# Patient Record
Sex: Male | Born: 1950 | Race: Black or African American | Hispanic: No | Marital: Married | Smoking: Never smoker
Health system: Southern US, Community
[De-identification: ages and names within clinical notes are randomized; demographics above are authoritative.]

## PROBLEM LIST (undated history)

## (undated) DIAGNOSIS — Z950 Presence of cardiac pacemaker: Secondary | ICD-10-CM

## (undated) DIAGNOSIS — Z95810 Presence of automatic (implantable) cardiac defibrillator: Secondary | ICD-10-CM

## (undated) DIAGNOSIS — I509 Heart failure, unspecified: Secondary | ICD-10-CM

## (undated) DIAGNOSIS — I428 Other cardiomyopathies: Secondary | ICD-10-CM

## (undated) DIAGNOSIS — U071 COVID-19: Secondary | ICD-10-CM

## (undated) DIAGNOSIS — I480 Paroxysmal atrial fibrillation: Secondary | ICD-10-CM

## (undated) DIAGNOSIS — J9611 Chronic respiratory failure with hypoxia: Secondary | ICD-10-CM

## (undated) DIAGNOSIS — N183 Chronic kidney disease, stage 3 unspecified: Secondary | ICD-10-CM

## (undated) DIAGNOSIS — I10 Essential (primary) hypertension: Secondary | ICD-10-CM

## (undated) DIAGNOSIS — G4733 Obstructive sleep apnea (adult) (pediatric): Secondary | ICD-10-CM

## (undated) HISTORY — PX: PACEMAKER IMPLANT: EP1218

---

## 2007-02-15 ENCOUNTER — Emergency Department (HOSPITAL_COMMUNITY): Admission: EM | Admit: 2007-02-15 | Discharge: 2007-02-15 | Payer: Self-pay | Admitting: Emergency Medicine

## 2011-03-13 LAB — CBC
Hemoglobin: 14.7
RBC: 5.61
RDW: 16 — ABNORMAL HIGH

## 2011-03-13 LAB — DIFFERENTIAL
Basophils Absolute: 0.1
Lymphocytes Relative: 26
Monocytes Absolute: 1.2 — ABNORMAL HIGH
Neutro Abs: 5.7

## 2011-03-13 LAB — I-STAT 8, (EC8 V) (CONVERTED LAB)
BUN: 14
Bicarbonate: 30.5 — ABNORMAL HIGH
Glucose, Bld: 83
HCT: 50
Operator id: 146091
pCO2, Ven: 59.8 — ABNORMAL HIGH
pH, Ven: 7.316 — ABNORMAL HIGH

## 2011-03-13 LAB — POCT CARDIAC MARKERS
CKMB, poc: 2
Myoglobin, poc: 98.4
Troponin i, poc: <0.05

## 2019-04-19 ENCOUNTER — Emergency Department (HOSPITAL_COMMUNITY): Payer: No Typology Code available for payment source

## 2019-04-19 ENCOUNTER — Other Ambulatory Visit: Payer: Self-pay

## 2019-04-19 ENCOUNTER — Inpatient Hospital Stay (HOSPITAL_COMMUNITY)
Admission: EM | Admit: 2019-04-19 | Discharge: 2019-04-25 | DRG: 177 | Disposition: A | Discharge status: Home Health | Payer: No Typology Code available for payment source | Attending: Internal Medicine | Admitting: Internal Medicine

## 2019-04-19 ENCOUNTER — Encounter (HOSPITAL_COMMUNITY): Payer: Self-pay | Admitting: Emergency Medicine

## 2019-04-19 DIAGNOSIS — I110 Hypertensive heart disease with heart failure: Secondary | ICD-10-CM | POA: Diagnosis present

## 2019-04-19 DIAGNOSIS — U071 COVID-19: Secondary | ICD-10-CM | POA: Diagnosis not present

## 2019-04-19 DIAGNOSIS — R55 Syncope and collapse: Secondary | ICD-10-CM

## 2019-04-19 DIAGNOSIS — R0602 Shortness of breath: Secondary | ICD-10-CM

## 2019-04-19 DIAGNOSIS — J9601 Acute respiratory failure with hypoxia: Secondary | ICD-10-CM | POA: Diagnosis present

## 2019-04-19 DIAGNOSIS — R7401 Elevation of levels of liver transaminase levels: Secondary | ICD-10-CM

## 2019-04-19 DIAGNOSIS — J1289 Other viral pneumonia: Secondary | ICD-10-CM | POA: Diagnosis present

## 2019-04-19 DIAGNOSIS — I5022 Chronic systolic (congestive) heart failure: Secondary | ICD-10-CM | POA: Diagnosis present

## 2019-04-19 DIAGNOSIS — E861 Hypovolemia: Secondary | ICD-10-CM | POA: Diagnosis present

## 2019-04-19 DIAGNOSIS — N289 Disorder of kidney and ureter, unspecified: Secondary | ICD-10-CM

## 2019-04-19 DIAGNOSIS — Z6838 Body mass index (BMI) 38.0-38.9, adult: Secondary | ICD-10-CM

## 2019-04-19 DIAGNOSIS — Z8249 Family history of ischemic heart disease and other diseases of the circulatory system: Secondary | ICD-10-CM

## 2019-04-19 DIAGNOSIS — I951 Orthostatic hypotension: Secondary | ICD-10-CM | POA: Diagnosis present

## 2019-04-19 DIAGNOSIS — R17 Unspecified jaundice: Secondary | ICD-10-CM

## 2019-04-19 DIAGNOSIS — D696 Thrombocytopenia, unspecified: Secondary | ICD-10-CM | POA: Diagnosis present

## 2019-04-19 DIAGNOSIS — E669 Obesity, unspecified: Secondary | ICD-10-CM | POA: Diagnosis present

## 2019-04-19 DIAGNOSIS — Z79899 Other long term (current) drug therapy: Secondary | ICD-10-CM

## 2019-04-19 DIAGNOSIS — R197 Diarrhea, unspecified: Secondary | ICD-10-CM | POA: Diagnosis present

## 2019-04-19 DIAGNOSIS — Z95810 Presence of automatic (implantable) cardiac defibrillator: Secondary | ICD-10-CM

## 2019-04-19 DIAGNOSIS — N179 Acute kidney failure, unspecified: Secondary | ICD-10-CM

## 2019-04-19 HISTORY — DX: Presence of automatic (implantable) cardiac defibrillator: Z95.810

## 2019-04-19 HISTORY — DX: Essential (primary) hypertension: I10

## 2019-04-19 HISTORY — DX: Presence of cardiac pacemaker: Z95.0

## 2019-04-19 LAB — COMPREHENSIVE METABOLIC PANEL
ALT: 35 U/L (ref 0–44)
AST: 48 U/L — ABNORMAL HIGH (ref 15–41)
Albumin: 3.5 g/dL (ref 3.5–5.0)
Alkaline Phosphatase: 62 U/L (ref 38–126)
Anion gap: 14 (ref 5–15)
BUN: 22 mg/dL (ref 8–23)
CO2: 25 mmol/L (ref 22–32)
Calcium: 8.7 mg/dL — ABNORMAL LOW (ref 8.9–10.3)
Chloride: 100 mmol/L (ref 98–111)
Creatinine, Ser: 2.69 mg/dL — ABNORMAL HIGH (ref 0.61–1.24)
GFR calc Af Amer: 27 mL/min — ABNORMAL LOW (ref >60)
GFR calc non Af Amer: 23 mL/min — ABNORMAL LOW (ref >60)
Glucose, Bld: 143 mg/dL — ABNORMAL HIGH (ref 70–99)
Potassium: 4.4 mmol/L (ref 3.5–5.1)
Sodium: 139 mmol/L (ref 135–145)
Total Bilirubin: 1.3 mg/dL — ABNORMAL HIGH (ref 0.3–1.2)
Total Protein: 7.1 g/dL (ref 6.5–8.1)

## 2019-04-19 LAB — CBC WITH DIFFERENTIAL/PLATELET
Abs Immature Granulocytes: 0.02 10*3/uL (ref 0.00–0.07)
Basophils Absolute: 0 10*3/uL (ref 0.0–0.1)
Basophils Relative: 0 %
Eosinophils Absolute: 0 10*3/uL (ref 0.0–0.5)
Eosinophils Relative: 0 %
HCT: 48.6 % (ref 39.0–52.0)
Hemoglobin: 15.3 g/dL (ref 13.0–17.0)
Immature Granulocytes: 0 %
Lymphocytes Relative: 31 %
Lymphs Abs: 2 10*3/uL (ref 0.7–4.0)
MCH: 26.9 pg (ref 26.0–34.0)
MCHC: 31.5 g/dL (ref 30.0–36.0)
MCV: 85.6 fL (ref 80.0–100.0)
Monocytes Absolute: 0.7 10*3/uL (ref 0.1–1.0)
Monocytes Relative: 10 %
Neutro Abs: 3.8 10*3/uL (ref 1.7–7.7)
Neutrophils Relative %: 59 %
Platelets: 107 10*3/uL — ABNORMAL LOW (ref 150–400)
RBC: 5.68 MIL/uL (ref 4.22–5.81)
RDW: 15 % (ref 11.5–15.5)
WBC: 6.5 10*3/uL (ref 4.0–10.5)
nRBC: 0 % (ref 0.0–0.2)

## 2019-04-19 LAB — CBC
HCT: 44.7 % (ref 39.0–52.0)
Hemoglobin: 13.8 g/dL (ref 13.0–17.0)
MCH: 26.7 pg (ref 26.0–34.0)
MCHC: 30.9 g/dL (ref 30.0–36.0)
MCV: 86.6 fL (ref 80.0–100.0)
Platelets: 92 10*3/uL — ABNORMAL LOW (ref 150–400)
RBC: 5.16 MIL/uL (ref 4.22–5.81)
RDW: 15.1 % (ref 11.5–15.5)
WBC: 4.8 10*3/uL (ref 4.0–10.5)
nRBC: 0 % (ref 0.0–0.2)

## 2019-04-19 LAB — BASIC METABOLIC PANEL WITH GFR
Anion gap: 10 (ref 5–15)
BUN: 23 mg/dL (ref 8–23)
CO2: 24 mmol/L (ref 22–32)
Calcium: 8.1 mg/dL — ABNORMAL LOW (ref 8.9–10.3)
Chloride: 104 mmol/L (ref 98–111)
Creatinine, Ser: 2.05 mg/dL — ABNORMAL HIGH (ref 0.61–1.24)
GFR calc Af Amer: 37 mL/min — ABNORMAL LOW
GFR calc non Af Amer: 32 mL/min — ABNORMAL LOW
Glucose, Bld: 101 mg/dL — ABNORMAL HIGH (ref 70–99)
Potassium: 3.9 mmol/L (ref 3.5–5.1)
Sodium: 138 mmol/L (ref 135–145)

## 2019-04-19 LAB — URINALYSIS, ROUTINE W REFLEX MICROSCOPIC
Bilirubin Urine: NEGATIVE
Glucose, UA: NEGATIVE mg/dL
Hgb urine dipstick: NEGATIVE
Ketones, ur: NEGATIVE mg/dL
Nitrite: NEGATIVE
Protein, ur: 30 mg/dL — AB
Specific Gravity, Urine: 1.016 (ref 1.005–1.030)
pH: 5 (ref 5.0–8.0)

## 2019-04-19 LAB — PROTIME-INR
INR: 1.1 (ref 0.8–1.2)
Prothrombin Time: 13.8 seconds (ref 11.4–15.2)

## 2019-04-19 LAB — TROPONIN I (HIGH SENSITIVITY)
Troponin I (High Sensitivity): 51 ng/L — ABNORMAL HIGH (ref <18)
Troponin I (High Sensitivity): 44 ng/L — ABNORMAL HIGH (ref <18)

## 2019-04-19 LAB — SARS CORONAVIRUS 2 (TAT 6-24 HRS): SARS Coronavirus 2: POSITIVE — AB

## 2019-04-19 LAB — LACTIC ACID, PLASMA
Lactic Acid, Venous: 1.5 mmol/L (ref 0.5–1.9)
Lactic Acid, Venous: 1 mmol/L (ref 0.5–1.9)

## 2019-04-19 LAB — HIV ANTIBODY (ROUTINE TESTING W REFLEX): HIV Screen 4th Generation wRfx: NONREACTIVE — AB

## 2019-04-19 MED ORDER — HEPARIN SODIUM (PORCINE) 5000 UNIT/ML IJ SOLN
5000.0000 [IU] | Freq: Three times a day (TID) | INTRAMUSCULAR | Status: DC
  Administered 2019-04-19 – 2019-04-25 (×18): 5000 [IU] via SUBCUTANEOUS
  Filled 2019-04-19 (×17): qty 1

## 2019-04-19 MED ORDER — SODIUM CHLORIDE 0.9 % IV BOLUS
1000.0000 mL | Freq: Once | INTRAVENOUS | Status: AC
  Administered 2019-04-19: 1000 mL via INTRAVENOUS

## 2019-04-19 MED ORDER — ONDANSETRON HCL 4 MG/2ML IJ SOLN: 4.0000 mg | Freq: Four times a day (QID) | INTRAMUSCULAR | Status: DC | PRN

## 2019-04-19 MED ORDER — ACETAMINOPHEN 650 MG RE SUPP: 650.0000 mg | Freq: Four times a day (QID) | RECTAL | Status: DC | PRN

## 2019-04-19 MED ORDER — ACETAMINOPHEN 325 MG PO TABS
650.0000 mg | ORAL_TABLET | Freq: Four times a day (QID) | ORAL | Status: DC | PRN
  Administered 2019-04-21 – 2019-04-23 (×5): 650 mg via ORAL
  Filled 2019-04-19 (×5): qty 2

## 2019-04-19 MED ORDER — ONDANSETRON HCL 4 MG PO TABS: 4.0000 mg | ORAL_TABLET | Freq: Four times a day (QID) | ORAL | Status: DC | PRN

## 2019-04-19 NOTE — ED Notes (Signed)
Pt placement called by this RN to ensure pt was in isolation room d/t pt not having isolation orders. Pt placement taking bed on 3E away.

## 2019-04-19 NOTE — ED Notes (Signed)
Patient called out stating he thought he had diarrhea and had soiled himself, pt assisted to the restroom and given wash cloths to clean up with.

## 2019-04-19 NOTE — ED Triage Notes (Addendum)
Pt arrives via [facility] from home for c/o "cold symptoms" that began Thursday and resolved Saturday. Pt endorses decreased appetite and lethargy since Monday. Ems reports they were called out for syncope and pt was lethargic upon arrival and very hypotensive with pressure in the 50s, increased to the 90s with fluids. Hx of the same. Pt has permanent pacemaker/defib in place. Received 700cc NS pta. cbg 166. A/ox4, resp e/u, nad. Denies any recent defibrillator shocks.

## 2019-04-19 NOTE — ED Notes (Signed)
Diet tray ordered 

## 2019-04-19 NOTE — ED Notes (Signed)
ED Provider at bedside. 

## 2019-04-19 NOTE — ED Provider Notes (Signed)
[facility] EMERGENCY DEPARTMENT Provider Note   CSN: [id number] Arrival date & time: 04/19/19  0449    History   Chief Complaint Chief Complaint  Patient presents with  . Loss of Consciousness    HPI Evan Serrano is a 68 y.o. male.   The history is provided by the patient.  Loss of Consciousness He has an implanted cardiac defibrillator and comes in following a syncopal episode at home.  He states that he has been getting dizzy for the last 4 days.  Dizziness is worse when he stands up.  He got up to take his wife to work and passed out.  He denies any chest pain, heaviness, tightness, pressure.  He denies any palpitations.  He did not feel his defibrillator go off.  Also, for the last 4 days, he has had anorexia and has not eaten anything solid although he has been drinking fluids.  He states he just has not been hungry.  He denies fever, chills, sweats.  There has been some nausea but no vomiting.  He denies diaphoresis.  Past Medical History:  Diagnosis Date  . Cardiac pacemaker   . Presence of cardiac defibrillator     There are no active problems to display for this patient.   History reviewed. No pertinent surgical history.      Home Medications    Prior to Admission medications   Not on File    Family History No family history on file.  Social History Social History   Tobacco Use  . Smoking status: Not on file  Substance Use Topics  . Alcohol use: Not on file  . Drug use: Not on file     Allergies   Patient has no allergy information on record.   Review of Systems Review of Systems  Cardiovascular: Positive for syncope.  All other systems reviewed and are negative.    Physical Exam Updated Vital Signs BP 90/65   Pulse 68   Temp 98.1 F (36.7 C) (Oral)   Resp 15   SpO2 98%   Physical Exam Vitals signs and nursing note reviewed.    68 year old male, resting comfortably and in no acute distress. Vital signs  are significant for low blood pressure. Oxygen saturation is 98%, which is normal. Head is normocephalic and atraumatic. PERRLA, EOMI. Oropharynx is clear.  Conjunctivae are pink. Neck is nontender and supple without adenopathy or JVD. Back is nontender and there is no CVA tenderness. Lungs are clear without rales, wheezes, or rhonchi. Chest is nontender. Heart has regular rate and rhythm without murmur. Abdomen is soft, flat, nontender without masses or hepatosplenomegaly and peristalsis is normoactive. Extremities have no cyanosis or edema, full range of motion is present. Skin is warm and dry without rash. Neurologic: Mental status is normal, cranial nerves are intact, there are no motor or sensory deficits.  ED Treatments / Results  Labs (all labs ordered are listed, but only abnormal results are displayed) Labs Reviewed - No data to display  EKG EKG Interpretation  Date/Time:  Tuesday April 19 2019 04:54:01 EST Ventricular Rate:  68 PR Interval:    QRS Duration: 114 QT Interval:  399 QTC Calculation: 425 R Axis:   -41 Text Interpretation: Sinus rhythm Incomplete left bundle branch block Abnormal inferior Q waves Consider anterior infarct When compared with ECG of 02/15/2007, No significant change was found Confirmed by Dione Booze ([id number]) on 04/19/2019 5:00:09 AM   Radiology No results found.  Procedures Procedures  Medications Ordered in ED Medications - No data to display   Initial Impression / Assessment and Plan / ED Course  I have reviewed the triage vital signs and the nursing notes.  Pertinent labs & imaging results that were available during my care of the patient were reviewed by me and considered in my medical decision making (see chart for details).  Syncope.  Anorexia.  Cause is not clear.  Will check screening labs.  ECG shows incomplete left bundle branch block.  He has no prior records in the [facility] health system or in [place] in [place] ([facility]  [facility] [place]  Orthostatic vital signs show fairly dramatic drop in blood pressure, and syncope may have been due to orthostasis.  Lactic acid level is normal.  Labs are significant for moderately elevated creatinine.  Unfortunately, no prior values are available so it is not clear if this represents an acute kidney injury.  BUN is normal which would argue against dehydration as cause for renal failure, but will give IV fluids.  Chest x-ray showed no acute changes.  Case is discussed with Dr. Koleen Distance of internal medicine teaching service who agrees to admit the patient.  Final Clinical Impressions(s) / ED Diagnoses   Final diagnoses:  Syncope, unspecified syncope type  Orthostatic hypotension  Renal insufficiency  Elevated AST (SGOT)  Serum total bilirubin elevated    ED Discharge Orders    None       Delora Fuel, MD 37/85/88 [phone]

## 2019-04-19 NOTE — ED Notes (Signed)
ED TO INPATIENT HANDOFF REPORT  ED Nurse Name and Phone #: [phone] Wendie Simmer., RN  S Name/Age/Gender Evan Serrano 68 y.o. male Room/Bed: 013C/013C  Code Status   Code Status: Full Code  Home/SNF/Other Home Patient oriented to: self, place, time and situation Is this baseline? Yes   Triage Complete: Triage complete  Chief Complaint syncope  Triage Note Pt arrives via [facility] from home for c/o "cold symptoms" that began Thursday and resolved Saturday. Pt endorses decreased appetite and lethargy since Monday. Ems reports they were called out for syncope and pt was lethargic upon arrival and very hypotensive with pressure in the 50s, increased to the 90s with fluids. Hx of the same. Pt has permanent pacemaker/defib in place. Received 700cc NS pta. cbg 166. A/ox4, resp e/u, nad. Denies any recent defibrillator shocks.   Allergies No Known Allergies  Level of Care/Admitting Diagnosis ED Disposition    ED Disposition Condition Comment   Admit  Hospital Area: [facility] [[id number]]  Level of Care: Telemetry Medical [104]  Covid Evaluation: Asymptomatic Screening Protocol (No Symptoms)  Diagnosis: Orthostatic hypotension [458.0.ICD-9-CM]  Admitting Physician: Reymundo Poll [[id number]]  Attending Physician: Reymundo Poll [[id number]]  PT Class (Do Not Modify): Observation [104]  PT Acc Code (Do Not Modify): Observation [10022]       B Medical/Surgery History Past Medical History:  Diagnosis Date  . Cardiac pacemaker   . Presence of cardiac defibrillator    History reviewed. No pertinent surgical history.   A IV Location/Drains/Wounds Patient Lines/Drains/Airways Status   Active Line/Drains/Airways    Name:   Placement date:   Placement time:   Site:   Days:   Peripheral IV 04/19/19 Left Antecubital   04/19/19    0500    Antecubital   less than 1          Intake/Output Last 24 hours  Intake/Output Summary (Last 24 hours) at 04/19/2019  1917 Last data filed at 04/19/2019 1215 Gross per 24 hour  Intake 1000 ml  Output 200 ml  Net 800 ml    Labs/Imaging Results for orders placed or performed during the hospital encounter of 04/19/19 (from the past 48 hour(s))  Comprehensive metabolic panel     Status: Abnormal   Collection Time: 04/19/19  4:59 AM  Result Value Ref Range   Sodium 139 135 - 145 mmol/L   Potassium 4.4 3.5 - 5.1 mmol/L   Chloride 100 98 - 111 mmol/L   CO2 25 22 - 32 mmol/L   Glucose, Bld 143 (H) 70 - 99 mg/dL   BUN 22 8 - 23 mg/dL   Creatinine, Ser 2.12 (H) 0.61 - 1.24 mg/dL   Calcium 8.7 (L) 8.9 - 10.3 mg/dL   Total Protein 7.1 6.5 - 8.1 g/dL   Albumin 3.5 3.5 - 5.0 g/dL   AST 48 (H) 15 - 41 U/L   ALT 35 0 - 44 U/L   Alkaline Phosphatase 62 38 - 126 U/L   Total Bilirubin 1.3 (H) 0.3 - 1.2 mg/dL   GFR calc non Af Amer 23 (L) >60 mL/min   GFR calc Af Amer 27 (L) >60 mL/min   Anion gap 14 5 - 15    Comment: Performed at [facility], [address], [place], [place] [address]  CBC with Differential     Status: Abnormal   Collection Time: 04/19/19  4:59 AM  Result Value Ref Range   WBC 6.5 4.0 - 10.5 K/uL   RBC 5.68  4.22 - 5.81 MIL/uL   Hemoglobin 15.3 13.0 - 17.0 g/dL   HCT 40.948.6 81.139.0 - 91.452.0 %   MCV 85.6 80.0 - 100.0 fL   MCH 26.9 26.0 - 34.0 pg   MCHC 31.5 30.0 - 36.0 g/dL   RDW 78.215.0 95.611.5 - 21.315.5 %   Platelets 107 (L) 150 - 400 K/uL    Comment: REPEATED TO VERIFY PLATELET COUNT CONFIRMED BY SMEAR SPECIMEN CHECKED FOR CLOTS    nRBC 0.0 0.0 - 0.2 %   Neutrophils Relative % 59 %   Neutro Abs 3.8 1.7 - 7.7 K/uL   Lymphocytes Relative 31 %   Lymphs Abs 2.0 0.7 - 4.0 K/uL   Monocytes Relative 10 %   Monocytes Absolute 0.7 0.1 - 1.0 K/uL   Eosinophils Relative 0 %   Eosinophils Absolute 0.0 0.0 - 0.5 K/uL   Basophils Relative 0 %   Basophils Absolute 0.0 0.0 - 0.1 K/uL   Immature Granulocytes 0 %   Abs Immature Granulocytes 0.02 0.00 - 0.07 K/uL    Comment: Performed at [facility], [address] [place], [place] [address]  Troponin I (High Sensitivity)     Status: Abnormal   Collection Time: 04/19/19  4:59 AM  Result Value Ref Range   Troponin I (High Sensitivity) 51 (H) <18 ng/L    Comment: (NOTE) Elevated high sensitivity troponin I (hsTnI) values and significant  changes across serial measurements may suggest ACS but many other  chronic and acute conditions are known to elevate hsTnI results.  Refer to the "Links" section for chest pain algorithms and additional  guidance. Performed at [facility], [address] [place], [place] [address]   Lactic acid, plasma     Status: None   Collection Time: 04/19/19  5:05 AM  Result Value Ref Range   Lactic Acid, Venous 1.5 0.5 - 1.9 mmol/L    Comment: Performed at [facility], [address] [place], [place] [address]  Lactic acid, plasma     Status: None   Collection Time: 04/19/19  7:58 AM  Result Value Ref Range   Lactic Acid, Venous 1.0 0.5 - 1.9 mmol/L    Comment: Performed at [facility], [address] [place], [place] [address]  Troponin I (High Sensitivity)     Status: Abnormal   Collection Time: 04/19/19  7:58 AM  Result Value Ref Range   Troponin I (High Sensitivity) 44 (H) <18 ng/L    Comment: (NOTE) Elevated high sensitivity troponin I (hsTnI) values and significant  changes across serial measurements may suggest ACS but many other  chronic and acute conditions are known to elevate hsTnI results.  Refer to the "Links" section for chest pain algorithms and additional  guidance. Performed at [facility], [address] [place], [place] [address]   HIV Antibody (routine testing w rflx)     Status: Abnormal   Collection Time: 04/19/19  8:28 AM  Result Value Ref Range   HIV Screen 4th Generation wRfx Non Reactive (A) Non Reactive    Comment: (NOTE) Performed At: [facility] [address] [place], [place] [id number] Jolene SchimkeNagendra Sanjai MD  HK:[id number]:[phone]   SARS CORONAVIRUS 2 (TAT 6-24 HRS) Nasopharyngeal Nasopharyngeal Swab     Status: Abnormal   Collection Time: 04/19/19  8:29 AM   Specimen: Nasopharyngeal Swab  Result Value Ref Range   SARS Coronavirus 2 POSITIVE (A) NEGATIVE    Comment: RESULT CALLED  TO, READ BACK BY AND VERIFIED WITH: Adora Fridge 1729 04/19/2019 D BRADLEY (NOTE) SARS-CoV-2 target nucleic acids are DETECTED. The SARS-CoV-2 RNA is generally detectable in upper and lower respiratory specimens during the acute phase of infection. Positive results are indicative of active infection with SARS-CoV-2. Clinical  correlation with patient history and other diagnostic information is necessary to determine patient infection status. Positive results do  not rule out bacterial infection or co-infection with other viruses. The expected result is Negative. Fact Sheet for Patients: SugarRoll.be Fact Sheet for Healthcare Providers: https://www.woods-mathews.com/ This test is not yet approved or cleared by the [place] FDA and  has been authorized for detection and/or diagnosis of SARS-CoV-2 by FDA under an Emergency Use Authorization (EUA). This EUA will remain  in effect (meaning this test can be used) for  the duration of the COVID-19 declaration under Section 564(b)(1) of the Act, 21 U.S.C. section 360bbb-3(b)(1), unless the authorization is terminated or revoked sooner. Performed at [facility], [place] [address], [place], [place] [address]   Urinalysis, Routine w reflex microscopic     Status: Abnormal   Collection Time: 04/19/19 11:41 AM  Result Value Ref Range   Color, Urine YELLOW YELLOW   APPearance HAZY (A) CLEAR   Specific Gravity, Urine 1.016 1.005 - 1.030   pH 5.0 5.0 - 8.0   Glucose, UA NEGATIVE NEGATIVE mg/dL   Hgb urine dipstick NEGATIVE NEGATIVE   Bilirubin Urine NEGATIVE NEGATIVE   Ketones, ur NEGATIVE NEGATIVE mg/dL   Protein, ur 30 (A)  NEGATIVE mg/dL   Nitrite NEGATIVE NEGATIVE   Leukocytes,Ua SMALL (A) NEGATIVE   RBC / HPF 0-5 0 - 5 RBC/hpf   WBC, UA 21-50 0 - 5 WBC/hpf   Bacteria, UA RARE (A) NONE SEEN   Squamous Epithelial / LPF 0-5 0 - 5   Mucus PRESENT    Hyaline Casts, UA PRESENT     Comment: Performed at [facility], [place] [address], [place], [place] [address]  Basic metabolic panel     Status: Abnormal   Collection Time: 04/19/19 11:41 AM  Result Value Ref Range   Sodium 138 135 - 145 mmol/L   Potassium 3.9 3.5 - 5.1 mmol/L   Chloride 104 98 - 111 mmol/L   CO2 24 22 - 32 mmol/L   Glucose, Bld 101 (H) 70 - 99 mg/dL   BUN 23 8 - 23 mg/dL   Creatinine, Ser 2.05 (H) 0.61 - 1.24 mg/dL   Calcium 8.1 (L) 8.9 - 10.3 mg/dL   GFR calc non Af Amer 32 (L) >60 mL/min   GFR calc Af Amer 37 (L) >60 mL/min   Anion gap 10 5 - 15    Comment: Performed at [facility] [address] [place], [place] [address]  CBC     Status: Abnormal   Collection Time: 04/19/19 11:41 AM  Result Value Ref Range   WBC 4.8 4.0 - 10.5 K/uL   RBC 5.16 4.22 - 5.81 MIL/uL   Hemoglobin 13.8 13.0 - 17.0 g/dL   HCT 44.7 39.0 - 52.0 %   MCV 86.6 80.0 - 100.0 fL   MCH 26.7 26.0 - 34.0 pg   MCHC 30.9 30.0 - 36.0 g/dL   RDW 15.1 11.5 - 15.5 %   Platelets 92 (L) 150 - 400 K/uL    Comment: Immature Platelet Fraction may be clinically indicated, consider ordering this additional test [id number]    nRBC 0.0 0.0 - 0.2 %  Comment: Performed at [facility], [address] [place], [place] [address]  Protime-INR     Status: None   Collection Time: 04/19/19  2:30 PM  Result Value Ref Range   Prothrombin Time 13.8 11.4 - 15.2 seconds   INR 1.1 0.8 - 1.2    Comment: (NOTE) INR goal varies based on device and disease states. Performed at [facility], [address], [place], [place] [address]    Dg Chest Port 1 View  Result Date: 04/19/2019 CLINICAL DATA:  Syncope. EXAM: PORTABLE CHEST 1 VIEW COMPARISON:  None.  FINDINGS: Left-sided pacemaker in place. Normal heart size for portable AP technique. The cardiomediastinal contours are normal. The lungs are clear. Pulmonary vasculature is normal. No consolidation, pleural effusion, or pneumothorax. No acute osseous abnormalities are seen. IMPRESSION: Left-sided pacemaker in place. No acute abnormality. Electronically Signed   By: Narda Rutherford M.D.   On: 04/19/2019 05:23    Pending Labs Unresulted Labs (From admission, onward)    Start     Ordered   04/20/19 0500  Basic metabolic panel  Tomorrow morning,   R     04/19/19 0808   04/20/19 0500  CBC  Tomorrow morning,   R     04/19/19 0808   04/19/19 1330  Pathologist smear review  Once,   STAT     04/19/19 1329          Vitals/Pain Today's Vitals   04/19/19 1600 04/19/19 1630 04/19/19 1800 04/19/19 1830  BP: 123/87 117/82 (!) 99/56 (!) 93/59  Pulse: 65 70 64 69  Resp: (!) 24 19 19  (!) 24  Temp:      TempSrc:      SpO2: 93% 94% 95% 92%  PainSc:        Isolation Precautions No active isolations  Medications Medications  heparin injection 5,000 Units (5,000 Units Subcutaneous Given 04/19/19 1353)  acetaminophen (TYLENOL) tablet 650 mg (has no administration in time range)    Or  acetaminophen (TYLENOL) suppository 650 mg (has no administration in time range)  ondansetron (ZOFRAN) tablet 4 mg (has no administration in time range)    Or  ondansetron (ZOFRAN) injection 4 mg (has no administration in time range)  sodium chloride 0.9 % bolus 1,000 mL (0 mLs Intravenous Stopped 04/19/19 0829)    Mobility walks Moderate fall risk   Focused Assessments Cardiac Assessment Handoff:  Cardiac Rhythm: Normal sinus rhythm No results found for: CKTOTAL, CKMB, CKMBINDEX, TROPONINI No results found for: DDIMER Does the Patient currently have chest pain? No      R Recommendations: See Admitting Provider Note  Report given to:   Additional Notes:

## 2019-04-19 NOTE — H&P (Addendum)
Date: 04/19/2019               Patient Name:  Evan Serrano MRN: [id number]  DOB: 10-31-1950 Age / Sex: 68 y.o., male   [facility]         Medical Service: Internal Medicine Teaching Service         Attending Physician: Dr. Reymundo Poll, MD    First Contact: Dr. Yetta Barre Pager: [phone]  Second Contact: Dr. Dortha Schwalbe Pager: [phone]       After Hours (After 5p/  First Contact Pager: [phone]  weekends / holidays): Second Contact Pager: [phone]   Chief Complaint: Dizziness  History of Present Illness: Mckenzie Toruno is a 68 y.o male with HFrEF s/p ICD placement who presented to the ED after a syncopal episode on 11/16. History was obtained via the patient and through chart review.   Patient was in his normal state of health until Friday evening when he developed cough and nasal congestion. He states that this progressed to generalized myalgias and arthralgias. He then developed decreased PO intake that persisted on Saturday, Sunday, and Monday. He states that he has had no appetite to consume solid foods and has been trying to stay hydrated by drinking juices and tea. On Sunday he noticed dizziness with position change and on Monday when he got up at 3 PM to take his wife to work he subsequently syncopized when attempting to stand up. Prior to the loss of consciousness he denies palpitations, visual changes, chest pain, shortness of breath. He does have an ICD in place and states that he did not get any form of shock. He does not know the brand of ICD. His wife subsequently called EMS and EMS brought him to the emergency department. In addition to the above symptoms review of systems is positive for diarrhea. He has been taking all his outpatient medications as prescribed which includes carvedilol BID, spironolactone 25 mg daily, torsemide 20 mg daily, and lisinopril 10 mg daily. He has been taking all these medications as prescribed.  On review of records EMS was  called to the patient's house and when they arrived they found the patient lethargic and hypertensive with pressures in the 50s. This subsequently improved to the 90s with fluids. He received 700 mL of normal saline in the field. CBG was 166.  Meds:  Carvedilol unknown dose BID, spironolactone 25 mg daily, torsemide 20 mg daily, and lisinopril 10 mg daily.  Allergies: Allergies as of 04/19/2019  . (No Known Allergies)   Past Medical History:  Diagnosis Date  . Cardiac pacemaker   . 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 Prior to this he was in the [employer]. After retiring in [place] he moved to [place] worked at [employer] and subsequently moved to [place] in 2017. Since that time he has been working at [employer] with his wife. He and his wife have two children with several grandchildren. He is a never smoker/never drinker. He denies the use of illicit substances.  Review of Systems: A complete ROS was negative except as per HPI.   Physical Exam: Blood pressure 95/62, pulse (!) 47, temperature 98.1 F (36.7 C), temperature source Oral, resp. rate 20, SpO2 96 %.   Orthostatic vitals Lying: BP 99/76 HR 66  Sitting: BP 80/54 HR 70  Standing: BP 63/45 HR 73  General: Obese male in no acute distress HENT: Normocephalic, atraumatic, dry mucus membranes Pulm: Good air movement with no wheezing or crackles  CV: RRR, no murmurs, no rubs  Abdomen: Active bowel sounds, soft, distended, no tenderness to palpation  Extremities: Pulses palpable in all extremities, no LE edema  Skin: Warm and dry, increased turgor   Neuro: Alert and oriented x 3  POCUS: Parasternal long view without obvious pericardial effusion. Unrestricted aortic and mitral valve motion without regurgitation. EPSS > 1cm with reduced systolic  function. Unable to visualize IVC or subxiphoid view due to protuberant abdomen.   EKG: personally reviewed my interpretation is sinus rhythm with PR prolongation and borderline QRS prolongation. No ST elevation or T-wave compressions. No prior EKG to compare.  CXR: personally reviewed my interpretation is good rotation and inspiration. Slightly underpenetrated. Left-sided pacemaker/ICD in place. No mediastinal lining. No apparent pleural effusions. No infiltrates or opacities. Slight prominence of the pulmonary vasculature.  Assessment & Plan by Problem: Principal Problem:   Syncope due to orthostatic hypotension Active Problems:   AKI (acute kidney injury) ([place])   Chronic HFrEF (heart failure with reduced ejection fraction) ([facility])  Aerik Polan is a 68 y.o male with HFrEF s/p ICD placement who presented to the ED after a syncopal episode on 11/16. He was found to be significantly hypotensive and orthostatic positive. He was subsequently admitted for further evaluation and management.   Syncope 2/2 orthostatic hypotension  - Orthostatic hypotension likely secondary to continued diuretic and antihypertensive use in the setting of poor PO intake. - Bedside cardiac POCUS performed without obvious causes for cardiogenic or obstructive shock. - No leukocytosis or other infectious symptoms to suggest distributive shock  - [facility]  - Will hold Carvedilol unknown dose BID, spironolactone 25 mg daily, torsemide 20 mg daily, and lisinopril 10 mg daily - Has received 2L NS thus far. Hold further fluids given HFrEF. - Repeat orthostatics in the AM  Possible AKI - Unknown baseline creatinine, [facility].  - Likely hemodynamically mediated given HPI  - Recheck BMP at noon, if creatinine has not improved with get urine studies and renal ultrasound  - Obtain UA   HFrEF s/p ICD placement  - Appears hypovolemic on PE  -  Hold guideline direct medical therapies; Carvedilol unknown dose BID, spironolactone 25 mg daily, and lisinopril 10 mg daily  - Monitor volume status   Thrombocytopenia  - Unclear if this is chronic  - Will repeat this afternoon   VTE ppx: SubQ Heparin  Diet: Heart healthy  CODE STATUS: Full code  Dispo: Admit patient to Observation with expected length of stay less than 2 midnights.  Signed: Ina Homes, MD 04/19/2019, 8:28 AM  Pager: My Pager: [phone]

## 2019-04-19 NOTE — ED Notes (Signed)
Patient placed on 3L O2 via [place] for O2 sat of 89-90% while asleep. O2 sat improved to 100%

## 2019-04-19 NOTE — ED Notes (Signed)
Admitting at bedside 

## 2019-04-20 DIAGNOSIS — D696 Thrombocytopenia, unspecified: Secondary | ICD-10-CM

## 2019-04-20 DIAGNOSIS — J9601 Acute respiratory failure with hypoxia: Secondary | ICD-10-CM | POA: Diagnosis present

## 2019-04-20 DIAGNOSIS — U071 COVID-19: Secondary | ICD-10-CM | POA: Diagnosis present

## 2019-04-20 DIAGNOSIS — Z6838 Body mass index (BMI) 38.0-38.9, adult: Secondary | ICD-10-CM | POA: Diagnosis not present

## 2019-04-20 DIAGNOSIS — I951 Orthostatic hypotension: Secondary | ICD-10-CM | POA: Diagnosis present

## 2019-04-20 DIAGNOSIS — Z95810 Presence of automatic (implantable) cardiac defibrillator: Secondary | ICD-10-CM | POA: Diagnosis not present

## 2019-04-20 DIAGNOSIS — Z79899 Other long term (current) drug therapy: Secondary | ICD-10-CM | POA: Diagnosis not present

## 2019-04-20 DIAGNOSIS — E669 Obesity, unspecified: Secondary | ICD-10-CM | POA: Diagnosis present

## 2019-04-20 DIAGNOSIS — I5022 Chronic systolic (congestive) heart failure: Secondary | ICD-10-CM | POA: Diagnosis present

## 2019-04-20 DIAGNOSIS — I110 Hypertensive heart disease with heart failure: Secondary | ICD-10-CM

## 2019-04-20 DIAGNOSIS — Z8249 Family history of ischemic heart disease and other diseases of the circulatory system: Secondary | ICD-10-CM | POA: Diagnosis not present

## 2019-04-20 DIAGNOSIS — E861 Hypovolemia: Secondary | ICD-10-CM | POA: Diagnosis present

## 2019-04-20 DIAGNOSIS — R55 Syncope and collapse: Secondary | ICD-10-CM | POA: Diagnosis present

## 2019-04-20 DIAGNOSIS — N179 Acute kidney failure, unspecified: Secondary | ICD-10-CM | POA: Diagnosis present

## 2019-04-20 DIAGNOSIS — N289 Disorder of kidney and ureter, unspecified: Secondary | ICD-10-CM | POA: Insufficient documentation

## 2019-04-20 DIAGNOSIS — I5020 Unspecified systolic (congestive) heart failure: Secondary | ICD-10-CM

## 2019-04-20 DIAGNOSIS — J1289 Other viral pneumonia: Secondary | ICD-10-CM | POA: Diagnosis present

## 2019-04-20 DIAGNOSIS — R197 Diarrhea, unspecified: Secondary | ICD-10-CM | POA: Diagnosis present

## 2019-04-20 LAB — BASIC METABOLIC PANEL WITH GFR
Anion gap: 9 (ref 5–15)
BUN: 17 mg/dL (ref 8–23)
CO2: 24 mmol/L (ref 22–32)
Calcium: 8.6 mg/dL — ABNORMAL LOW (ref 8.9–10.3)
Chloride: 105 mmol/L (ref 98–111)
Creatinine, Ser: 1.58 mg/dL — ABNORMAL HIGH (ref 0.61–1.24)
GFR calc Af Amer: 51 mL/min — ABNORMAL LOW (ref >60)
GFR calc non Af Amer: 44 mL/min — ABNORMAL LOW (ref >60)
Glucose, Bld: 101 mg/dL — ABNORMAL HIGH (ref 70–99)
Potassium: 3.7 mmol/L (ref 3.5–5.1)
Sodium: 138 mmol/L (ref 135–145)

## 2019-04-20 LAB — CBC
HCT: 43.6 % (ref 39.0–52.0)
Hemoglobin: 13.9 g/dL (ref 13.0–17.0)
MCH: 26.7 pg (ref 26.0–34.0)
MCHC: 31.9 g/dL (ref 30.0–36.0)
MCV: 83.7 fL (ref 80.0–100.0)
Platelets: 99 10*3/uL — ABNORMAL LOW (ref 150–400)
RBC: 5.21 MIL/uL (ref 4.22–5.81)
RDW: 14.8 % (ref 11.5–15.5)
WBC: 4.9 10*3/uL (ref 4.0–10.5)
nRBC: 0 % (ref 0.0–0.2)

## 2019-04-20 MED ORDER — LACTATED RINGERS IV BOLUS
500.0000 mL | Freq: Once | INTRAVENOUS | Status: AC
  Administered 2019-04-20: 500 mL via INTRAVENOUS

## 2019-04-20 MED ORDER — LACTATED RINGERS IV BOLUS
1000.0000 mL | Freq: Once | INTRAVENOUS | Status: AC
  Administered 2019-04-20: 1000 mL via INTRAVENOUS

## 2019-04-20 MED ORDER — ENSURE ENLIVE PO LIQD
237.0000 mL | Freq: Three times a day (TID) | ORAL | Status: DC
  Administered 2019-04-21 – 2019-04-24 (×9): 237 mL via ORAL
  Filled 2019-04-20: qty 237

## 2019-04-20 NOTE — ED Notes (Signed)
Breakfast Ordered 

## 2019-04-20 NOTE — ED Notes (Signed)
Pt in on carelink list for transport to [place], there are several people ahead of them

## 2019-04-20 NOTE — ED Notes (Signed)
Pt wife would like a call when pt gets a room [phone]

## 2019-04-20 NOTE — ED Notes (Signed)
Diet tray ordered 

## 2019-04-20 NOTE — Progress Notes (Signed)
  Date: 04/20/2019  Patient name: Evan Serrano  Medical record number: [id number]  Date of birth: 1950-12-31        I have seen and evaluated this patient and I have discussed the plan of care with the house staff.  S: No complaints this morning, still weak and dizzy when he sits up.  O: COVID 19 PCR resulted positive. Requiring 2L [place], desaturates to 89-90% when oxygen is weaned to room air.   Today's Vitals   04/20/19 0600 04/20/19 0630 04/20/19 0700 04/20/19 0715  BP: (!) 110/98 (!) 106/93 95/63   Pulse: 70 68 62 68  Resp: (!) 24 (!) 28 (!) 23 14  Temp:      TempSrc:      SpO2: 91% (!) 89%  90%  PainSc:       There is no height or weight on file to calculate BMI.  Physical Exam Constitutional: NAD, appears comfortable Cardiovascular: RRR, no murmurs, rubs, or gallops.  Pulmonary/Chest: Slight bibasilar crackles, otherwise CTAB and normal effort Abdominal: Soft, non tender, non distended. +BS.  Extremities: Warm and well perfused.  No edema.  Neurological: A&Ox3, CN II - XII grossly intact.  Skin: No rashes or erythema  Psychiatric: Normal mood and affect   Assessment: 68 yo M with a pmhx of HFrEF and HTN presenting after a syncopal episode from home in the setting of decreased PO intake and continuing to take his home HF medications. He was profoundly orthostatic on admission. He was received 2L IVF boluses thus far, persistently orthostatic this morning on my exam. 111/78 while laying flat --> 84/65 after sitting up in bed.  COVID-19 test resulted positive.   COVID-19 Infection: Will request transfer to [place]. Continue supplemental oxygen as needed. Mostly asymptomatic but requiring 2L of [place] to maintain saturations > 90%.   Orthostatic Hypotension: Patient reports decreased PO intake since losing his teeth. He does not have dentures and reports "picking" at food throughout the day. Encouraged PO fluid intake, will start ensure supplements. Continue IVFs, repeat orthostatics  this afternoon. Continue holding torsemide, carvedilol, spironolactone, and lisinopril.   Thrombocytopenia: Clinically not consistent with DIC, PT/INR are normal. Possible viral suppression with +COVID-19. Smear is pending, trend CBC.   Renal Dysfunction: Unclear baseline, last BMP 12 years ago. Improved this morning, SCr 2.69 -> 1.58. Monitor UOP, daily labs.    Velna Ochs, MD 04/20/2019, 7:43 AM

## 2019-04-21 ENCOUNTER — Encounter (HOSPITAL_COMMUNITY): Payer: Self-pay

## 2019-04-21 ENCOUNTER — Inpatient Hospital Stay (HOSPITAL_COMMUNITY): Payer: No Typology Code available for payment source

## 2019-04-21 DIAGNOSIS — I951 Orthostatic hypotension: Secondary | ICD-10-CM

## 2019-04-21 DIAGNOSIS — I5022 Chronic systolic (congestive) heart failure: Secondary | ICD-10-CM

## 2019-04-21 DIAGNOSIS — N179 Acute kidney failure, unspecified: Secondary | ICD-10-CM

## 2019-04-21 LAB — COMPREHENSIVE METABOLIC PANEL
ALT: 45 U/L — ABNORMAL HIGH (ref 0–44)
AST: 50 U/L — ABNORMAL HIGH (ref 15–41)
Albumin: 3.6 g/dL (ref 3.5–5.0)
Alkaline Phosphatase: 56 U/L (ref 38–126)
Anion gap: 10 (ref 5–15)
BUN: 11 mg/dL (ref 8–23)
CO2: 27 mmol/L (ref 22–32)
Calcium: 8.6 mg/dL — ABNORMAL LOW (ref 8.9–10.3)
Chloride: 101 mmol/L (ref 98–111)
Creatinine, Ser: 1.33 mg/dL — ABNORMAL HIGH (ref 0.61–1.24)
GFR calc Af Amer: >60 mL/min (ref >60)
GFR calc non Af Amer: 55 mL/min — ABNORMAL LOW (ref >60)
Glucose, Bld: 99 mg/dL (ref 70–99)
Potassium: 4.1 mmol/L (ref 3.5–5.1)
Sodium: 138 mmol/L (ref 135–145)
Total Bilirubin: 1 mg/dL (ref 0.3–1.2)
Total Protein: 7.4 g/dL (ref 6.5–8.1)

## 2019-04-21 LAB — FERRITIN: Ferritin: 416 ng/mL — ABNORMAL HIGH (ref 24–336)

## 2019-04-21 LAB — SAMPLE TO BLOOD BANK

## 2019-04-21 LAB — PATHOLOGIST SMEAR REVIEW

## 2019-04-21 LAB — CBC
HCT: 46 % (ref 39.0–52.0)
Hemoglobin: 14.6 g/dL (ref 13.0–17.0)
MCH: 26.7 pg (ref 26.0–34.0)
MCHC: 31.7 g/dL (ref 30.0–36.0)
MCV: 84.2 fL (ref 80.0–100.0)
Platelets: 99 10*3/uL — ABNORMAL LOW (ref 150–400)
RBC: 5.46 MIL/uL (ref 4.22–5.81)
RDW: 15 % (ref 11.5–15.5)
WBC: 3.5 10*3/uL — ABNORMAL LOW (ref 4.0–10.5)
nRBC: 0 % (ref 0.0–0.2)

## 2019-04-21 LAB — C-REACTIVE PROTEIN: CRP: 2.5 mg/dL — ABNORMAL HIGH (ref <1.0)

## 2019-04-21 LAB — D-DIMER, QUANTITATIVE: D-Dimer, Quant: 0.8 ug/mL-FEU — ABNORMAL HIGH (ref 0.00–0.50)

## 2019-04-21 MED ORDER — LOPERAMIDE HCL 2 MG PO CAPS
2.0000 mg | ORAL_CAPSULE | ORAL | Status: DC | PRN
  Administered 2019-04-21: 2 mg via ORAL
  Filled 2019-04-21: qty 1

## 2019-04-21 NOTE — Plan of Care (Signed)

## 2019-04-21 NOTE — Progress Notes (Signed)
Attempted to call patient wife for update. Reached voicemail, message left for return call and phone number provided.

## 2019-04-21 NOTE — Progress Notes (Signed)
PROGRESS NOTE                                                                                                                                                                                                             Patient Demographics:    Evan Serrano, is a 68 y.o. male, DOB - 05-08-1951, EYC:[id number]  [facility] the [facility]   Admit date - 04/19/2019   LOS - 1  Chief Complaint  Patient presents with  . Loss of Consciousness       Brief Narrative: Patient is a 68 y.o. male with PMHx of chronic systolic heart failure s/p AICD in place[facility]presented to the hospital on 11/7 with several days history of poor oral intake, diarrhea-and a syncopal episode in the setting of hypotension due to volume loss.  Found to have COVID-19.  See below for further details.   Subjective:    Evan Serrano today feels better-has had 2 episodes of loose stools already this morning.   Assessment  & Plan :   Syncope: Secondary to orthostatic hypotension in the setting of volume loss due to diarrhea.  Continue telemetry monitoring.  Recheck orthostatic vital signs.  COVID-19 infection: No pneumonia on repeat chest x-ray today-very minimal oxygen requirements this morning-I actually took him off oxygen while I was in the room-he stayed in the 90s throughout.  Inflammatory markers minimally elevated-suspect we can continue to watch him off steroids or remdesivir for now.    Fever: afebrile  O2 requirements: On RA to 1-2L/m  COVID-19 Labs: Recent Labs    04/21/19 0850  DDIMER 0.80*  FERRITIN 416*  CRP 2.5*    Lab Results  Component Value Date   SARSCOV2NAA POSITIVE (A) 04/19/2019     COVID-19 Medications: Steroids: Not given due to lack of significant hypoxia Remdesivir: Not given due to lack of significant hypoxia and no pneumonia on chest x-ray. Actemra: Not  indicated Convalescent Plasma: Not indicated  Other medications: Diuretics:Euvolemic-no need for lasix Antibiotics:Not needed as no evidence of bacterial infection  Prone/Incentive Spirometry: encouraged incentive spirometry use 3-4/hour.  DVT Prophylaxis  :   Heparin   Diarrhea secondary to COVID-19: Supportive care only.  On as needed Imodium.  AKI: Hemodynamically mediated due to diarrhea and the use of diuretics.  Improving.  Follow.  Deconditioning/debility: Secondary to acute illness-PT/OT ordered.  Recheck orthostatic vital signs today.  Chronic systolic heart failure: Euvolemic-continue to hold diuretics-we will follow BP trend and see if we can restart low-dose beta-blocker.  Thrombocytopenia: Secondary to COVID-19 in stable-follow periodically.  Obesity: Estimated body mass index is 38.38 kg/m as calculated from the following:   Height as of this encounter: 5\' 8"  (1.727 m).   Weight as of this encounter: 114.5 kg.   Consults  :  None  Procedures  :  None  ABG:    Component Value Date/Time   HCO3 30.5 (H) 02/15/2007 1543   TCO2 32 02/15/2007 1543    Vent Settings: N/A  Condition - Stable  Family Communication  :  Spouse updated over the phone  Code Status :  Full Code  Diet :  Diet Order            Diet Heart Room service appropriate? Yes; Fluid consistency: Thin  Diet effective now               Disposition Plan  :  Remain hospitalized  Barriers to discharge: Ongoing diarrhea-soft blood pressure-debility  Antimicorbials  :    Anti-infectives (From admission, onward)   None      Inpatient Medications  Scheduled Meds: . feeding supplement (ENSURE ENLIVE)  237 mL Oral TID BM  . heparin  5,000 Units Subcutaneous Q8H   Continuous Infusions: PRN Meds:.acetaminophen **OR** acetaminophen, loperamide, ondansetron **OR** ondansetron (ZOFRAN) IV   Time Spent in minutes  25  See all Orders from today for further details   Oren Binet  M.D on 04/21/2019 at 11:21 AM  To page go to [email or web address] - use universal password  [facility] -  Office  [phone]    Objective:   Vitals:   04/20/19 2105 04/20/19 2325 04/21/19 0605 04/21/19 0750  BP: 103/77 102/65 (!) 92/53 97/62  Pulse: 77 76 70 (!) 43  Resp: 20  18 15   Temp: 99.8 F (37.7 C) 99.4 F (37.4 C) 99.1 F (37.3 C) 99.2 F (37.3 C)  TempSrc: Oral Oral Oral Oral  SpO2: 97% 96% 92% 97%  Weight: 114.5 kg     Height: 5\' 8"  (1.727 m)       Wt Readings from Last 3 Encounters:  04/20/19 114.5 kg     Intake/Output Summary (Last 24 hours) at 04/21/2019 1121 Last data filed at 04/21/2019 0100 Gross per 24 hour  Intake 1740 ml  Output 300 ml  Net 1440 ml     Physical Exam Gen Exam:Alert awake-not in any distress HEENT:atraumatic, normocephalic Chest: B/L clear to auscultation anteriorly CVS:S1S2 regular Abdomen:soft non tender, non distended Extremities:no edema Neurology: Non focal Skin: no rash   Data Review:    CBC Recent Labs  Lab 04/19/19 0459 04/19/19 1141 04/20/19 0350 04/21/19 0850  WBC 6.5 4.8 4.9 3.5*  HGB 15.3 13.8 13.9 14.6  HCT 48.6 44.7 43.6 46.0  PLT 107* 92* 99* 99*  MCV 85.6 86.6 83.7 84.2  MCH 26.9 26.7 26.7 26.7  MCHC 31.5 30.9 31.9 31.7  RDW 15.0 15.1 14.8 15.0  LYMPHSABS 2.0  --   --   --   MONOABS 0.7  --   --   --   EOSABS 0.0  --   --   --   BASOSABS 0.0  --   --   --     Chemistries  Recent Labs  Lab 04/19/19 0459 04/19/19 1141 04/20/19 0350 04/21/19 0850  NA 139 138 138 138  K 4.4 3.9 3.7 4.1  CL 100 104 105 101  CO2 25 24 24 27   GLUCOSE 143* 101* 101* 99  BUN 22 23 17 11   CREATININE 2.69* 2.05* 1.58* 1.33*  CALCIUM 8.7* 8.1* 8.6* 8.6*  AST 48*  --   --  50*  ALT 35  --   --  45*  ALKPHOS 62  --   --  56  BILITOT 1.3*  --   --  1.0   ------------------------------------------------------------------------------------------------------------------ No results for input(s): CHOL, HDL,  LDLCALC, TRIG, CHOLHDL, LDLDIRECT in the last 72 hours.  No results found for: HGBA1C ------------------------------------------------------------------------------------------------------------------ No results for input(s): TSH, T4TOTAL, T3FREE, THYROIDAB in the last 72 hours.  Invalid input(s): FREET3 ------------------------------------------------------------------------------------------------------------------ Recent Labs    04/21/19 0850  FERRITIN 416*    Coagulation profile Recent Labs  Lab 04/19/19 1430  INR 1.1    Recent Labs    04/21/19 0850  DDIMER 0.80*    Cardiac Enzymes No results for input(s): CKMB, TROPONINI, MYOGLOBIN in the last 168 hours.  Invalid input(s): CK ------------------------------------------------------------------------------------------------------------------ No results found for: BNP  Micro Results Recent Results (from the past 240 hour(s))  SARS CORONAVIRUS 2 (TAT 6-24 HRS) Nasopharyngeal Nasopharyngeal Swab     Status: Abnormal   Collection Time: 04/19/19  8:29 AM   Specimen: Nasopharyngeal Swab  Result Value Ref Range Status   SARS Coronavirus 2 POSITIVE (A) NEGATIVE Final    Comment: RESULT CALLED TO, READ BACK BY AND VERIFIED WITH: Suezanne Jacquet 1729 04/19/2019 D BRADLEY (NOTE) SARS-CoV-2 target nucleic acids are DETECTED. The SARS-CoV-2 RNA is generally detectable in upper and lower respiratory specimens during the acute phase of infection. Positive results are indicative of active infection with SARS-CoV-2. Clinical  correlation with patient history and other diagnostic information is necessary to determine patient infection status. Positive results do  not rule out bacterial infection or co-infection with other viruses. The expected result is Negative. Fact Sheet for Patients: HairSlick.no Fact Sheet for Healthcare Providers: quierodirigir.com This test is not yet  approved or cleared by the [place] FDA and  has been authorized for detection and/or diagnosis of SARS-CoV-2 by FDA under an Emergency Use Authorization (EUA). This EUA will remain  in effect (meaning this test can be used) for  the duration of the COVID-19 declaration under Section 564(b)(1) of the Act, 21 U.S.C. section 360bbb-3(b)(1), unless the authorization is terminated or revoked sooner. Performed at [facility], [address], [place], [place] [address]     Radiology Reports Dg Chest Port 1 View  Result Date: 04/19/2019 CLINICAL DATA:  Syncope. EXAM: PORTABLE CHEST 1 VIEW COMPARISON:  None. FINDINGS: Left-sided pacemaker in place. Normal heart size for portable AP technique. The cardiomediastinal contours are normal. The lungs are clear. Pulmonary vasculature is normal. No consolidation, pleural effusion, or pneumothorax. No acute osseous abnormalities are seen. IMPRESSION: Left-sided pacemaker in place. No acute abnormality. Electronically Signed   By: Narda Rutherford M.D.   On: 04/19/2019 05:23   Dg Chest Port 1v Today  Result Date: 04/21/2019 CLINICAL DATA:  68 year old male with shortness of breath. EXAM: PORTABLE CHEST 1 VIEW COMPARISON:  Chest radiograph dated 04/19/2019. FINDINGS: Shallow inspiration with minimal left lung base atelectasis with no focal consolidation, pleural effusion, or pneumothorax. Mild cardiomegaly. Left pectoral pacemaker device. No acute osseous pathology. IMPRESSION: No acute cardiopulmonary process. Electronically Signed   By: Elgie Collard M.D.   On: 04/21/2019 09:16

## 2019-04-21 NOTE — Progress Notes (Signed)
PHYSICAL THERAPY EVALUATION  Clinical Impression: Pt admitted with below diagnosis. PTA was living home with spouse and was very independent with all functional mobility and ADLs. Pt currently with functional limitations due to the deficits listed below (see PT Problem List). Pt presents with decreased overall strength, decreased activity tolerance, independence and also balance and coordination. He was able to complete bed mob with mod I, taking increased time to complete, transfers with SBA and ambulation in room to 129ft wih SBA, gait better with increased distance, pt states this is because he ahs not ambulated in 2 weeks. Pt was on 1L/min via [place] and 02 sats remained in high 80s and into 90s.  Pt will benefit from skilled PT to increase his overall strength, balance and coordination, activity tolerance,  independence and safety with mobility to allow discharge to home with no additional PT services.   04/21/19 0900  PT Visit Information  Last PT Received On 04/21/19  History of Present Illness 68 y/o male w/ hx of cardiac defribilator, HTN, cardiac pacemaker arrived to ED after loss of consciousness, pt had dizziness with getting up, pt reports loss of appetite since losing his teeth. Was dx w/ COVID 11/18.  Precautions  Precautions Fall;ICD/Pacemaker  Restrictions  Weight Bearing Restrictions No  Home Living  Family/patient expects to be discharged to: Private residence  Living Arrangements Spouse/significant other  Available Help at Discharge Family  Type of [place] to enter  Entrance Stairs-Number of Steps 14  Entrance Stairs-Rails Right  [place] One level  Bathroom Shower/Tub Tub/shower unit  [place] - single point  Prior Function  Level of Independence Independent  Communication  Communication No difficulties  Pain Assessment  Pain Assessment No/denies pain  Cognition   Arousal/Alertness Awake/alert  Behavior During Therapy WFL for tasks assessed/performed  Overall Cognitive Status Within Functional Limits for tasks assessed  Upper Extremity Assessment  Upper Extremity Assessment Overall WFL for tasks assessed  Lower Extremity Assessment  Lower Extremity Assessment Overall WFL for tasks assessed  Bed Mobility  Overal bed mobility Needs Assistance  Bed Mobility Rolling;Supine to Sit;Sit to Supine  Rolling Supervision  Supine to sit Supervision  Sit to supine Supervision  Transfers  Overall transfer level Needs assistance  Equipment used None  Transfers Sit to/from Stand  Sit to Stand Supervision  Ambulation/Gait  Ambulation/Gait assistance Supervision;Min guard  Gait Distance (Feet) 100 Feet  Assistive device None  Gait Pattern/deviations Wide base of support;Shuffle;Staggering right;Staggering left  General Gait Details initially ambulation with staggering pattern but pt does better with increased time states he has not walked in 2 weeks.  Gait velocity slow  Balance  Overall balance assessment Needs assistance  Sitting-balance support Feet unsupported  Sitting balance-Leahy Scale Good  Standing balance support During functional activity  Standing balance-Leahy Scale Fair  Standing balance comment no LOB but some staggering at start of ambulation  General Comments  General comments (skin integrity, edema, etc.) pt denies dizziness throughout session. BPs taken: sup 93/70 sit 90/74 stand 94/54, pt was on 1L/min via [place] and managed to maintai 02 sats in high 80s and 90s.  Exercises  Exercises Other exercises  Other Exercises  Other Exercises incentive spirometer x 10 reps able to pull 1081ml inconsistently  Other Exercises flutter valve x 10 w/ cues for completion  PT - End of Session  Equipment Utilized During Treatment Oxygen  Activity Tolerance Patient limited by fatigue;Patient  limited by lethargy  Patient left in bed;with call  bell/phone within reach  Nurse Communication Mobility status  PT Assessment  PT Recommendation/Assessment Patient needs continued PT services  PT Visit Diagnosis Unsteadiness on feet (R26.81);Muscle weakness (generalized) (M62.81)  PT Problem List Decreased strength;Decreased activity tolerance;Decreased balance;Decreased mobility;Decreased coordination;Decreased safety awareness  PT Plan  PT Frequency (ACUTE ONLY) Min 3X/week  PT Treatment/Interventions (ACUTE ONLY) Gait training;Stair training;Functional mobility training;Therapeutic activities;Therapeutic exercise;Balance training;Neuromuscular re-education;Patient/family education  AM-PAC PT "6 Clicks" Mobility Outcome Measure (Version 2)  Help needed turning from your back to your side while in a flat bed without using bedrails? 4  Help needed moving from lying on your back to sitting on the side of a flat bed without using bedrails? 4  Help needed moving to and from a bed to a chair (including a wheelchair)? 3  Help needed standing up from a chair using your arms (e.g., wheelchair or bedside chair)? 3  Help needed to walk in hospital room? 3  Help needed climbing 3-5 steps with a railing?  2  6 Click Score 19  Consider Recommendation of Discharge To: Home with [facility]  PT Recommendation  Recommendations for Other Services OT consult  Follow Up Recommendations No PT follow up  PT equipment None recommended by PT  Individuals Consulted  Consulted and Agree with Results and Recommendations Patient  Acute Rehab PT Goals  Patient Stated Goal return to PLOF  PT Goal Formulation With patient  Time For Goal Achievement 05/05/19  Potential to Achieve Goals Good  PT Time Calculation  PT Start Time (ACUTE ONLY) 0910  PT Stop Time (ACUTE ONLY) 0941  PT Time Calculation (min) (ACUTE ONLY) 31 min  PT General Charges  $$ ACUTE PT VISIT 1 Visit  PT Evaluation  $PT Eval Moderate Complexity 1 Mod  PT Treatments  $Therapeutic Activity 8-22 mins   Written Expression  Dominant Hand Right    Drema Pry, PT

## 2019-04-22 LAB — COMPREHENSIVE METABOLIC PANEL
ALT: 51 U/L — ABNORMAL HIGH (ref 0–44)
AST: 57 U/L — ABNORMAL HIGH (ref 15–41)
Albumin: 3.1 g/dL — ABNORMAL LOW (ref 3.5–5.0)
Alkaline Phosphatase: 52 U/L (ref 38–126)
Anion gap: 10 (ref 5–15)
BUN: 12 mg/dL (ref 8–23)
CO2: 26 mmol/L (ref 22–32)
Calcium: 8.7 mg/dL — ABNORMAL LOW (ref 8.9–10.3)
Chloride: 102 mmol/L (ref 98–111)
Creatinine, Ser: 1.28 mg/dL — ABNORMAL HIGH (ref 0.61–1.24)
GFR calc Af Amer: >60 mL/min (ref >60)
GFR calc non Af Amer: 57 mL/min — ABNORMAL LOW (ref >60)
Glucose, Bld: 94 mg/dL (ref 70–99)
Potassium: 3.6 mmol/L (ref 3.5–5.1)
Sodium: 138 mmol/L (ref 135–145)
Total Bilirubin: 0.8 mg/dL (ref 0.3–1.2)
Total Protein: 6.6 g/dL (ref 6.5–8.1)

## 2019-04-22 LAB — CBC
HCT: 44.9 % (ref 39.0–52.0)
Hemoglobin: 14.2 g/dL (ref 13.0–17.0)
MCH: 26.6 pg (ref 26.0–34.0)
MCHC: 31.6 g/dL (ref 30.0–36.0)
MCV: 84.1 fL (ref 80.0–100.0)
Platelets: 97 10*3/uL — ABNORMAL LOW (ref 150–400)
RBC: 5.34 MIL/uL (ref 4.22–5.81)
RDW: 15 % (ref 11.5–15.5)
WBC: 3.3 10*3/uL — ABNORMAL LOW (ref 4.0–10.5)
nRBC: 0 % (ref 0.0–0.2)

## 2019-04-22 LAB — D-DIMER, QUANTITATIVE: D-Dimer, Quant: 0.9 ug/mL-FEU — ABNORMAL HIGH (ref 0.00–0.50)

## 2019-04-22 LAB — C-REACTIVE PROTEIN: CRP: 4.1 mg/dL — ABNORMAL HIGH (ref <1.0)

## 2019-04-22 LAB — FERRITIN: Ferritin: 390 ng/mL — ABNORMAL HIGH (ref 24–336)

## 2019-04-22 MED ORDER — TORSEMIDE 10 MG PO TABS
10.0000 mg | ORAL_TABLET | Freq: Every day | ORAL | Status: DC
  Administered 2019-04-22 – 2019-04-24 (×3): 10 mg via ORAL
  Filled 2019-04-22 (×4): qty 1

## 2019-04-22 MED ORDER — SODIUM CHLORIDE 0.9 % IV SOLN
100.0000 mg | INTRAVENOUS | Status: DC
  Administered 2019-04-23 – 2019-04-25 (×3): 100 mg via INTRAVENOUS
  Filled 2019-04-22 (×3): qty 100

## 2019-04-22 MED ORDER — SODIUM CHLORIDE 0.9 % IV SOLN
Freq: Once | INTRAVENOUS | Status: AC
  Administered 2019-04-22: 21:00:00 via INTRAVENOUS

## 2019-04-22 MED ORDER — DEXAMETHASONE SODIUM PHOSPHATE 10 MG/ML IJ SOLN
6.0000 mg | INTRAMUSCULAR | Status: DC
  Administered 2019-04-22 – 2019-04-23 (×2): 6 mg via INTRAVENOUS
  Filled 2019-04-22 (×2): qty 1

## 2019-04-22 MED ORDER — SIMVASTATIN 10 MG PO TABS
10.0000 mg | ORAL_TABLET | Freq: Every day | ORAL | Status: DC
  Administered 2019-04-22 – 2019-04-25 (×4): 10 mg via ORAL
  Filled 2019-04-22 (×4): qty 1

## 2019-04-22 MED ORDER — CARVEDILOL 3.125 MG PO TABS
6.2500 mg | ORAL_TABLET | Freq: Two times a day (BID) | ORAL | Status: DC
  Administered 2019-04-22: 6.25 mg via ORAL
  Filled 2019-04-22 (×2): qty 2

## 2019-04-22 MED ORDER — SODIUM CHLORIDE 0.9 % IV SOLN
200.0000 mg | Freq: Once | INTRAVENOUS | Status: AC
  Administered 2019-04-22: 200 mg via INTRAVENOUS
  Filled 2019-04-22: qty 40

## 2019-04-22 MED ORDER — ORAL CARE MOUTH RINSE
15.0000 mL | Freq: Two times a day (BID) | OROMUCOSAL | Status: DC
  Administered 2019-04-22 – 2019-04-25 (×8): 15 mL via OROMUCOSAL

## 2019-04-22 MED ORDER — LISINOPRIL 10 MG PO TABS
5.0000 mg | ORAL_TABLET | Freq: Every day | ORAL | Status: DC
  Administered 2019-04-22: 5 mg via ORAL
  Filled 2019-04-22: qty 1

## 2019-04-22 NOTE — Progress Notes (Addendum)
Updated patient's wife Vita Barley on condition and plan of care for patient. All questions answered. Patient has also been speaking to his wife on the phone.   16:07 Called patient's wife to update her on plan for remdesivir treatment and decadron.

## 2019-04-22 NOTE — Progress Notes (Signed)
SATURATION QUALIFICATIONS: (This note is used to comply with regulatory documentation for home oxygen)  Patient Saturations on Room Air at Rest = 92%  Patient Saturations on Room Air while Ambulating = 87%  Patient Saturations on 2 Liters of oxygen while Ambulating = 95%  Please briefly explain why patient needs home oxygen: To maintain O2 sats greater than 90%

## 2019-04-22 NOTE — Progress Notes (Signed)
Spoke with Dr. Shanon Brow about patient's BP-72/55. Patient has a hx of HF and renal insufficiency. Patient took Coreg and Lisinopril today. Order to give NS 23mL bolus now IV and hold morning BP meds.

## 2019-04-22 NOTE — Progress Notes (Addendum)
PROGRESS NOTE                                                                                                                                                                                                             Patient Demographics:    Evan Serrano, is a 68 y.o. male, DOB - 03-02-1951, NID:[id number]  Outpatient Primary MD for the patient is Ferriday date - 04/19/2019   LOS - 2  Chief Complaint  Patient presents with  . Loss of Consciousness       Brief Narrative: Patient is a 68 y.o. male with PMHx of chronic systolic heart failure s/p AICD in place-[facility]presented to the hospital on 11/7 with several days history of poor oral intake, diarrhea-and a syncopal episode in the setting of hypotension due to volume loss.  Found to have COVID-19.  See below for further details.   Subjective:    Evan Serrano today feels better-diarrhea has resolved.  He did not have any lightheadedness when he walks-blood pressure is gradually improving.   Assessment  & Plan :   Syncope: Secondary to orthostatic hypotension in the setting of volume loss due to diarrhea.  Continue telemetry monitoring.  Orthostatic vital signs negative on 11/19.  COVID-19 infection: Inflammatory markers are creeping up-although no pneumonia on repeat x-ray done on 11/19-his O2 saturations are at times in the low 90s/high 80s-suspect that he is at risk for developing severe disease-we will ask RN to ambulate and see how he does.  If he is hypoxic-he will likely need to be started on remdesivir.  Note he has no signs of volume overload  Addendum: O2 saturation drops to 85% when ambulating-begin remdesivir/steroids-CRP worsening as well.  Follow.  Fever: afebrile  O2 requirements:  SpO2: 93 % O2 Flow Rate (L/min): 2 L/min  COVID-19 Labs: Recent Labs    04/21/19 0850 04/22/19 0126  DDIMER 0.80* 0.90*   FERRITIN 416* 390*  CRP 2.5* 4.1*    Lab Results  Component Value Date   SARSCOV2NAA POSITIVE (A) 04/19/2019     COVID-19 Medications: Steroids: see above Remdesivir: see above Actemra: Not indicated Convalescent Plasma: Not indicated  Other medications: Diuretics:Euvolemic-but will go ahead and resume Demadex Antibiotics:Not needed as no evidence of bacterial infection  Prone/Incentive Spirometry: encouraged incentive spirometry use 3-4/hour.  DVT Prophylaxis  :   Heparin   Diarrhea secondary to COVID-19: Resolved with supportive care  AKI: Hemodynamically mediated due to diarrhea and the use of diuretics.  Improving with supportive care  Deconditioning/debility: Secondary to acute illness-PT/OT ordered.  Recheck orthostatic vital signs today.  Chronic systolic heart failure: Euvolemic-but will go out and resume Coreg/lisinopril at reduced doses-resume Demadex as well.  Thrombocytopenia: Secondary to COVID-19 in stable-follow periodically.  Obesity: Estimated body mass index is 38.38 kg/m as calculated from the following:   Height as of this encounter: 5\' 8"  (1.727 m).   Weight as of this encounter: 114.5 kg.   Consults  :  None  Procedures  :  None  ABG:    Component Value Date/Time   HCO3 30.5 (H) 02/15/2007 1543   TCO2 32 02/15/2007 1543    Vent Settings: N/A  Condition - Stable  Family Communication  :  Spouse updated over the phone 11/20  Code Status :  Full Code  Diet :  Diet Order            Diet Heart Room service appropriate? Yes; Fluid consistency: Thin  Diet effective now               Disposition Plan  :  Remain hospitalized  Barriers to discharge: Ongoing diarrhea-soft blood pressure-debility   Antimicorbials  :    Anti-infectives (From admission, onward)   None      Inpatient Medications  Scheduled Meds: . carvedilol  6.25 mg Oral BID WC  . feeding supplement (ENSURE ENLIVE)  237 mL Oral TID BM  . heparin  5,000 Units  Subcutaneous Q8H  . lisinopril  5 mg Oral Daily  . mouth rinse  15 mL Mouth Rinse BID  . simvastatin  10 mg Oral Daily  . torsemide  10 mg Oral Daily   Continuous Infusions: PRN Meds:.acetaminophen **OR** acetaminophen, loperamide, ondansetron **OR** ondansetron (ZOFRAN) IV   Time Spent in minutes  25  See all Orders from today for further details   Jeoffrey Massed M.D on 04/22/2019 at 2:35 PM  To page go to [email or web address] - use universal password  [facility] -  Office  [phone]    Objective:   Vitals:   04/22/19 0331 04/22/19 0336 04/22/19 0744 04/22/19 1200  BP:  114/80 112/77   Pulse: 70 68 69   Resp: (!) 26 18 19    Temp:   98.1 F (36.7 C) 98.9 F (37.2 C)  TempSrc:   Oral Oral  SpO2: 94% 94% 93%   Weight:      Height:        Wt Readings from Last 3 Encounters:  04/20/19 114.5 kg     Intake/Output Summary (Last 24 hours) at 04/22/2019 1435 Last data filed at 04/22/2019 1100 Gross per 24 hour  Intake 240 ml  Output 750 ml  Net -510 ml     Physical Exam Gen Exam:Alert awake-not in any distress HEENT:atraumatic, normocephalic Chest: B/L clear to auscultation anteriorly CVS:S1S2 regular Abdomen:soft non tender, non distended Extremities:no edema Neurology: Non focal Skin: no rash   Data Review:    CBC Recent Labs  Lab 04/19/19 0459 04/19/19 1141 04/20/19 0350 04/21/19 0850 04/22/19 0126  WBC 6.5 4.8 4.9 3.5* 3.3*  HGB 15.3 13.8 13.9 14.6 14.2  HCT 48.6 44.7 43.6 46.0 44.9  PLT 107* 92* 99* 99* 97*  MCV 85.6 86.6 83.7 84.2 84.1  MCH 26.9 26.7 26.7 26.7 26.6  MCHC 31.5 30.9 31.9 31.7  31.6  RDW 15.0 15.1 14.8 15.0 15.0  LYMPHSABS 2.0  --   --   --   --   MONOABS 0.7  --   --   --   --   EOSABS 0.0  --   --   --   --   BASOSABS 0.0  --   --   --   --     Chemistries  Recent Labs  Lab 04/19/19 0459 04/19/19 1141 04/20/19 0350 04/21/19 0850 04/22/19 0126  NA 139 138 138 138 138  K 4.4 3.9 3.7 4.1 3.6  CL 100 104 105  101 102  CO2 25 24 24 27 26   GLUCOSE 143* 101* 101* 99 94  BUN 22 23 17 11 12   CREATININE 2.69* 2.05* 1.58* 1.33* 1.28*  CALCIUM 8.7* 8.1* 8.6* 8.6* 8.7*  AST 48*  --   --  50* 57*  ALT 35  --   --  45* 51*  ALKPHOS 62  --   --  56 52  BILITOT 1.3*  --   --  1.0 0.8   ------------------------------------------------------------------------------------------------------------------ No results for input(s): CHOL, HDL, LDLCALC, TRIG, CHOLHDL, LDLDIRECT in the last 72 hours.  No results found for: HGBA1C ------------------------------------------------------------------------------------------------------------------ No results for input(s): TSH, T4TOTAL, T3FREE, THYROIDAB in the last 72 hours.  Invalid input(s): FREET3 ------------------------------------------------------------------------------------------------------------------ Recent Labs    04/21/19 0850 04/22/19 0126  FERRITIN 416* 390*    Coagulation profile Recent Labs  Lab 04/19/19 1430  INR 1.1    Recent Labs    04/21/19 0850 04/22/19 0126  DDIMER 0.80* 0.90*    Cardiac Enzymes No results for input(s): CKMB, TROPONINI, MYOGLOBIN in the last 168 hours.  Invalid input(s): CK ------------------------------------------------------------------------------------------------------------------ No results found for: BNP  Micro Results Recent Results (from the past 240 hour(s))  SARS CORONAVIRUS 2 (TAT 6-24 HRS) Nasopharyngeal Nasopharyngeal Swab     Status: Abnormal   Collection Time: 04/19/19  8:29 AM   Specimen: Nasopharyngeal Swab  Result Value Ref Range Status   SARS Coronavirus 2 POSITIVE (A) NEGATIVE Final    Comment: RESULT CALLED TO, READ BACK BY AND VERIFIED WITH: Suezanne JacquetM BARBER,RN 1729 04/19/2019 D BRADLEY (NOTE) SARS-CoV-2 target nucleic acids are DETECTED. The SARS-CoV-2 RNA is generally detectable in upper and lower respiratory specimens during the acute phase of infection. Positive results are  indicative of active infection with SARS-CoV-2. Clinical  correlation with patient history and other diagnostic information is necessary to determine patient infection status. Positive results do  not rule out bacterial infection or co-infection with other viruses. The expected result is Negative. Fact Sheet for Patients: HairSlick.nohttps://www.fda.gov/media/138098/download Fact Sheet for Healthcare Providers: quierodirigir.comhttps://www.fda.gov/media/138095/download This test is not yet approved or cleared by the [place] FDA and  has been authorized for detection and/or diagnosis of SARS-CoV-2 by FDA under an Emergency Use Authorization (EUA). This EUA will remain  in effect (meaning this test can be used) for  the duration of the COVID-19 declaration under Section 564(b)(1) of the Act, 21 U.S.C. section 360bbb-3(b)(1), unless the authorization is terminated or revoked sooner. Performed at [facility], [address] [place], [place] [address]     Radiology Reports Dg Chest Port 1 View  Result Date: 04/19/2019 CLINICAL DATA:  Syncope. EXAM: PORTABLE CHEST 1 VIEW COMPARISON:  None. FINDINGS: Left-sided pacemaker in place. Normal heart size for portable AP technique. The cardiomediastinal contours are normal. The lungs are clear. Pulmonary vasculature is normal. No consolidation, pleural effusion, or pneumothorax. No acute  osseous abnormalities are seen. IMPRESSION: Left-sided pacemaker in place. No acute abnormality. Electronically Signed   By: Narda Rutherford M.D.   On: 04/19/2019 05:23   Dg Chest Port 1v Today  Result Date: 04/21/2019 CLINICAL DATA:  68 year old male with shortness of breath. EXAM: PORTABLE CHEST 1 VIEW COMPARISON:  Chest radiograph dated 04/19/2019. FINDINGS: Shallow inspiration with minimal left lung base atelectasis with no focal consolidation, pleural effusion, or pneumothorax. Mild cardiomegaly. Left pectoral pacemaker device. No acute osseous pathology. IMPRESSION: No  acute cardiopulmonary process. Electronically Signed   By: Elgie Collard M.D.   On: 04/21/2019 09:16

## 2019-04-22 NOTE — Progress Notes (Signed)
Occupational Therapy Evaluation Patient Details Name: Evan Serrano MRN: [id number] DOB: 08-Oct-1950 Today's Date: 04/22/2019    History of Present Illness 68 y/o male w/ hx of cardiac defribilator, HTN, cardiac pacemaker arrived to ED after loss of consciousness, pt had dizziness with getting up, pt reports loss of appetite since losing his teeth. Was dx w/ COVID 11/18.   Clinical Impression   Pt requires encourage to stay OOB. Pt complains of his legs feeling "so weak" becuae he has not "done anything for 5 days". Pt ambulated @ 60 ft (RW used due to complaints of leg weakness). Pt overall set up with ADL with the exception of Min A for LB due to fatigue. SO2 above 90 on RA. BP stable in sitting and standing. Educated pt on importance of mobility and use of breathing exercises. Will continue to follow acutely. Do not anticipate OT needs after DC.     Follow Up Recommendations  No OT follow up;Supervision - Intermittent    Equipment Recommendations  Tub/shower seat    Recommendations for Other Services       Precautions / Restrictions Precautions Precautions: Fall;ICD/Pacemaker Restrictions Weight Bearing Restrictions: No      Mobility Bed Mobility Overal bed mobility: Modified Independent                Transfers Overall transfer level: Needs assistance   Transfers: Sit to/from Stand Sit to Stand: Supervision         General transfer comment: multiple attempts to stand. pt states he is "just weak and it is taking time to get up"    Balance Overall balance assessment: Needs assistance   Sitting balance-Leahy Scale: Good       Standing balance-Leahy Scale: Fair                             ADL either performed or assessed with clinical judgement   ADL Overall ADL's : Needs assistance/impaired   Eating/Feeding Details (indicate cue type and reason): poor PO intake     Upper Body Bathing: Set up;Sitting   Lower Body Bathing: Minimal  assistance;Sit to/from stand   Upper Body Dressing : Set up;Sitting   Lower Body Dressing: Minimal assistance;Sit to/from stand   Toilet Transfer: Supervision/safety;RW   Toileting- Water quality scientist and Hygiene: Modified independent       Functional mobility during ADLs: Supervision/safety;Rolling walker General ADL Comments: Pt complaining of BLE feeling so weak     Vision         Perception     Praxis      Pertinent Vitals/Pain Pain Assessment: No/denies pain     Hand Dominance Right   Extremity/Trunk Assessment Upper Extremity Assessment Upper Extremity Assessment: Overall WFL for tasks assessed   Lower Extremity Assessment Lower Extremity Assessment: Generalized weakness(:My legs are so weak")   Cervical / Trunk Assessment Cervical / Trunk Assessment: Normal   Communication Communication Communication: No difficulties   Cognition Arousal/Alertness: Awake/alert Behavior During Therapy: WFL for tasks assessed/performed Overall Cognitive Status: Within Functional Limits for tasks assessed                                     General Comments  pt denies dizziness    Exercises Exercises: Other exercises Other Exercises Other Exercises: incentive spirometer x 10 reps able to pull 1073ml inconsistently Other Exercises: flutter valve x 10 w/  cues for completion   Shoulder Instructions      Home Living Family/patient expects to be discharged to:: Private residence Living Arrangements: Spouse/significant other Available Help at Discharge: Family Type of Home: Apartment Home Access: Stairs to enter Secretary/administrator of Steps: 14 Entrance Stairs-Rails: Right Home Layout: One level     Bathroom Shower/Tub: Chief Strategy Officer: Standard Bathroom Accessibility: Yes How Accessible: Accessible via walker Home Equipment: Cane - single point          Prior Functioning/Environment Level of Independence: Independent                  OT Problem List: Decreased strength;Decreased activity tolerance;Impaired balance (sitting and/or standing);Decreased knowledge of use of DME or AE;Decreased safety awareness;Cardiopulmonary status limiting activity;Obesity      OT Treatment/Interventions: Self-care/ADL training;Therapeutic exercise;Energy conservation;DME and/or AE instruction;Therapeutic activities;Patient/family education    OT Goals(Current goals can be found in the care plan section) Acute Rehab OT Goals Patient Stated Goal: to get better OT Goal Formulation: With patient Time For Goal Achievement: 05/06/19 Potential to Achieve Goals: Good  OT Frequency: Min 3X/week   Barriers to D/C:            Co-evaluation              AM-PAC OT "6 Clicks" Daily Activity     Outcome Measure Help from another person eating meals?: None Help from another person taking care of personal grooming?: None Help from another person toileting, which includes using toliet, bedpan, or urinal?: None Help from another person bathing (including washing, rinsing, drying)?: A Little Help from another person to put on and taking off regular upper body clothing?: None Help from another person to put on and taking off regular lower body clothing?: A Little 6 Click Score: 22   End of Session Equipment Utilized During Treatment: Rolling walker Nurse Communication: Mobility status;Other (comment)(encourage ambulation)  Activity Tolerance: Patient tolerated treatment well Patient left: in bed;with call bell/phone within reach  OT Visit Diagnosis: Unsteadiness on feet (R26.81);Muscle weakness (generalized) (M62.81)                Time: 1219-1300 OT Time Calculation (min): 41 min Charges:  OT General Charges $OT Visit: 1 Visit OT Evaluation $OT Eval Moderate Complexity: 1 Mod OT Treatments $Self Care/Home Management : 8-22 mins $Therapeutic Activity: 8-22 mins  Luisa Dago, OT/L   Acute OT Clinical  Specialist Acute Rehabilitation Services Pager [phone] Office [phone]   [facility] 04/22/2019, 2:34 PM

## 2019-04-23 LAB — COMPREHENSIVE METABOLIC PANEL
ALT: 62 U/L — ABNORMAL HIGH (ref 0–44)
AST: 63 U/L — ABNORMAL HIGH (ref 15–41)
Albumin: 3.4 g/dL — ABNORMAL LOW (ref 3.5–5.0)
Alkaline Phosphatase: 51 U/L (ref 38–126)
Anion gap: 13 (ref 5–15)
BUN: 17 mg/dL (ref 8–23)
CO2: 26 mmol/L (ref 22–32)
Calcium: 8.9 mg/dL (ref 8.9–10.3)
Chloride: 99 mmol/L (ref 98–111)
Creatinine, Ser: 1.67 mg/dL — ABNORMAL HIGH (ref 0.61–1.24)
GFR calc Af Amer: 48 mL/min — ABNORMAL LOW (ref >60)
GFR calc non Af Amer: 41 mL/min — ABNORMAL LOW (ref >60)
Glucose, Bld: 117 mg/dL — ABNORMAL HIGH (ref 70–99)
Potassium: 4.5 mmol/L (ref 3.5–5.1)
Sodium: 138 mmol/L (ref 135–145)
Total Bilirubin: 0.9 mg/dL (ref 0.3–1.2)
Total Protein: 7.4 g/dL (ref 6.5–8.1)

## 2019-04-23 LAB — C-REACTIVE PROTEIN: CRP: 7.4 mg/dL — ABNORMAL HIGH (ref <1.0)

## 2019-04-23 LAB — CBC
HCT: 48.7 % (ref 39.0–52.0)
Hemoglobin: 15.2 g/dL (ref 13.0–17.0)
MCH: 26.2 pg (ref 26.0–34.0)
MCHC: 31.2 g/dL (ref 30.0–36.0)
MCV: 84 fL (ref 80.0–100.0)
Platelets: 107 10*3/uL — ABNORMAL LOW (ref 150–400)
RBC: 5.8 MIL/uL (ref 4.22–5.81)
RDW: 14.9 % (ref 11.5–15.5)
WBC: 4 10*3/uL (ref 4.0–10.5)
nRBC: 0 % (ref 0.0–0.2)

## 2019-04-23 LAB — FERRITIN: Ferritin: 622 ng/mL — ABNORMAL HIGH (ref 24–336)

## 2019-04-23 LAB — D-DIMER, QUANTITATIVE: D-Dimer, Quant: 0.82 ug/mL-FEU — ABNORMAL HIGH (ref 0.00–0.50)

## 2019-04-23 NOTE — Progress Notes (Signed)
Patient's wife Vita Barley called and updated on patient's condition and plan of care. All questions answered.

## 2019-04-23 NOTE — Progress Notes (Signed)
PROGRESS NOTE                                                                                                                                                                                                             Patient Demographics:    Evan Serrano, is a 68 y.o. male, DOB - 08-16-1950, KJI:[id number]  Outpatient [facility] the [facility]   Admit date - 04/19/2019   LOS - 3  Chief Complaint  Patient presents with  . Loss of Consciousness       Brief Narrative: Patient is a 68 y.o. male with PMHx of chronic systolic heart failure s/p AICD in place-[facility]presented to the hospital on 11/7 with several days history of poor oral intake, diarrhea-and a syncopal episode in the setting of hypotension due to volume loss.  Found to have COVID-19.  His initial chest x-ray did not show any pneumonia.  He was admitted and provided supportive care-diarrhea completely resolved.  However he is now starting to develop fever with worsening inflammatory markers-and is presumed to be developing a pneumonia.  Steroid/remdesivir was started on 11/20.  See below for further details  Significant events: 11/17>> admit with syncope and diarrhea 11/19>> diarrhea, hypotension resolved, AKI improved 11/20>> developed exertional hypoxia, and fever 11/20>> remdesivir and steroids started   Subjective:    [name] developed fever overnight-he is requiring 2 L of oxygen to maintain O2 saturation above 90.  Diarrhea has completely resolved.  Looks comfortable.   Assessment  & Plan :   Syncope: Secondary to orthostatic hypotension in the setting of volume loss due to diarrhea.  Continue telemetry monitoring.  Orthostatic vital signs negative on 11/19.  Acute hypoxic respiratory failure secondary to presumed COVID-19 pneumonia: Stable on 2 L-febrile last night-continue steroids and remdesivir.   Repeat Chest x-ray on 11/19 did not show pneumonia-suspect he has pneumonia clinically not seen on imaging studies-his mild disease-do not think repeat imaging is going to change management.  Fever: T-max 101.8 F @ 4 PM yesterday  O2 requirements:  SpO2: 93 % O2 Flow Rate (L/min): 2 L/min  COVID-19 Labs: Recent Labs    04/21/19 0850 04/22/19 0126 04/23/19 0130  DDIMER 0.80* 0.90* 0.82*  FERRITIN 416* 390* 622*  CRP 2.5* 4.1* 7.4*    Lab Results  Component Value Date   SARSCOV2NAA POSITIVE (A) 04/19/2019     COVID-19 Medications: Steroids: 11/20>> Remdesivir: 11/20>>  Other medications: Diuretics:Euvolemic-on usual dosing of Demadex Antibiotics:Not needed as no evidence of bacterial infection  Prone/Incentive Spirometry: encouraged incentive spirometry use 3-4/hour.  DVT Prophylaxis  :   Heparin   Diarrhea secondary to COVID-19: Resolved with supportive care  AKI: Hemodynamically mediated due to diarrhea and the use of diuretics.  Improved with supportive care-diuretics/beta-blocker and lisinopril were restarted on 11/21-however redeveloped transient hypotension last night-creatinine has worsened today.  Stop beta-blocker and lisinopril-reassess tomorrow.  Deconditioning/debility: Secondary to acute illness-PT/OT ordered.  PT/OT following.  Chronic systolic heart failure: Euvolemic-due to worsening creatinine and transient hypotension last night-hold Coreg and lisinopril-we will attempt to continue Demadex today to keep in negative balance-depending on BP readings.  Thrombocytopenia: Secondary to COVID-19 in stable-follow periodically.  Obesity: Estimated body mass index is 38.38 kg/m as calculated from the following:   Height as of this encounter: 5\' 8"  (1.727 m).   Weight as of this encounter: 114.5 kg.   Consults  :  None  Procedures  :  None  ABG:    Component Value Date/Time   HCO3 30.5 (H) 02/15/2007 1543   TCO2 32 02/15/2007 1543    Vent Settings:  N/A  Condition - Stable  Family Communication  :  Spouse updated over the phone 11/21  Code Status :  Full Code  Diet :  Diet Order            Diet Heart Room service appropriate? Yes; Fluid consistency: Thin  Diet effective now               Disposition Plan  :  Remain hospitalized  Barriers to discharge: Hypoxia/needs 5 days of remdesivir  Antimicorbials  :    Anti-infectives (From admission, onward)   Start     Dose/Rate Route Frequency Ordered Stop   04/23/19 1000  remdesivir 100 mg in sodium chloride 0.9 % 250 mL IVPB     100 mg 500 mL/hr over 30 Minutes Intravenous Every 24 hours 04/22/19 1513 04/27/19 0959   04/22/19 1600  remdesivir 200 mg in sodium chloride 0.9 % 250 mL IVPB     200 mg 500 mL/hr over 30 Minutes Intravenous Once 04/22/19 1513 04/23/19 0108      Inpatient Medications  Scheduled Meds: . dexamethasone (DECADRON) injection  6 mg Intravenous Q24H  . feeding supplement (ENSURE ENLIVE)  237 mL Oral TID BM  . heparin  5,000 Units Subcutaneous Q8H  . mouth rinse  15 mL Mouth Rinse BID  . simvastatin  10 mg Oral Daily  . torsemide  10 mg Oral Daily   Continuous Infusions: . remdesivir 100 mg in NS 250 mL 100 mg (04/23/19 1058)   PRN Meds:.acetaminophen **OR** acetaminophen, loperamide, ondansetron **OR** ondansetron (ZOFRAN) IV   Time Spent in minutes  25  See all Orders from today for further details   04/25/19 M.D on 04/23/2019 at 12:09 PM  To page go to [email or web address] - use universal password  [facility] -  Office  [phone]    Objective:   Vitals:   04/22/19 1945 04/23/19 0000 04/23/19 0510 04/23/19 0749  BP: (!) 72/55 95/75 92/71  94/73  Pulse: 84 74 68 74  Resp: (!) 22 (!) 22 13 20   Temp: 98.4 F (36.9 C) 98.6 F (37 C) 98.9 F (37.2 C) 98.5 F (36.9 C)  TempSrc: Oral Oral Oral Oral  SpO2: 95% 91%  93% 93%  Weight:      Height:        Wt Readings from Last 3 Encounters:  04/20/19 114.5 kg      Intake/Output Summary (Last 24 hours) at 04/23/2019 1209 Last data filed at 04/23/2019 0600 Gross per 24 hour  Intake 730 ml  Output 800 ml  Net -70 ml     Physical Exam Gen Exam:Alert awake-not in any distress HEENT:atraumatic, normocephalic Chest: B/L clear to auscultation anteriorly CVS:S1S2 regular Abdomen:soft non tender, non distended Extremities:no edema Neurology: Non focal Skin: no rash   Data Review:    CBC Recent Labs  Lab 04/19/19 0459 04/19/19 1141 04/20/19 0350 04/21/19 0850 04/22/19 0126 04/23/19 0130  WBC 6.5 4.8 4.9 3.5* 3.3* 4.0  HGB 15.3 13.8 13.9 14.6 14.2 15.2  HCT 48.6 44.7 43.6 46.0 44.9 48.7  PLT 107* 92* 99* 99* 97* 107*  MCV 85.6 86.6 83.7 84.2 84.1 84.0  MCH 26.9 26.7 26.7 26.7 26.6 26.2  MCHC 31.5 30.9 31.9 31.7 31.6 31.2  RDW 15.0 15.1 14.8 15.0 15.0 14.9  LYMPHSABS 2.0  --   --   --   --   --   MONOABS 0.7  --   --   --   --   --   EOSABS 0.0  --   --   --   --   --   BASOSABS 0.0  --   --   --   --   --     Chemistries  Recent Labs  Lab 04/19/19 0459 04/19/19 1141 04/20/19 0350 04/21/19 0850 04/22/19 0126 04/23/19 0130  NA 139 138 138 138 138 138  K 4.4 3.9 3.7 4.1 3.6 4.5  CL 100 104 105 101 102 99  CO2 25 24 24 27 26 26   GLUCOSE 143* 101* 101* 99 94 117*  BUN 22 23 17 11 12 17   CREATININE 2.69* 2.05* 1.58* 1.33* 1.28* 1.67*  CALCIUM 8.7* 8.1* 8.6* 8.6* 8.7* 8.9  AST 48*  --   --  50* 57* 63*  ALT 35  --   --  45* 51* 62*  ALKPHOS 62  --   --  56 52 51  BILITOT 1.3*  --   --  1.0 0.8 0.9   ------------------------------------------------------------------------------------------------------------------ No results for input(s): CHOL, HDL, LDLCALC, TRIG, CHOLHDL, LDLDIRECT in the last 72 hours.  No results found for: HGBA1C ------------------------------------------------------------------------------------------------------------------ No results for input(s): TSH, T4TOTAL, T3FREE, THYROIDAB in the last 72 hours.   Invalid input(s): FREET3 ------------------------------------------------------------------------------------------------------------------ Recent Labs    04/22/19 0126 04/23/19 0130  FERRITIN 390* 622*    Coagulation profile Recent Labs  Lab 04/19/19 1430  INR 1.1    Recent Labs    04/22/19 0126 04/23/19 0130  DDIMER 0.90* 0.82*    Cardiac Enzymes No results for input(s): CKMB, TROPONINI, MYOGLOBIN in the last 168 hours.  Invalid input(s): CK ------------------------------------------------------------------------------------------------------------------ No results found for: BNP  Micro Results Recent Results (from the past 240 hour(s))  SARS CORONAVIRUS 2 (TAT 6-24 HRS) Nasopharyngeal Nasopharyngeal Swab     Status: Abnormal   Collection Time: 04/19/19  8:29 AM   Specimen: Nasopharyngeal Swab  Result Value Ref Range Status   SARS Coronavirus 2 POSITIVE (A) NEGATIVE Final    Comment: RESULT CALLED TO, READ BACK BY AND VERIFIED WITH: Adora Fridge 1729 04/19/2019 D BRADLEY (NOTE) SARS-CoV-2 target nucleic acids are DETECTED. The SARS-CoV-2 RNA is generally detectable in upper and lower respiratory specimens during the acute phase  of infection. Positive results are indicative of active infection with SARS-CoV-2. Clinical  correlation with patient history and other diagnostic information is necessary to determine patient infection status. Positive results do  not rule out bacterial infection or co-infection with other viruses. The expected result is Negative. Fact Sheet for Patients: HairSlick.nohttps://www.fda.gov/media/138098/download Fact Sheet for Healthcare Providers: quierodirigir.comhttps://www.fda.gov/media/138095/download This test is not yet approved or cleared by the [place] FDA and  has been authorized for detection and/or diagnosis of SARS-CoV-2 by FDA under an Emergency Use Authorization (EUA). This EUA will remain  in effect (meaning this test can be used) for  the  duration of the COVID-19 declaration under Section 564(b)(1) of the Act, 21 U.S.C. section 360bbb-3(b)(1), unless the authorization is terminated or revoked sooner. Performed at [facility], [address] [place], [place] [address]     Radiology Reports Dg Chest Port 1 View  Result Date: 04/19/2019 CLINICAL DATA:  Syncope. EXAM: PORTABLE CHEST 1 VIEW COMPARISON:  None. FINDINGS: Left-sided pacemaker in place. Normal heart size for portable AP technique. The cardiomediastinal contours are normal. The lungs are clear. Pulmonary vasculature is normal. No consolidation, pleural effusion, or pneumothorax. No acute osseous abnormalities are seen. IMPRESSION: Left-sided pacemaker in place. No acute abnormality. Electronically Signed   By: Narda RutherfordMelanie  Sanford M.D.   On: 04/19/2019 05:23   Dg Chest Port 1v Today  Result Date: 04/21/2019 CLINICAL DATA:  68 year old male with shortness of breath. EXAM: PORTABLE CHEST 1 VIEW COMPARISON:  Chest radiograph dated 04/19/2019. FINDINGS: Shallow inspiration with minimal left lung base atelectasis with no focal consolidation, pleural effusion, or pneumothorax. Mild cardiomegaly. Left pectoral pacemaker device. No acute osseous pathology. IMPRESSION: No acute cardiopulmonary process. Electronically Signed   By: Elgie CollardArash  Radparvar M.D.   On: 04/21/2019 09:16

## 2019-04-23 NOTE — Progress Notes (Signed)
Left AC PIV leaking. Dressing removed. Redness and small amount of purulent drainage noted at insertion site. IV removed. New IV placed in opposite arm. MD paged for notification. Patient has no complains of pain in left arm.

## 2019-04-24 LAB — COMPREHENSIVE METABOLIC PANEL
ALT: 65 U/L — ABNORMAL HIGH (ref 0–44)
AST: 67 U/L — ABNORMAL HIGH (ref 15–41)
Albumin: 3.3 g/dL — ABNORMAL LOW (ref 3.5–5.0)
Alkaline Phosphatase: 47 U/L (ref 38–126)
Anion gap: 10 (ref 5–15)
BUN: 26 mg/dL — ABNORMAL HIGH (ref 8–23)
CO2: 24 mmol/L (ref 22–32)
Calcium: 8.7 mg/dL — ABNORMAL LOW (ref 8.9–10.3)
Chloride: 100 mmol/L (ref 98–111)
Creatinine, Ser: 1.45 mg/dL — ABNORMAL HIGH (ref 0.61–1.24)
GFR calc Af Amer: 57 mL/min — ABNORMAL LOW (ref >60)
GFR calc non Af Amer: 49 mL/min — ABNORMAL LOW (ref >60)
Glucose, Bld: 132 mg/dL — ABNORMAL HIGH (ref 70–99)
Potassium: 4 mmol/L (ref 3.5–5.1)
Sodium: 134 mmol/L — ABNORMAL LOW (ref 135–145)
Total Bilirubin: 0.8 mg/dL (ref 0.3–1.2)
Total Protein: 7 g/dL (ref 6.5–8.1)

## 2019-04-24 LAB — CBC
HCT: 48 % (ref 39.0–52.0)
Hemoglobin: 15.7 g/dL (ref 13.0–17.0)
MCH: 27.1 pg (ref 26.0–34.0)
MCHC: 32.7 g/dL (ref 30.0–36.0)
MCV: 82.8 fL (ref 80.0–100.0)
Platelets: 126 10*3/uL — ABNORMAL LOW (ref 150–400)
RBC: 5.8 MIL/uL (ref 4.22–5.81)
RDW: 15 % (ref 11.5–15.5)
WBC: 5.6 10*3/uL (ref 4.0–10.5)
nRBC: 0 % (ref 0.0–0.2)

## 2019-04-24 LAB — D-DIMER, QUANTITATIVE: D-Dimer, Quant: 0.56 ug/mL-FEU — ABNORMAL HIGH (ref 0.00–0.50)

## 2019-04-24 LAB — FERRITIN: Ferritin: 988 ng/mL — ABNORMAL HIGH (ref 24–336)

## 2019-04-24 LAB — C-REACTIVE PROTEIN: CRP: 4.3 mg/dL — ABNORMAL HIGH (ref <1.0)

## 2019-04-24 MED ORDER — DEXAMETHASONE SODIUM PHOSPHATE 4 MG/ML IJ SOLN
4.0000 mg | INTRAMUSCULAR | Status: DC
  Administered 2019-04-24: 16:00:00 4 mg via INTRAVENOUS
  Filled 2019-04-24: qty 1

## 2019-04-24 MED ORDER — LACTATED RINGERS IV SOLN
INTRAVENOUS | Status: AC
  Administered 2019-04-24: 11:00:00 via INTRAVENOUS

## 2019-04-24 NOTE — Progress Notes (Signed)
PROGRESS NOTE                                                                                                                                                                                                             Patient Demographics:    Evan Serrano, is a 68 y.o. male, DOB - [birth year], EOF:[id number]  [facility]   Admit date - 04/19/2019   LOS - 4  Chief Complaint  Patient presents with  . Loss of Consciousness       Brief Narrative: Patient is a 68 y.o. male with PMHx of chronic systolic heart failure s/p AICD in place-[facility]presented to the hospital on 11/7 with several days history of poor oral intake, diarrhea-and a syncopal episode in the setting of hypotension due to volume loss.  Found to have COVID-19.  His initial chest x-ray did not show any pneumonia.  He was admitted and provided supportive care-diarrhea completely resolved.  However he is now starting to develop fever with worsening inflammatory markers-and is presumed to be developing a pneumonia.  Steroid/remdesivir was started on 11/20.  See below for further details  Significant events: 11/17>> admit with syncope and diarrhea 11/19>> diarrhea, hypotension resolved, AKI improved 11/20>> developed exertional hypoxia, and fever 11/20>> remdesivir and steroids started   Subjective:   Patient in bed, appears comfortable, denies any headache, no fever, no chest pain or pressure, no shortness of breath , no abdominal pain. No focal weakness.   Assessment  & Plan :   Syncope: Secondary to orthostatic hypotension in the setting of volume loss due to diarrhea.  Continue telemetry monitoring.  Orthostatic vital signs negative on 11/19. Taper steroids.  Acute hypoxic respiratory failure secondary to presumed COVID-19 pneumonia: Moderated disease treated with Steroids and Remdesivir, on  1-2 lits [place] 02, symptom free, advance activity and taper off o2.   O2 requirements:  SpO2: 90 % O2 Flow Rate (L/min): 2 L/min  COVID-19 Labs: Recent Labs    04/22/19 0126 04/23/19 0130 04/24/19 0300  DDIMER 0.90* 0.82* 0.56*  FERRITIN 390* 622* 988*  CRP 4.1* 7.4* 4.3*    Lab Results  Component Value Date   SARSCOV2NAA POSITIVE (A) 04/19/2019     COVID-19 Medications: Steroids: 11/20>> Remdesivir: 11/20>>   Diarrhea secondary to COVID-19:  Resolved with supportive care  AKI: Hemodynamically mediated due to diarrhea and the use of diuretics.  Improved with supportive care-diuretics/beta-blocker and lisinopril held  .  Deconditioning/debility: Secondary to acute illness-PT/OT ordered.  PT/OT following.  Chronic systolic heart failure: Euvolemic-due to worsening creatinine and transient hypotension last night-hold Coreg and lisinopril-continue Demadex, compensated. NO Echo in chart.  Thrombocytopenia: Secondary to COVID-19 in stable-follow periodically.  Obesity: BMI 38, follow with PCP.    Condition - Stable  Family Communication  :  Spouse updated over the phone 11/21  Code Status :  Full Code  Diet :  Diet Order            Diet Heart Room service appropriate? Yes; Fluid consistency: Thin  Diet effective now               Disposition Plan  :  Remain hospitalized  Barriers to discharge: Hypoxia/needs 5 days of remdesivir  Antimicorbials  :    Anti-infectives (From admission, onward)   Start     Dose/Rate Route Frequency Ordered Stop   04/23/19 1000  remdesivir 100 mg in sodium chloride 0.9 % 250 mL IVPB     100 mg 500 mL/hr over 30 Minutes Intravenous Every 24 hours 04/22/19 1513 04/27/19 0959   04/22/19 1600  remdesivir 200 mg in sodium chloride 0.9 % 250 mL IVPB     200 mg 500 mL/hr over 30 Minutes Intravenous Once 04/22/19 1513 04/23/19 0108      DVT Prophylaxis  :   Heparin   Inpatient Medications  Scheduled Meds: . dexamethasone  (DECADRON) injection  6 mg Intravenous Q24H  . feeding supplement (ENSURE ENLIVE)  237 mL Oral TID BM  . heparin  5,000 Units Subcutaneous Q8H  . mouth rinse  15 mL Mouth Rinse BID  . simvastatin  10 mg Oral Daily  . torsemide  10 mg Oral Daily   Continuous Infusions: . remdesivir 100 mg in NS 250 mL Stopped (04/23/19 1145)   PRN Meds:.acetaminophen **OR** acetaminophen, loperamide, ondansetron **OR** ondansetron (ZOFRAN) IV   Time Spent in minutes  25  See all Orders from today for further details   Susa RaringPrashant Singh M.D on 04/24/2019 at 9:14 AM  To page go to [email or web address] - use universal password  [facility] -  Office  [phone]    Objective:   Vitals:   04/23/19 1650 04/23/19 2035 04/24/19 0545 04/24/19 0735  BP: (!) 147/90 92/67 102/83 114/77  Pulse: 68 75 71 72  Resp: 20 20 20  (!) 23  Temp: 99 F (37.2 C) 98.6 F (37 C) 98.1 F (36.7 C) 98.4 F (36.9 C)  TempSrc: Oral Oral Oral Oral  SpO2: 92% 92% 94% 90%  Weight:      Height:        Wt Readings from Last 3 Encounters:  04/20/19 114.5 kg     Intake/Output Summary (Last 24 hours) at 04/24/2019 0914 Last data filed at 04/24/2019 0300 Gross per 24 hour  Intake 610 ml  Output 1000 ml  Net -390 ml     Physical Exam  Awake Alert,  No new F.N deficits, Normal affect [place].AT,PERRAL Supple Neck,No JVD, No cervical lymphadenopathy appriciated.  Symmetrical Chest wall movement, Good air movement bilaterally, CTAB RRR,No Gallops, Rubs or new Murmurs, No Parasternal Heave +ve B.Sounds, Abd Soft, No tenderness, No organomegaly appriciated, No rebound - guarding or rigidity. No Cyanosis, Clubbing or edema, No new Rash or bruise    Data Review:    CBC  Recent Labs  Lab 04/19/19 0459  04/20/19 0350 04/21/19 0850 04/22/19 0126 04/23/19 0130 04/24/19 0300  WBC 6.5   < > 4.9 3.5* 3.3* 4.0 5.6  HGB 15.3   < > 13.9 14.6 14.2 15.2 15.7  HCT 48.6   < > 43.6 46.0 44.9 48.7 48.0  PLT 107*   < > 99*  99* 97* 107* 126*  MCV 85.6   < > 83.7 84.2 84.1 84.0 82.8  MCH 26.9   < > 26.7 26.7 26.6 26.2 27.1  MCHC 31.5   < > 31.9 31.7 31.6 31.2 32.7  RDW 15.0   < > 14.8 15.0 15.0 14.9 15.0  LYMPHSABS 2.0  --   --   --   --   --   --   MONOABS 0.7  --   --   --   --   --   --   EOSABS 0.0  --   --   --   --   --   --   BASOSABS 0.0  --   --   --   --   --   --    < > = values in this interval not displayed.    Chemistries  Recent Labs  Lab 04/19/19 0459  04/20/19 0350 04/21/19 0850 04/22/19 0126 04/23/19 0130 04/24/19 0300  NA 139   < > 138 138 138 138 134*  K 4.4   < > 3.7 4.1 3.6 4.5 4.0  CL 100   < > 105 101 102 99 100  CO2 25   < > 24 27 26 26 24   GLUCOSE 143*   < > 101* 99 94 117* 132*  BUN 22   < > 17 11 12 17  26*  CREATININE 2.69*   < > 1.58* 1.33* 1.28* 1.67* 1.45*  CALCIUM 8.7*   < > 8.6* 8.6* 8.7* 8.9 8.7*  AST 48*  --   --  50* 57* 63* 67*  ALT 35  --   --  45* 51* 62* 65*  ALKPHOS 62  --   --  56 52 51 47  BILITOT 1.3*  --   --  1.0 0.8 0.9 0.8   < > = values in this interval not displayed.   ------------------------------------------------------------------------------------------------------------------ No results for input(s): CHOL, HDL, LDLCALC, TRIG, CHOLHDL, LDLDIRECT in the last 72 hours.  No results found for: HGBA1C ------------------------------------------------------------------------------------------------------------------ No results for input(s): TSH, T4TOTAL, T3FREE, THYROIDAB in the last 72 hours.  Invalid input(s): FREET3 ------------------------------------------------------------------------------------------------------------------ Recent Labs    04/23/19 0130 04/24/19 0300  FERRITIN 622* 988*    Coagulation profile Recent Labs  Lab 04/19/19 1430  INR 1.1    Recent Labs    04/23/19 0130 04/24/19 0300  DDIMER 0.82* 0.56*    Cardiac Enzymes No results for input(s): CKMB, TROPONINI, MYOGLOBIN in the last 168 hours.  Invalid  input(s): CK ------------------------------------------------------------------------------------------------------------------ No results found for: BNP  Micro Results Recent Results (from the past 240 hour(s))  SARS CORONAVIRUS 2 (TAT 6-24 HRS) Nasopharyngeal Nasopharyngeal Swab     Status: Abnormal   Collection Time: 04/19/19  8:29 AM   Specimen: Nasopharyngeal Swab  Result Value Ref Range Status   SARS Coronavirus 2 POSITIVE (A) NEGATIVE Final    Comment: RESULT CALLED TO, READ BACK BY AND VERIFIED WITH: 04/26/19 1729 04/19/2019 D BRADLEY (NOTE) SARS-CoV-2 target nucleic acids are DETECTED. The SARS-CoV-2 RNA is generally detectable in upper and lower respiratory specimens during the acute phase  of infection. Positive results are indicative of active infection with SARS-CoV-2. Clinical  correlation with patient history and other diagnostic information is necessary to determine patient infection status. Positive results do  not rule out bacterial infection or co-infection with other viruses. The expected result is Negative. Fact Sheet for Patients: SugarRoll.be Fact Sheet for Healthcare Providers: https://www.woods-mathews.com/ This test is not yet approved or cleared by the [place] FDA and  has been authorized for detection and/or diagnosis of SARS-CoV-2 by FDA under an Emergency Use Authorization (EUA). This EUA will remain  in effect (meaning this test can be used) for  the duration of the COVID-19 declaration under Section 564(b)(1) of the Act, 21 U.S.C. section 360bbb-3(b)(1), unless the authorization is terminated or revoked sooner. Performed at [facility], [place] [address], [place], [place] [address]     Radiology Reports Dg Chest Port 1 View  Result Date: 04/19/2019 CLINICAL DATA:  Syncope. EXAM: PORTABLE CHEST 1 VIEW COMPARISON:  None. FINDINGS: Left-sided pacemaker in place. Normal heart size for portable  AP technique. The cardiomediastinal contours are normal. The lungs are clear. Pulmonary vasculature is normal. No consolidation, pleural effusion, or pneumothorax. No acute osseous abnormalities are seen. IMPRESSION: Left-sided pacemaker in place. No acute abnormality. Electronically Signed   By: Keith Rake M.D.   On: 04/19/2019 05:23   Dg Chest Port 1v Today  Result Date: 04/21/2019 CLINICAL DATA:  68 year old male with shortness of breath. EXAM: PORTABLE CHEST 1 VIEW COMPARISON:  Chest radiograph dated 04/19/2019. FINDINGS: Shallow inspiration with minimal left lung base atelectasis with no focal consolidation, pleural effusion, or pneumothorax. Mild cardiomegaly. Left pectoral pacemaker device. No acute osseous pathology. IMPRESSION: No acute cardiopulmonary process. Electronically Signed   By: Anner Crete M.D.   On: 04/21/2019 09:16

## 2019-04-24 NOTE — Progress Notes (Signed)
Updated patients wife Vita Barley on plan of care and condition. All questions answered.

## 2019-04-25 LAB — COMPREHENSIVE METABOLIC PANEL
ALT: 73 U/L — ABNORMAL HIGH (ref 0–44)
AST: 72 U/L — ABNORMAL HIGH (ref 15–41)
Albumin: 3.1 g/dL — ABNORMAL LOW (ref 3.5–5.0)
Alkaline Phosphatase: 53 U/L (ref 38–126)
Anion gap: 13 (ref 5–15)
BUN: 30 mg/dL — ABNORMAL HIGH (ref 8–23)
CO2: 25 mmol/L (ref 22–32)
Calcium: 9 mg/dL (ref 8.9–10.3)
Chloride: 104 mmol/L (ref 98–111)
Creatinine, Ser: 1.31 mg/dL — ABNORMAL HIGH (ref 0.61–1.24)
GFR calc Af Amer: >60 mL/min (ref >60)
GFR calc non Af Amer: 56 mL/min — ABNORMAL LOW (ref >60)
Glucose, Bld: 147 mg/dL — ABNORMAL HIGH (ref 70–99)
Potassium: 4.1 mmol/L (ref 3.5–5.1)
Sodium: 142 mmol/L (ref 135–145)
Total Bilirubin: 0.8 mg/dL (ref 0.3–1.2)
Total Protein: 6.5 g/dL (ref 6.5–8.1)

## 2019-04-25 LAB — CBC
HCT: 46.2 % (ref 39.0–52.0)
Hemoglobin: 14.7 g/dL (ref 13.0–17.0)
MCH: 26.4 pg (ref 26.0–34.0)
MCHC: 31.8 g/dL (ref 30.0–36.0)
MCV: 83.1 fL (ref 80.0–100.0)
Platelets: 130 10*3/uL — ABNORMAL LOW (ref 150–400)
RBC: 5.56 MIL/uL (ref 4.22–5.81)
RDW: 14.9 % (ref 11.5–15.5)
WBC: 7.3 10*3/uL (ref 4.0–10.5)
nRBC: 0 % (ref 0.0–0.2)

## 2019-04-25 LAB — MAGNESIUM: Magnesium: 1.9 mg/dL (ref 1.7–2.4)

## 2019-04-25 LAB — D-DIMER, QUANTITATIVE: D-Dimer, Quant: 0.38 ug/mL-FEU (ref 0.00–0.50)

## 2019-04-25 LAB — C-REACTIVE PROTEIN: CRP: 1.9 mg/dL — ABNORMAL HIGH (ref <1.0)

## 2019-04-25 MED ORDER — SPIRONOLACTONE 25 MG PO TABS: 12.5000 mg | ORAL_TABLET | Freq: Every day | ORAL | Status: DC

## 2019-04-25 MED ORDER — TORSEMIDE 20 MG PO TABS: 10.0000 mg | ORAL_TABLET | ORAL | Status: DC

## 2019-04-25 MED ORDER — CARVEDILOL 6.25 MG PO TABS: 6.2500 mg | ORAL_TABLET | Freq: Two times a day (BID) | ORAL | 0 refills | Status: DC

## 2019-04-25 MED ORDER — LACTATED RINGERS IV SOLN
INTRAVENOUS | Status: AC
  Administered 2019-04-25: 10:00:00 via INTRAVENOUS

## 2019-04-25 NOTE — TOC Progression Note (Signed)
Transition of Care [facility]) - Progression Note    Patient Details  Name: Evan Serrano MRN: [id number] Date of Birth: [birth year]  Transition of Care [facility]) CM/SW Contact  Joaquin Courts, RN Phone Number: 04/25/2019, 12:42 PM  Clinical Narrative:    Huey Romans arranged to deliver O2 to patient's home. AC to deliver portable concentrator to bedside.   Expected Discharge Plan: [place] Barriers to Discharge: No Barriers Identified  Expected Discharge Plan and Services Expected Discharge Plan: [place]   Discharge Planning Services: CM Consult Post Acute Care Choice: [place] arrangements for the past 2 months: Apartment Expected Discharge Date: 04/25/19               DME Arranged: Oxygen DME Agency: [place] Date DME Agency Contacted: 04/25/19 Time DME Agency Contacted: [id number] Representative spoke with at DME Agency: Learta Codding HH Arranged: RN, PT [facility] Agency: [place] Date [place]: 04/25/19 Time [place]: 86 Representative spoke with at [place]: [place] ([place]) Interventions    Readmission Risk Interventions No flowsheet data found.

## 2019-04-25 NOTE — Progress Notes (Signed)
SATURATION QUALIFICATIONS: (This note is used to comply with regulatory documentation for home oxygen)  Patient Saturations on Room Air at Rest =94%  Patient Saturations on Room Air while Ambulating = 90%  Patient Saturations on0 Liters of oxygen while Ambulating =90%  Please briefly explain why patient needs home oxygen: 

## 2019-04-25 NOTE — Discharge Summary (Signed)
Evan Serrano YTK:[id number] DOB: 1950-06-10 DOA: 04/19/2019  PCP: [facility]  Admit date: 04/19/2019  Discharge date: 04/25/2019  Admitted From:Home   Disposition:  Home   Recommendations for Outpatient Follow-up:   Follow up with PCP in 1-2 weeks  PCP Please obtain BMP/CBC, 2 view CXR in 1week,  (see Discharge instructions)   PCP Please follow up on the following pending results: follow BP, CBC and CMP in 7 to 10 days.  Adjust blood pressure medications as needed.   Home Health: PT,RN   Equipment/Devices: 2lit [place]  Consultations: None  Discharge Condition: Stable    CODE STATUS: Full    Diet Recommendation: Heart Healthy instructed to follow 1.5 L/day total fluid restriction from the 25th of this month.     Chief Complaint  Patient presents with  . Loss of Consciousness     Brief history of present illness from the day of admission and additional interim summary    Patient is a 68 y.o. male with PMHx of chronic systolic heart failure s/p AICD in place-[facility]presented to the hospital on 11/7 with several days history of poor oral intake, diarrhea-and a syncopal episode in the setting of hypotension due to volume loss.  Found to have COVID-19.  His initial chest x-ray did not show any pneumonia.  He was admitted and provided supportive care-diarrhea completely resolved.  However he is now starting to develop fever with worsening inflammatory markers-and is presumed to be developing a pneumonia.  Steroid/remdesivir was started on 11/20.  See below for further details  Significant events: 11/17>> admit with syncope and diarrhea 11/19>> diarrhea, hypotension resolved, AKI improved 11/20>> developed exertional hypoxia, and fever 11/20>> remdesivir and steroids started                                                                 Hospital Course   Syncope: Secondary to orthostatic hypotension in the setting of volume loss due to diarrhea.  Continue telemetry monitoring.  He was not orthostatically hypotensive but his at rest blood pressures are soft hence I have held his diuretic for 3 days giving him IV fluids and have changed his blood pressure medications upon discharge.  Will have home health RN follow him closely and will request him to follow-up with PCP in a week to get his blood pressure medications and diuretic regimen readdressed.  Acute hypoxic respiratory failure secondary to presumed COVID-19 pneumonia: Moderated disease treated with Steroids and Remdesivir, now symptom-free and at rest he is requiring no oxygen however when he ambulates and exerts he does require between 1 to 2 L of oxygen which she will be discharged home on along with a steroid taper, he will need to follow with PCP in a week.Marland Kitchen  [place]  04/23/19 0130 04/24/19 0300 04/25/19 0339  DDIMER 0.82* 0.56* 0.38  FERRITIN 622* 988*  --   CRP 7.4* 4.3* 1.9*    Lab Results  Component Value Date   SARSCOV2NAA POSITIVE (A) 04/19/2019   Hepatic Function Latest Ref Rng & Units 04/25/2019 04/24/2019 04/23/2019  Total Protein 6.5 - 8.1 g/dL 6.5 7.0 7.4  Albumin 3.5 - 5.0 g/dL 3.1(L) 3.3(L) 3.4(L)  AST 15 - 41 U/L 72(H) 67(H) 63(H)  ALT 0 - 44 U/L 73(H) 65(H) 62(H)  Alk Phosphatase 38 - 126 U/L 53 47 51  Total Bilirubin 0.3 - 1.2 mg/dL 0.8 0.8 0.9    Diarrhea secondary to COVID-19: Resolved with supportive care  AKI: Hemodynamically mediated due to diarrhea and the use of diuretics.  Improved with supportive care-diuretics/beta-blocker and lisinopril held  .  Deconditioning/debility: Secondary to acute illness-PT/OT ordered.  Will be discharged home with home PT OT.  Chronic systolic heart failure: Euvolemic-pressure soft changes made as a #1 above. NO Echo  in chart.  Thrombocytopenia: Secondary to COVID-19 in stable- PCP to recheck CBC in 7 to 10 days along with blood pressure and CMP.  Obesity: BMI 38, follow with PCP.      Discharge diagnosis     Principal Problem:   Syncope due to orthostatic hypotension Active Problems:   AKI (acute kidney injury) ([place])   Chronic HFrEF (heart failure with reduced ejection fraction) (H[facility])   Orthostatic hypotension    Discharge instructions    Discharge Instructions    Diet - low sodium heart healthy   Complete by: As directed    Discharge instructions   Complete by: As directed    Follow with Primary [place] in 7 days   Get CBC, CMP, 2 view Chest X ray -  checked next visit within 1 week by Primary MD    Activity: As tolerated with Full fall precautions use walker/cane & assistance as needed  Disposition Home    Diet: Heart Healthy    Special Instructions: If you have smoked or chewed Tobacco  in the last 2 yrs please stop smoking, stop any regular Alcohol  and or any Recreational drug use.  On your next visit with your primary care physician please Get Medicines reviewed and adjusted.  Please request your Prim.MD to go over all Hospital Tests and Procedure/Radiological results at the follow up, please get all Hospital records sent to your Prim MD by signing hospital release before you go home.  If you experience worsening of your admission symptoms, develop shortness of breath, life threatening emergency, suicidal or homicidal thoughts you must seek medical attention immediately by calling 911 or calling your MD immediately  if symptoms less severe.  You Must read complete instructions/literature along with all the possible adverse reactions/side effects for all the Medicines you take and that have been prescribed to you. Take any new Medicines after you have completely understood and accpet all the possible adverse reactions/side effects.   Increase activity  slowly   Complete by: As directed    MyChart COVID-19 home monitoring program   Complete by: Apr 25, 2019    Is the patient willing to use the [employer] for home monitoring?: Yes   Temperature monitoring   Complete by: Apr 25, 2019    After how many days would you like to receive a notification of this patient's flowsheet entries?: 1      Discharge Medications   Allergies as of 04/25/2019  No Known Allergies     Medication List    STOP taking these medications   lisinopril 10 MG tablet Commonly known as: ZESTRIL     TAKE these medications   carvedilol 6.25 MG tablet Commonly known as: COREG Take 1 tablet (6.25 mg total) by mouth 2 (two) times daily with a meal. What changed:   medication strength  how much to take   cholecalciferol 25 MCG (1000 UT) tablet Commonly known as: VITAMIN D Take 1,000 Units by mouth daily.   simvastatin 10 MG tablet Commonly known as: ZOCOR Take 10 mg by mouth daily.   spironolactone 25 MG tablet Commonly known as: ALDACTONE Take 0.5 tablets (12.5 mg total) by mouth daily. Start taking on: April 27, 2019 What changed: These instructions start on April 27, 2019. If you are unsure what to do until then, ask your doctor or other care provider.   torsemide 20 MG tablet Commonly known as: DEMADEX Take 0.5-1 tablets (10-20 mg total) by mouth See admin instructions. Take 60m by mouth daily.  If weight gain of 5lbs or more, then take 212mdaily for two days.  If weight does not decrease, then see MD. Start taking on: April 27, 2019 What changed: These instructions start on April 27, 2019. If you are unsure what to do until then, ask your doctor or other care provider.            Durable Medical Equipment  (From admission, onward)         Start     Ordered   04/25/19 0927  For home use only DME oxygen  Once    Question Answer Comment  Length of Need 6 Months   Mode or (Route) Nasal cannula   Liters per  Minute 2   Frequency Continuous (stationary and portable oxygen unit needed)   Oxygen conserving device Yes   Oxygen delivery system Gas      04/25/19 0926          [facility] as soon as possible for a visit in 1 week(s).   Specialty: General Practice Contact information: [address] [phone]         Major procedures and Radiology Reports - PLEASE review detailed and final reports thoroughly  -        Dg Chest Port 1 View  Result Date: 04/19/2019 CLINICAL DATA:  Syncope. EXAM: PORTABLE CHEST 1 VIEW COMPARISON:  None. FINDINGS: Left-sided pacemaker in place. Normal heart size for portable AP technique. The cardiomediastinal contours are normal. The lungs are clear. Pulmonary vasculature is normal. No consolidation, pleural effusion, or pneumothorax. No acute osseous abnormalities are seen. IMPRESSION: Left-sided pacemaker in place. No acute abnormality. Electronically Signed   By: MeKeith Rake.D.   On: 04/19/2019 05:23   Dg Chest Port 1v Today  Result Date: 04/21/2019 CLINICAL DATA:  [age]ear old male with shortness of breath. EXAM: PORTABLE CHEST 1 VIEW COMPARISON:  Chest radiograph dated 04/19/2019. FINDINGS: Shallow inspiration with minimal left lung base atelectasis with no focal consolidation, pleural effusion, or pneumothorax. Mild cardiomegaly. Left pectoral pacemaker device. No acute osseous pathology. IMPRESSION: No acute cardiopulmonary process. Electronically Signed   By: ArAnner Crete.D.   On: 04/21/2019 09:16    Micro Results     Recent Results (from the past 240 hour(s))  SARS CORONAVIRUS 2 (TAT 6-24 HRS) Nasopharyngeal Nasopharyngeal Swab     Status:  Abnormal   Collection Time: 04/19/19  8:29 AM   Specimen: Nasopharyngeal Swab  Result Value Ref Range Status   SARS Coronavirus 2 POSITIVE (A) NEGATIVE Final    Comment: RESULT CALLED TO, READ BACK BY AND  VERIFIED WITH: Adora Fridge 1729 04/19/2019 D BRADLEY (NOTE) SARS-CoV-2 target nucleic acids are DETECTED. The SARS-CoV-2 RNA is generally detectable in upper and lower respiratory specimens during the acute phase of infection. Positive results are indicative of active infection with SARS-CoV-2. Clinical  correlation with patient history and other diagnostic information is necessary to determine patient infection status. Positive results do  not rule out bacterial infection or co-infection with other viruses. The expected result is Negative. Fact Sheet for Patients: SugarRoll.be Fact Sheet for Healthcare Providers: https://www.woods-mathews.com/ This test is not yet approved or cleared by the [place] FDA and  has been authorized for detection and/or diagnosis of SARS-CoV-2 by FDA under an Emergency Use Authorization (EUA). This EUA will remain  in effect (meaning this test can be used) for  the duration of the COVID-19 declaration under Section 564(b)(1) of the Act, 21 U.S.C. section 360bbb-3(b)(1), unless the authorization is terminated or revoked sooner. Performed at [facility], [place] [address] [place], [place] [address]     Today   Subjective    Evan Serrano today has no headache,no chest abdominal pain,no new weakness tingling or numbness, feels much better wants to go home today.     Objective   Blood pressure 101/82, pulse 73, temperature 98.9 F (37.2 C), temperature source Tympanic, resp. rate 20, height _0  (1.727 m), weight 114.5 kg, SpO2 93 %.   Intake/Output Summary (Last 24 hours) at 04/25/2019 0942 Last data filed at 04/25/2019 0500 Gross per 24 hour  Intake 1824.02 ml  Output 1100 ml  Net 724.02 ml    Exam Awake Alert, Oriented x 3, No new F.N deficits, Normal affect [place].AT,PERRAL Supple Neck,No JVD, No cervical lymphadenopathy appriciated.  Symmetrical Chest wall movement, Good air movement  bilaterally, CTAB RRR,No Gallops,Rubs or new Murmurs, No Parasternal Heave +ve B.Sounds, Abd Soft, Non tender, No organomegaly appriciated, No rebound -guarding or rigidity. No Cyanosis, Clubbing or edema, No new Rash or bruise   Data Review   CBC w Diff:  Lab Results  Component Value Date   WBC 7.3 04/25/2019   HGB 14.7 04/25/2019   HCT 46.2 04/25/2019   PLT 130 (L) 04/25/2019   LYMPHOPCT 31 04/19/2019   MONOPCT 10 04/19/2019   EOSPCT 0 04/19/2019   BASOPCT 0 04/19/2019    CMP:  Lab Results  Component Value Date   NA 142 04/25/2019   K 4.1 04/25/2019   CL 104 04/25/2019   CO2 25 04/25/2019   BUN 30 (H) 04/25/2019   CREATININE 1.31 (H) 04/25/2019   PROT 6.5 04/25/2019   ALBUMIN 3.1 (L) 04/25/2019   BILITOT 0.8 04/25/2019   ALKPHOS 53 04/25/2019   AST 72 (H) 04/25/2019   ALT 73 (H) 04/25/2019  .   Total Time in preparing paper work, data evaluation and todays exam - 102 minutes  Lala Lund M.D on 04/25/2019 at 9:42 AM  [facility]   Office  [phone]

## 2019-04-25 NOTE — Progress Notes (Signed)
    Home health agencies that serve [phone].        Home Health Agencies Search Results  Results List Table  Home Health Agency Information Quality of Patient Care Rating Patient Survey Summary Rating  [facility] [phone] 4  out of 5 stars 5 out of 5 stars  [facility] [phone] 2  out of 5 stars 4 out of 5 stars  [facility] [phone] 4  out of 5 stars 4 out of 5 stars  [facility] [phone] 4 out of 5 stars 4 out of 5 stars  [facility] [phone] 4  out of 5 stars 5 out of 5 stars  [facility] [phone] 4 out of 5 stars 4 out of 5 stars  [facility] [phone] 3  out of 5 stars 4 out of 5 stars  [facility] [phone] 3 out of 5 stars 4 out of 5 stars  [facility] [phone] 4  out of 5 stars 3 out of 5 stars  [facility] [phone] 3  out of 5 stars 4 out of 5 stars  [facility] [phone] 3  out of 5 stars 4 out of 5 stars  [facility] [phone] 4  out of 5 stars 4 out of 5 stars  [facility] [phone] 4  out of 5 stars 3 out of 5 stars   Home Health Footnotes  Footnote number Footnote as displayed on Home Health Compare  1 This agency provides services under a federal waiver program to non-traditional, chronic long term population.  2 This agency provides services to a special needs population.  3 Not Available.  4 The number of patient episodes for this measure is too small to report.  5 This measure currently does not have data or provider has been certified/recertified for less than 6 months.  6 The national average for this measure is not provided because of state-to-state differences in data collection.  7 Medicare is not displaying rates for this measure for any home health agency, because of an issue with  the data.  8 There were problems with the data and they are being corrected.  9 Zero, or very few, patients met the survey's rules for inclusion. The scores shown, if any, reflect a very small number of surveys and may not accurately tell how an agency is doing.  10 Survey results are based on less than 12 months of data.  11 Fewer than 70 patients completed the survey. Use the scores shown, if any, with caution as the number of surveys may be too low to accurately tell how an agency is doing.  12 No survey results are available for this period.  13 Data suppressed by CMS for one or more quarters.    

## 2019-04-25 NOTE — Discharge Instructions (Signed)
Follow with Primary [facility], [facility] in 7 days   Get CBC, CMP, 2 view Chest X ray -  checked next visit within 1 week by Primary MD    Activity: As tolerated with Full fall precautions use walker/cane & assistance as needed  Disposition Home    Diet: Heart Healthy    Special Instructions: If you have smoked or chewed Tobacco  in the last 2 yrs please stop smoking, stop any regular Alcohol  and or any Recreational drug use.  On your next visit with your primary care physician please Get Medicines reviewed and adjusted.  Please request your Prim.MD to go over all Hospital Tests and Procedure/Radiological results at the follow up, please get all Hospital records sent to your Prim MD by signing hospital release before you go home.  If you experience worsening of your admission symptoms, develop shortness of breath, life threatening emergency, suicidal or homicidal thoughts you must seek medical attention immediately by calling 911 or calling your MD immediately  if symptoms less severe.  You Must read complete instructions/literature along with all the possible adverse reactions/side effects for all the Medicines you take and that have been prescribed to you. Take any new Medicines after you have completely understood and accpet all the possible adverse reactions/side effects.       Person Under Monitoring Name: Evan Serrano  Location: [address] [place] [address]   Infection Prevention Recommendations for Individuals Confirmed to have, or Being Evaluated for, 2019 Novel Coronavirus (COVID-19) Infection Who Receive Care at Home  Individuals who are confirmed to have, or are being evaluated for, COVID-19 should follow the prevention steps below until a healthcare provider or local or state health department says they can return to normal activities.  Stay home except to get medical care You should restrict activities outside your home, except for  getting medical care. Do not go to work, school, or public areas, and do not use public transportation or taxis.  Call ahead before visiting your doctor Before your medical appointment, call the healthcare provider and tell them that you have, or are being evaluated for, COVID-19 infection. This will help the healthcare providers office take steps to keep other people from getting infected. Ask your healthcare provider to call the local or state health department.  Monitor your symptoms Seek prompt medical attention if your illness is worsening (e.g., difficulty breathing). Before going to your medical appointment, call the healthcare provider and tell them that you have, or are being evaluated for, COVID-19 infection. Ask your healthcare provider to call the local or state health department.  Wear a facemask You should wear a facemask that covers your nose and mouth when you are in the same room with other people and when you visit a healthcare provider. People who live with or visit you should also wear a facemask while they are in the same room with you.  Separate yourself from other people in your home As much as possible, you should stay in a different room from other people in your home. Also, you should use a separate bathroom, if available.  Avoid sharing household items You should not share dishes, drinking glasses, cups, eating utensils, towels, bedding, or other items with other people in your home. After using these items, you should wash them thoroughly with soap and water.  Cover your coughs and sneezes Cover your mouth and nose with a tissue when you cough or sneeze, or you  can cough or sneeze into your sleeve. Throw used tissues in a lined trash can, and immediately wash your hands with soap and water for at least 20 seconds or use an alcohol-based hand rub.  Wash your [facility] your hands often and thoroughly with soap and water for at least 20 seconds. You can use  an alcohol-based hand sanitizer if soap and water are not available and if your hands are not visibly dirty. Avoid touching your eyes, nose, and mouth with unwashed hands.   Prevention Steps for Caregivers and Household Members of Individuals Confirmed to have, or Being Evaluated for, COVID-19 Infection Being Cared for in the Home  If you live with, or provide care at home for, a person confirmed to have, or being evaluated for, COVID-19 infection please follow these guidelines to prevent infection:  Follow healthcare providers instructions Make sure that you understand and can help the patient follow any healthcare provider instructions for all care.  Provide for the patients basic needs You should help the patient with basic needs in the home and provide support for getting groceries, prescriptions, and other personal needs.  Monitor the patients symptoms If they are getting sicker, call his or her medical provider and tell them that the patient has, or is being evaluated for, COVID-19 infection. This will help the healthcare providers office take steps to keep other people from getting infected. Ask the healthcare provider to call the local or state health department.  Limit the number of people who have contact with the patient  If possible, have only one caregiver for the patient.  Other household members should stay in another home or place of residence. If this is not possible, they should stay  in another room, or be separated from the patient as much as possible. Use a separate bathroom, if available.  Restrict visitors who do not have an essential need to be in the home.  Keep older adults, very young children, and other sick people away from the patient Keep older adults, very young children, and those who have compromised immune systems or chronic health conditions away from the patient. This includes people with chronic heart, lung, or kidney conditions, diabetes,  and cancer.  Ensure good ventilation Make sure that shared spaces in the home have good air flow, such as from an air conditioner or an opened window, weather permitting.  Wash your hands often  Wash your hands often and thoroughly with soap and water for at least 20 seconds. You can use an alcohol based hand sanitizer if soap and water are not available and if your hands are not visibly dirty.  Avoid touching your eyes, nose, and mouth with unwashed hands.  Use disposable paper towels to dry your hands. If not available, use dedicated cloth towels and replace them when they become wet.  Wear a facemask and gloves  Wear a disposable facemask at all times in the room and gloves when you touch or have contact with the patients blood, body fluids, and/or secretions or excretions, such as sweat, saliva, sputum, nasal mucus, vomit, urine, or feces.  Ensure the mask fits over your nose and mouth tightly, and do not touch it during use.  Throw out disposable facemasks and gloves after using them. Do not reuse.  Wash your hands immediately after removing your facemask and gloves.  If your personal clothing becomes contaminated, carefully remove clothing and launder. Wash your hands after handling contaminated clothing.  Place all used disposable facemasks, gloves,  and other waste in a lined container before disposing them with other household waste.  Remove gloves and wash your hands immediately after handling these items.  Do not share dishes, glasses, or other household items with the patient  Avoid sharing household items. You should not share dishes, drinking glasses, cups, eating utensils, towels, bedding, or other items with a patient who is confirmed to have, or being evaluated for, COVID-19 infection.  After the person uses these items, you should wash them thoroughly with soap and water.  Wash laundry thoroughly  Immediately remove and wash clothes or bedding that have blood,  body fluids, and/or secretions or excretions, such as sweat, saliva, sputum, nasal mucus, vomit, urine, or feces, on them.  Wear gloves when handling laundry from the patient.  Read and follow directions on labels of laundry or clothing items and detergent. In general, wash and dry with the warmest temperatures recommended on the label.  Clean all areas the individual has used often  Clean all touchable surfaces, such as counters, tabletops, doorknobs, bathroom fixtures, toilets, phones, keyboards, tablets, and bedside tables, every day. Also, clean any surfaces that may have blood, body fluids, and/or secretions or excretions on them.  Wear gloves when cleaning surfaces the patient has come in contact with.  Use a diluted bleach solution (e.g., dilute bleach with 1 part bleach and 10 parts water) or a household disinfectant with a label that says EPA-registered for coronaviruses. To make a bleach solution at home, add 1 tablespoon of bleach to 1 quart (4 cups) of water. For a larger supply, add  cup of bleach to 1 gallon (16 cups) of water.  Read labels of cleaning products and follow recommendations provided on product labels. Labels contain instructions for safe and effective use of the cleaning product including precautions you should take when applying the product, such as wearing gloves or eye protection and making sure you have good ventilation during use of the product.  Remove gloves and wash hands immediately after cleaning.  Monitor yourself for signs and symptoms of illness Caregivers and household members are considered close contacts, should monitor their health, and will be asked to limit movement outside of the home to the extent possible. Follow the monitoring steps for close contacts listed on the symptom monitoring form.   ? If you have additional questions, contact your local health department or call the epidemiologist on call at [phone] (available 24/7). ? This  guidance is subject to change. For the most up-to-date guidance from [facility], please refer to their website: [email or web address]

## 2019-04-25 NOTE — TOC Progression Note (Signed)
Transition of Care [facility]) - Progression Note    Patient Details  Name: Evan Serrano MRN: [id number] Date of Birth: 1950/06/09  Transition of [facility]) CM/SW Contact  Joaquin Courts, RN Phone Number: 04/25/2019, 12:33 PM  Clinical Narrative:    CM spoke with patient's wife, patient set up with Alvis Lemmings for [place] and RN.    Expected Discharge Plan: [place] Barriers to Discharge: No Barriers Identified  Expected Discharge Plan and Services Expected Discharge Plan: [place]   Discharge Planning Services: CM Consult Post Acute Care Choice: [place] arrangements for the past 2 months: Apartment Expected Discharge Date: 04/25/19               DME Arranged: N/A DME Agency: NA       HH Arranged: RN, PT HH Agency: [place] Date [facility] Agency Contacted: 04/25/19 Time HH Agency Contacted: 57 Representative spoke with at [place]: [place] ([place]) Interventions    Readmission Risk Interventions No flowsheet data found.

## 2020-05-24 ENCOUNTER — Other Ambulatory Visit: Payer: Self-pay

## 2020-05-24 ENCOUNTER — Emergency Department (HOSPITAL_COMMUNITY)
Admission: EM | Admit: 2020-05-24 | Discharge: 2020-05-25 | Disposition: A | Discharge status: Home / Self Care | Payer: No Typology Code available for payment source | Attending: Emergency Medicine | Admitting: Emergency Medicine

## 2020-05-24 ENCOUNTER — Emergency Department (HOSPITAL_COMMUNITY): Payer: No Typology Code available for payment source

## 2020-05-24 DIAGNOSIS — Z7982 Long term (current) use of aspirin: Secondary | ICD-10-CM | POA: Insufficient documentation

## 2020-05-24 DIAGNOSIS — Z20822 Contact with and (suspected) exposure to covid-19: Secondary | ICD-10-CM | POA: Diagnosis not present

## 2020-05-24 DIAGNOSIS — I509 Heart failure, unspecified: Secondary | ICD-10-CM | POA: Diagnosis not present

## 2020-05-24 DIAGNOSIS — I110 Hypertensive heart disease with heart failure: Secondary | ICD-10-CM | POA: Insufficient documentation

## 2020-05-24 DIAGNOSIS — R0602 Shortness of breath: Secondary | ICD-10-CM | POA: Diagnosis not present

## 2020-05-24 DIAGNOSIS — Z8616 Personal history of COVID-19: Secondary | ICD-10-CM | POA: Insufficient documentation

## 2020-05-24 LAB — BASIC METABOLIC PANEL
Anion gap: 12 (ref 5–15)
BUN: 16 mg/dL (ref 8–23)
CO2: 24 mmol/L (ref 22–32)
Calcium: 9.4 mg/dL (ref 8.9–10.3)
Chloride: 102 mmol/L (ref 98–111)
Creatinine, Ser: 1.47 mg/dL — ABNORMAL HIGH (ref 0.61–1.24)
GFR, Estimated: 51 mL/min — ABNORMAL LOW (ref >60)
Glucose, Bld: 138 mg/dL — ABNORMAL HIGH (ref 70–99)
Potassium: 4.3 mmol/L (ref 3.5–5.1)
Sodium: 138 mmol/L (ref 135–145)

## 2020-05-24 LAB — CBC
HCT: 44.8 % (ref 39.0–52.0)
Hemoglobin: 14.6 g/dL (ref 13.0–17.0)
MCH: 27 pg (ref 26.0–34.0)
MCHC: 32.6 g/dL (ref 30.0–36.0)
MCV: 82.8 fL (ref 80.0–100.0)
Platelets: 159 10*3/uL (ref 150–400)
RBC: 5.41 MIL/uL (ref 4.22–5.81)
RDW: 16 % — ABNORMAL HIGH (ref 11.5–15.5)
WBC: 7.8 10*3/uL (ref 4.0–10.5)
nRBC: 0.3 % — ABNORMAL HIGH (ref 0.0–0.2)

## 2020-05-24 LAB — RESP PANEL BY RT-PCR (FLU A&B, COVID) ARPGX2
Influenza A by PCR: NEGATIVE
Influenza B by PCR: NEGATIVE
SARS Coronavirus 2 by RT PCR: NEGATIVE

## 2020-05-24 MED ORDER — ALBUTEROL SULFATE HFA 108 (90 BASE) MCG/ACT IN AERS
2.0000 | INHALATION_SPRAY | RESPIRATORY_TRACT | Status: DC | PRN
  Administered 2020-05-25: 2 via RESPIRATORY_TRACT
  Filled 2020-05-24: qty 6.7

## 2020-05-24 NOTE — ED Triage Notes (Signed)
Pt presents to ED POV. Pt c/o SOB and cough since 12/18. resp tachypniec and labored in triage. Pt is vaccinated for covid

## 2020-05-25 LAB — BRAIN NATRIURETIC PEPTIDE: B Natriuretic Peptide: 834.5 pg/mL — ABNORMAL HIGH (ref 0.0–100.0)

## 2020-05-25 MED ORDER — FUROSEMIDE 10 MG/ML IJ SOLN
80.0000 mg | Freq: Once | INTRAMUSCULAR | Status: AC
  Administered 2020-05-25: 02:00:00 80 mg via INTRAVENOUS
  Filled 2020-05-25: qty 8

## 2020-05-25 NOTE — ED Notes (Signed)
92% on room air while ambulating, Dr. Bebe Shaggy made aware.

## 2020-05-25 NOTE — ED Provider Notes (Signed)
[facility] EMERGENCY DEPARTMENT Provider Note   CSN: [id number] Arrival date & time: 05/24/20  2004     History Chief Complaint  Patient presents with  . Shortness of Breath    Evan Serrano is a 69 y.o. male.  The history is provided by the patient.  Shortness of Breath Severity:  Moderate Onset quality:  Gradual Duration:  6 days Timing:  Intermittent Progression:  Worsening Chronicity:  New Relieved by:  Rest Worsened by:  Activity Associated symptoms: cough   Associated symptoms: no abdominal pain, no chest pain, no fever, no hemoptysis and no vomiting   Patient w/history of CAD/CHF/hypertension, with ICD in place presents with shortness of breath.  He reports over the past 6 days he has had episodes of orthopnea and dyspnea on exertion.  No chest pain.  No fevers or vomiting.  He reports mild cough.  He is a non-smoker.  He reports minimal amount of weight gain.  No excessive lower extremity edema.   Patient is fully vaccinated for COVID-19  He denies any ICD shocks  Past Medical History:  Diagnosis Date  . Cardiac pacemaker   . Hypertension   . Presence of cardiac defibrillator     Patient Active Problem List   Diagnosis Date Noted  . Orthostatic hypotension 04/20/2019  . COVID-19 virus infection   . Renal insufficiency   . AKI (acute kidney injury) (HCC) 04/19/2019  . Chronic HFrEF (heart failure with reduced ejection fraction) (HCC) 04/19/2019  . Syncope due to orthostatic hypotension 04/19/2019    No past surgical history on file.     No family history on file.  Social History   Tobacco Use  . Smoking status: Never Smoker  . Smokeless tobacco: Never Used  Substance Use Topics  . Alcohol use: Never  . Drug use: Never    Home Medications Prior to Admission medications   Medication Sig Start Date End Date Taking? Authorizing Provider  aspirin 81 MG EC tablet Take 81 mg by mouth daily.   Yes [provider]   carvedilol (COREG) 6.25 MG tablet Take 1 tablet (6.25 mg total) by mouth 2 (two) times daily with a meal. 04/25/19  Yes Leroy Sea, MD  cholecalciferol (VITAMIN D) 25 MCG (1000 UT) tablet Take 1,000 Units by mouth daily.   Yes [provider]  simvastatin (ZOCOR) 10 MG tablet Take 10 mg by mouth at bedtime.   Yes [provider]  spironolactone (ALDACTONE) 25 MG tablet Take 0.5 tablets (12.5 mg total) by mouth daily. 04/27/19  Yes Leroy Sea, MD  torsemide (DEMADEX) 20 MG tablet Take 0.5-1 tablets (10-20 mg total) by mouth See admin instructions. Take 10mg  by mouth daily.  If weight gain of 5lbs or more, then take 20mg  daily for two days.  If weight does not decrease, then see MD. 04/27/19  Yes , MD    Allergies    Patient has no known allergies.  Review of Systems   Review of Systems  Constitutional: Negative for fever and unexpected weight change.  Respiratory: Positive for cough and shortness of breath. Negative for hemoptysis.   Cardiovascular: Negative for chest pain and leg swelling.  Gastrointestinal: Negative for abdominal pain and vomiting.  All other systems reviewed and are negative.   Physical Exam Updated Vital Signs BP (!) 149/98 (BP Location: Left Arm)   Pulse 83   Temp 98.3 F (36.8 C) (Oral)   Resp (!) 22   Ht 1.753  m (5\' 9" )   Wt 115.7 kg   SpO2 92%   BMI 37.66 kg/m   Physical Exam CONSTITUTIONAL: Well developed/well nourished HEAD: Normocephalic/atraumatic EYES: EOMI/PERRL ENMT: Mucous membranes moist, poor dentition NECK: supple no meningeal signs, no JVD SPINE/BACK:entire spine nontender CV: S1/S2 noted, no murmurs/rubs/gallops noted LUNGS: Scattered wheeze bilaterally, no acute distress ABDOMEN: soft, nontender, no rebound or guarding, bowel sounds noted throughout abdomen GU:no cva tenderness NEURO: Pt is awake/alert/appropriate, moves all extremitiesx4.  No facial droop.   EXTREMITIES: pulses  normal/equal, full ROM, no calf tenderness or edema SKIN: warm, color normal PSYCH: no abnormalities of mood noted, alert and oriented to situation  ED Results / Procedures / Treatments   Labs (all labs ordered are listed, but only abnormal results are displayed) Labs Reviewed  BASIC METABOLIC PANEL - Abnormal; Notable for the following components:      Result Value   Glucose, Bld 138 (*)    Creatinine, Ser 1.47 (*)    GFR, Estimated 51 (*)    All other components within normal limits  CBC - Abnormal; Notable for the following components:   RDW 16.0 (*)    nRBC 0.3 (*)    All other components within normal limits  BRAIN NATRIURETIC PEPTIDE - Abnormal; Notable for the following components:   B Natriuretic Peptide 834.5 (*)    All other components within normal limits  RESP PANEL BY RT-PCR (FLU A&B, COVID) [id number]    EKG EKG Interpretation  Date/Time:  Thursday May 24 2020 20:11:59 EST Ventricular Rate:  81 PR Interval:  192 QRS Duration: 110 QT Interval:  406 QTC Calculation: 471 R Axis:   -15 Text Interpretation: Normal sinus rhythm Inferior infarct , age undetermined Anterior infarct , age undetermined Abnormal ECG No significant change since last tracing Confirmed by [id number] (Zadie Rhine) on 05/25/2020 1:58:16 AM   Radiology DG Chest Portable 1 View  Result Date: 05/24/2020 CLINICAL DATA:  Shortness of breath EXAM: PORTABLE CHEST 1 VIEW COMPARISON:  04/21/2019 FINDINGS: Pacer/AICD device tip at right ventricle. Midline trachea. Moderate cardiomegaly. No right and no definite left pleural effusion. No pneumothorax. Mild pulmonary interstitial thickening. IMPRESSION: Cardiomegaly with mild pulmonary interstitial thickening. Suspicious for mild pulmonary venous congestion. No overt congestive failure. Electronically Signed   By: 04/23/2019 M.D.   On: 05/24/2020 20:37    Procedures Procedures    Medications Ordered in ED Medications  albuterol (VENTOLIN HFA)  108 (90 Base) MCG/ACT inhaler 2 puff (2 puffs Inhalation Given 05/25/20 0223)  furosemide (LASIX) injection 80 mg (80 mg Intravenous Given 05/25/20 0223)    ED Course  I have reviewed the triage vital signs and the nursing notes.  Pertinent labs & imaging results that were available during my care of the patient were reviewed by me and considered in my medical decision making (see chart for details).    MDM Rules/Calculators/A&P                          Patient reports over the past 6 days has had increasing orthopnea dyspnea exertion.  COVID-19 testing is negative Mild congestion on chest x-ray suggestive of early CHF.  Patient was given Lasix with significant diuresis with over 2 L.  Patient now feels improved.  He ambulated and felt well.  Pulse ox 92% on ambulation.  At rest pulse ox is above 95%. Patient requesting discharge home.  [facility] Final Clinical Impression(s) / ED  Diagnoses Final diagnoses:  Acute congestive heart failure, unspecified heart failure type [facility])    Rx / DC Orders ED Discharge Orders    None       Zadie Rhine, MD 05/25/20 [phone]

## 2021-01-12 ENCOUNTER — Encounter (HOSPITAL_BASED_OUTPATIENT_CLINIC_OR_DEPARTMENT_OTHER): Payer: Self-pay | Admitting: Emergency Medicine

## 2021-01-12 ENCOUNTER — Emergency Department (HOSPITAL_BASED_OUTPATIENT_CLINIC_OR_DEPARTMENT_OTHER): Payer: No Typology Code available for payment source

## 2021-01-12 ENCOUNTER — Other Ambulatory Visit: Payer: Self-pay

## 2021-01-12 ENCOUNTER — Inpatient Hospital Stay (HOSPITAL_BASED_OUTPATIENT_CLINIC_OR_DEPARTMENT_OTHER)
Admission: EM | Admit: 2021-01-12 | Discharge: 2021-01-14 | DRG: 291 | Disposition: A | Discharge status: Home / Self Care | Payer: No Typology Code available for payment source | Attending: Internal Medicine | Admitting: Internal Medicine

## 2021-01-12 DIAGNOSIS — I130 Hypertensive heart and chronic kidney disease with heart failure and stage 1 through stage 4 chronic kidney disease, or unspecified chronic kidney disease: Principal | ICD-10-CM | POA: Diagnosis present

## 2021-01-12 DIAGNOSIS — N1831 Chronic kidney disease, stage 3a: Secondary | ICD-10-CM

## 2021-01-12 DIAGNOSIS — N1832 Chronic kidney disease, stage 3b: Secondary | ICD-10-CM

## 2021-01-12 DIAGNOSIS — R053 Chronic cough: Secondary | ICD-10-CM | POA: Diagnosis present

## 2021-01-12 DIAGNOSIS — R42 Dizziness and giddiness: Secondary | ICD-10-CM

## 2021-01-12 DIAGNOSIS — I5023 Acute on chronic systolic (congestive) heart failure: Secondary | ICD-10-CM | POA: Diagnosis present

## 2021-01-12 DIAGNOSIS — Z7982 Long term (current) use of aspirin: Secondary | ICD-10-CM | POA: Diagnosis not present

## 2021-01-12 DIAGNOSIS — Z20822 Contact with and (suspected) exposure to covid-19: Secondary | ICD-10-CM | POA: Diagnosis present

## 2021-01-12 DIAGNOSIS — I509 Heart failure, unspecified: Secondary | ICD-10-CM | POA: Diagnosis present

## 2021-01-12 DIAGNOSIS — Z8616 Personal history of COVID-19: Secondary | ICD-10-CM

## 2021-01-12 DIAGNOSIS — I5021 Acute systolic (congestive) heart failure: Secondary | ICD-10-CM

## 2021-01-12 DIAGNOSIS — R531 Weakness: Secondary | ICD-10-CM

## 2021-01-12 DIAGNOSIS — I10 Essential (primary) hypertension: Secondary | ICD-10-CM | POA: Diagnosis not present

## 2021-01-12 DIAGNOSIS — Z79899 Other long term (current) drug therapy: Secondary | ICD-10-CM | POA: Diagnosis not present

## 2021-01-12 DIAGNOSIS — I447 Left bundle-branch block, unspecified: Secondary | ICD-10-CM | POA: Diagnosis present

## 2021-01-12 DIAGNOSIS — N289 Disorder of kidney and ureter, unspecified: Secondary | ICD-10-CM

## 2021-01-12 DIAGNOSIS — Z95810 Presence of automatic (implantable) cardiac defibrillator: Secondary | ICD-10-CM | POA: Diagnosis not present

## 2021-01-12 DIAGNOSIS — E440 Moderate protein-calorie malnutrition: Secondary | ICD-10-CM | POA: Insufficient documentation

## 2021-01-12 DIAGNOSIS — N189 Chronic kidney disease, unspecified: Secondary | ICD-10-CM | POA: Diagnosis not present

## 2021-01-12 LAB — CBC
HCT: 49.3 % (ref 39.0–52.0)
HCT: 47.8 % (ref 39.0–52.0)
Hemoglobin: 15.7 g/dL (ref 13.0–17.0)
Hemoglobin: 15.4 g/dL (ref 13.0–17.0)
MCH: 26.6 pg (ref 26.0–34.0)
MCH: 26.9 pg (ref 26.0–34.0)
MCHC: 31.8 g/dL (ref 30.0–36.0)
MCHC: 32.2 g/dL (ref 30.0–36.0)
MCV: 83.6 fL (ref 80.0–100.0)
MCV: 83.4 fL (ref 80.0–100.0)
Platelets: 167 10*3/uL (ref 150–400)
Platelets: 153 10*3/uL (ref 150–400)
RBC: 5.9 MIL/uL — ABNORMAL HIGH (ref 4.22–5.81)
RBC: 5.73 MIL/uL (ref 4.22–5.81)
RDW: 17.7 % — ABNORMAL HIGH (ref 11.5–15.5)
RDW: 16.5 % — ABNORMAL HIGH (ref 11.5–15.5)
WBC: 7.2 10*3/uL (ref 4.0–10.5)
WBC: 7.2 10*3/uL (ref 4.0–10.5)
nRBC: 0 % (ref 0.0–0.2)
nRBC: 0 % (ref 0.0–0.2)

## 2021-01-12 LAB — HIV ANTIBODY (ROUTINE TESTING W REFLEX): HIV Screen 4th Generation wRfx: NONREACTIVE

## 2021-01-12 LAB — MAGNESIUM: Magnesium: 2.1 mg/dL (ref 1.7–2.4)

## 2021-01-12 LAB — BASIC METABOLIC PANEL
Anion gap: 10 (ref 5–15)
BUN: 20 mg/dL (ref 8–23)
CO2: 27 mmol/L (ref 22–32)
Calcium: 9.6 mg/dL (ref 8.9–10.3)
Chloride: 102 mmol/L (ref 98–111)
Creatinine, Ser: 1.62 mg/dL — ABNORMAL HIGH (ref 0.61–1.24)
GFR, Estimated: 45 mL/min — ABNORMAL LOW (ref >60)
Glucose, Bld: 114 mg/dL — ABNORMAL HIGH (ref 70–99)
Potassium: 4 mmol/L (ref 3.5–5.1)
Sodium: 139 mmol/L (ref 135–145)

## 2021-01-12 LAB — RESP PANEL BY RT-PCR (FLU A&B, COVID) ARPGX2
Influenza A by PCR: NEGATIVE
Influenza B by PCR: NEGATIVE
SARS Coronavirus 2 by RT PCR: NEGATIVE

## 2021-01-12 LAB — CREATININE, SERUM
Creatinine, Ser: 1.57 mg/dL — ABNORMAL HIGH (ref 0.61–1.24)
GFR, Estimated: 47 mL/min — ABNORMAL LOW (ref >60)

## 2021-01-12 LAB — PROTIME-INR
INR: 1.2 (ref 0.8–1.2)
Prothrombin Time: 15.3 seconds — ABNORMAL HIGH (ref 11.4–15.2)

## 2021-01-12 LAB — BRAIN NATRIURETIC PEPTIDE: B Natriuretic Peptide: 1707.8 pg/mL — ABNORMAL HIGH (ref 0.0–100.0)

## 2021-01-12 MED ORDER — CARVEDILOL 6.25 MG PO TABS: 6.2500 mg | ORAL_TABLET | Freq: Two times a day (BID) | ORAL | Status: DC

## 2021-01-12 MED ORDER — IPRATROPIUM-ALBUTEROL 0.5-2.5 (3) MG/3ML IN SOLN
3.0000 mL | RESPIRATORY_TRACT | Status: DC | PRN
  Administered 2021-01-12: 3 mL via RESPIRATORY_TRACT
  Filled 2021-01-12: qty 3

## 2021-01-12 MED ORDER — FUROSEMIDE 10 MG/ML IJ SOLN
40.0000 mg | Freq: Two times a day (BID) | INTRAMUSCULAR | Status: DC
  Administered 2021-01-12 – 2021-01-13 (×3): 40 mg via INTRAVENOUS
  Filled 2021-01-12 (×3): qty 4

## 2021-01-12 MED ORDER — VITAMIN D3 25 MCG (1000 UNIT) PO TABS
1000.0000 [IU] | ORAL_TABLET | Freq: Every day | ORAL | Status: DC
  Administered 2021-01-13 – 2021-01-14 (×2): 1000 [IU] via ORAL
  Filled 2021-01-12 (×4): qty 1

## 2021-01-12 MED ORDER — SODIUM CHLORIDE 0.9% FLUSH
3.0000 mL | Freq: Two times a day (BID) | INTRAVENOUS | Status: DC
  Administered 2021-01-12 – 2021-01-14 (×5): 3 mL via INTRAVENOUS

## 2021-01-12 MED ORDER — ONDANSETRON HCL 4 MG/2ML IJ SOLN: 4.0000 mg | Freq: Four times a day (QID) | INTRAMUSCULAR | Status: DC | PRN

## 2021-01-12 MED ORDER — FUROSEMIDE 10 MG/ML IJ SOLN
40.0000 mg | Freq: Once | INTRAMUSCULAR | Status: AC
  Administered 2021-01-12: 40 mg via INTRAVENOUS
  Filled 2021-01-12: qty 4

## 2021-01-12 MED ORDER — CARVEDILOL 3.125 MG PO TABS
3.1250 mg | ORAL_TABLET | Freq: Two times a day (BID) | ORAL | Status: DC
  Administered 2021-01-13 – 2021-01-14 (×3): 3.125 mg via ORAL
  Filled 2021-01-12 (×4): qty 1

## 2021-01-12 MED ORDER — SODIUM CHLORIDE 0.9% FLUSH: 3.0000 mL | INTRAVENOUS | Status: DC | PRN

## 2021-01-12 MED ORDER — ENOXAPARIN SODIUM 40 MG/0.4ML IJ SOSY
40.0000 mg | PREFILLED_SYRINGE | INTRAMUSCULAR | Status: DC
  Administered 2021-01-12 – 2021-01-13 (×2): 40 mg via SUBCUTANEOUS
  Filled 2021-01-12 (×2): qty 0.4

## 2021-01-12 MED ORDER — SIMVASTATIN 20 MG PO TABS
10.0000 mg | ORAL_TABLET | Freq: Every day | ORAL | Status: DC
  Administered 2021-01-12 – 2021-01-13 (×2): 10 mg via ORAL
  Filled 2021-01-12 (×2): qty 1

## 2021-01-12 MED ORDER — SODIUM CHLORIDE 0.9 % IV SOLN: 250.0000 mL | INTRAVENOUS | Status: DC | PRN

## 2021-01-12 MED ORDER — SPIRONOLACTONE 12.5 MG HALF TABLET: 12.5000 mg | ORAL_TABLET | Freq: Every day | ORAL | Status: DC

## 2021-01-12 MED ORDER — ACETAMINOPHEN 325 MG PO TABS: 650.0000 mg | ORAL_TABLET | ORAL | Status: DC | PRN

## 2021-01-12 NOTE — Plan of Care (Signed)
Admission information reviewed, education started and plan of care reviewed.

## 2021-01-12 NOTE — ED Triage Notes (Signed)
Pt reports SOB that has gotten worse and is accompanied by dizziness. Pt states that the dizziness is from the cough.  Pt has been treated for same at the [place].

## 2021-01-12 NOTE — ED Notes (Signed)
Implant Date: November 05, 2016. Serial number(s) [id number] and [id number] Model number [id number] and [id number]

## 2021-01-12 NOTE — ED Notes (Signed)
Placed on 2 L now at 93%

## 2021-01-12 NOTE — ED Provider Notes (Addendum)
[facility] EMERGENCY DEPT Provider Note   CSN: [id number] Arrival date & time: 01/12/21  8588     History Chief Complaint  Patient presents with   Shortness of Breath    Evan Serrano is a 70 y.o. male.  HPI     70 year old male with history of CHF with AICD placement comes in with chief complaint of cough, shortness of breath.  Patient reports that for the last several months he has had a cough.  He has been told that the cough is because of fluid around his heart and because of heart failure.  He has had bronchitis, and had taken antibiotics for it, but the cough has persisted.  Pt has no hx of PE, DVT and denies any exogenous hormone (testosterone / estrogen) use, long distance travels or surgery in the past 6 weeks, active cancer, recent immobilization.  With the cough patient has clear phlegm.  He has been taking his torsemide as prescribed.  He denies any new leg swelling but does indicate may be some weight gain over the past couple of weeks.  He denies any worsening orthopnea, PND -but reports that he has been coughing at baseline all night long.  In 2020, patient did develop COVID-19.  He however states that he recovered fully from COVID-19 perspective and that the cough came after the COVID-19 recovery.  Past Medical History:  Diagnosis Date   Cardiac pacemaker    Hypertension    Presence of cardiac defibrillator     Patient Active Problem List   Diagnosis Date Noted   CHF (congestive heart failure) (HCC) 01/12/2021   Orthostatic hypotension 04/20/2019   COVID-19 virus infection    Renal insufficiency    AKI (acute kidney injury) (HCC) 04/19/2019   Chronic HFrEF (heart failure with reduced ejection fraction) (HCC) 04/19/2019   Syncope due to orthostatic hypotension 04/19/2019    History reviewed. No pertinent surgical history.     No family history on file.  Social History   Tobacco Use   Smoking status: Never   Smokeless tobacco:  Never  Substance Use Topics   Alcohol use: Never   Drug use: Never    Home Medications Prior to Admission medications   Medication Sig Start Date End Date Taking? Authorizing Provider  aspirin 81 MG EC tablet Take 81 mg by mouth daily.   Yes [provider]  carvedilol (COREG) 12.5 MG tablet Take 6.25 mg by mouth 2 (two) times daily with a meal.   Yes [provider]  lisinopril (ZESTRIL) 5 MG tablet Take 5 mg by mouth daily.   Yes [provider]  simvastatin (ZOCOR) 10 MG tablet Take 10 mg by mouth at bedtime.   Yes [provider]  torsemide (DEMADEX) 20 MG tablet Take 0.5-1 tablets (10-20 mg total) by mouth See admin instructions. Take 10mg  by mouth daily.  If weight gain of 5lbs or more, then take 20mg  daily for two days.  If weight does not decrease, then see MD. 04/27/19  Yes , MD  Vitamin D, Cholecalciferol, 10 MCG (400 UNIT) CAPS Take 800 Units by mouth daily.   Yes [provider]  carvedilol (COREG) 6.25 MG tablet Take 1 tablet (6.25 mg total) by mouth 2 (two) times daily with a meal. Patient not taking: Reported on 01/12/2021 04/25/19   01/14/2021, MD  spironolactone (ALDACTONE) 25 MG tablet Take 0.5 tablets (12.5 mg total) by mouth daily. Patient not taking: Reported on 01/12/2021 04/27/19  Leroy Sea, MD    Allergies    Patient has no known allergies.  Review of Systems   Review of Systems  Constitutional:  Positive for activity change.  Respiratory:  Positive for cough and shortness of breath.   Cardiovascular:  Positive for chest pain.  Gastrointestinal:  Negative for nausea and vomiting.  Allergic/Immunologic: Negative for immunocompromised state.  Hematological:  Does not bruise/bleed easily.  All other systems reviewed and are negative.  Physical Exam Updated Vital Signs BP 107/76 (BP Location: Left Arm)   Pulse 68   Temp 97.9 F (36.6 C) (Oral)   Resp 20   Ht 5\' 8"  (1.727 m)   Wt  98.4 kg   SpO2 95%   BMI 32.98 kg/m   Physical Exam Vitals and nursing note reviewed.  Constitutional:      Appearance: He is well-developed.  HENT:     Head: Normocephalic and atraumatic.  Eyes:     Extraocular Movements: Extraocular movements intact.     Conjunctiva/sclera: Conjunctivae normal.  Neck:     Vascular: JVD present.  Cardiovascular:     Rate and Rhythm: Normal rate and regular rhythm.  Pulmonary:     Effort: Pulmonary effort is normal.     Breath sounds: Normal breath sounds. No decreased breath sounds, wheezing, rhonchi or rales.  Abdominal:     General: Bowel sounds are normal. There is no distension.     Palpations: Abdomen is soft. There is no mass.     Tenderness: There is no abdominal tenderness.  Musculoskeletal:        General: No deformity.     Cervical back: Normal range of motion and neck supple.     Right lower leg: No edema.     Left lower leg: No edema.  Skin:    General: Skin is warm.  Neurological:     Mental Status: He is alert and oriented to person, place, and time.    ED Results / Procedures / Treatments   Labs (all labs ordered are listed, but only abnormal results are displayed) Labs Reviewed  BASIC METABOLIC PANEL - Abnormal; Notable for the following components:      Result Value   Glucose, Bld 114 (*)    Creatinine, Ser 1.62 (*)    GFR, Estimated 45 (*)    All other components within normal limits  BRAIN NATRIURETIC PEPTIDE - Abnormal; Notable for the following components:   B Natriuretic Peptide 1,707.8 (*)    All other components within normal limits  CBC - Abnormal; Notable for the following components:   RBC 5.90 (*)    RDW 17.7 (*)    All other components within normal limits  PROTIME-INR - Abnormal; Notable for the following components:   Prothrombin Time 15.3 (*)    All other components within normal limits  CBC - Abnormal; Notable for the following components:   RDW 16.5 (*)    All other components within normal  limits  CREATININE, SERUM - Abnormal; Notable for the following components:   Creatinine, Ser 1.57 (*)    GFR, Estimated 47 (*)    All other components within normal limits  RESP PANEL BY RT-PCR (FLU A&B, COVID) ARPGX2  MAGNESIUM  HIV ANTIBODY (ROUTINE TESTING W REFLEX)    EKG EKG Interpretation  Date/Time:  Saturday January 12 2021 09:14:20 EDT Ventricular Rate:  72 PR Interval:  246 QRS Duration: 131 QT Interval:  432 QTC Calculation: 473 R Axis:   -65 Text  Interpretation: Sinus rhythm Ventricular premature complex Prolonged PR interval Left atrial enlargement Left bundle branch block No significant change since last tracing Confirmed by Derwood Kaplan [id number]) on 01/12/2021 10:00:03 AM  Radiology DG Chest Portable 1 View  Result Date: 01/12/2021 CLINICAL DATA:  Per ED notes: Pt reports SOB that has gotten worse and is accompanied by dizziness. Pt states that the dizziness is from the cough. Pt has been treated for same at the [place]. EXAM: PORTABLE CHEST - 1 VIEW COMPARISON:  none FINDINGS: Stable left subclavian AICD. Improved interstitial opacity since prior study. Heart size upper limits normal. Aortic Atherosclerosis (ICD10-170.0). No effusion.  No pneumothorax. Visualized bones unremarkable. IMPRESSION: Stable mild cardiomegaly.  No acute findings. Electronically Signed   By: Corlis Leak M.D.   On: 01/12/2021 10:47    Procedures Procedures   Medications Ordered in ED Medications  simvastatin (ZOCOR) tablet 10 mg (10 mg Oral Given 01/12/21 2031)  cholecalciferol (VITAMIN D) tablet 1,000 Units (has no administration in time range)  sodium chloride flush (NS) 0.9 % injection 3 mL (3 mLs Intravenous Given 01/12/21 2031)  sodium chloride flush (NS) 0.9 % injection 3 mL (has no administration in time range)  0.9 %  sodium chloride infusion (has no administration in time range)  acetaminophen (TYLENOL) tablet 650 mg (has no administration in time range)  ondansetron (ZOFRAN) injection 4  mg (has no administration in time range)  enoxaparin (LOVENOX) injection 40 mg (40 mg Subcutaneous Given 01/12/21 1648)  carvedilol (COREG) tablet 3.125 mg (0 mg Oral Hold 01/12/21 1648)  furosemide (LASIX) injection 40 mg (40 mg Intravenous Given 01/12/21 1838)  ipratropium-albuterol (DUONEB) 0.5-2.5 (3) MG/3ML nebulizer solution 3 mL (3 mLs Nebulization Given 01/12/21 2030)  furosemide (LASIX) injection 40 mg (40 mg Intravenous Given 01/12/21 1124)    ED Course  I have reviewed the triage vital signs and the nursing notes.  Pertinent labs & imaging results that were available during my care of the patient were reviewed by me and considered in my medical decision making (see chart for details).    MDM Rules/Calculators/A&P                           70 year old comes in with chief complaint of cough, dizziness.  Dizziness is associated with increased cough.  He also reports some shortness of breath, that has progressed over the last several months.  No orthopnea, PND-like symptoms but has cough at baseline even at nighttime.  Patient had COVID-19 long time back, but recovered from it.   Patient is nontoxic-appearing.  He does not have JVD, pitting edema, lung exam is fine.  Unsure etiology at this time for the chronic cough. We will get basic labs, chest x-ray and reassess. My suspicion is that patient might need follow-up with cardiology service for optimization or pulmonary doctors for his chronic cough.  He is typically managed at the [place].   Final Clinical Impression(s) / ED Diagnoses Final diagnoses:  Acute systolic congestive heart failure [facility])    Rx / DC Orders ED Discharge Orders          Ordered    Amb Referral to HF Clinic        01/12/21 1128             Derwood Kaplan, MD 01/13/21 3536    Derwood Kaplan, MD 01/13/21 0710

## 2021-01-12 NOTE — H&P (Addendum)
History and Physical    Evan Serrano EVO:[id number] DOB: [birth year] DOA: 01/12/2021  [facility]  Patient coming from: Home   Chief Complaint: Shortness of breath and dizziness  HPI: Evan Serrano is a 70 y.o. male with medical history significant of CHF, cardiac defibrillator, CKD stage IIIa, was transferred by [facility] due to evaluation of sob.  Patient recently visited [place] and had some sausage beef, then last night had canned salmon patti which he described had high salt content, then today noticed more sob from his baseline. He can only walk few steps before becoming sob.  He also reports since his cardiac medications were adjusted he notices more dizziness and weakness.He reports sometimes when he moves his head around gets dizziness.  Denies chest pain, syncope or presyncopal episodes.  Denies abdominal pain fever chills.  Has chronic cough with whitish phlegm.  ED Course: Blood pressure 116/85, afebrile, respiratory rate 18, heart rate 66, 99% room air.  Was given Lasix 40 mg IV x1. Chest x-ray personal review cardiomegaly no evidence of opacities  Significant labs creatinine 1.62, BNP elevated at 1707.8, potassium 4  EKG sinus rhythm, PVCs, LBBB Review of Systems: All systems reviewed and otherwise negative.    Past Medical History:  Diagnosis Date   Cardiac pacemaker    Hypertension    Presence of cardiac defibrillator     History reviewed. No pertinent surgical history.   reports that he has never smoked. He has never used smokeless tobacco. He reports that he does not drink alcohol and does not use drugs.  No Known Allergies  No family history on file.   Prior to Admission medications   Medication Sig Start Date End Date Taking? Authorizing Provider  aspirin 81 MG EC tablet Take 81 mg by mouth daily.   Yes [provider]  carvedilol (COREG) 12.5 MG tablet Take 6.25 mg by mouth 2 (two) times daily with a meal.   Yes [provider]  lisinopril (ZESTRIL) 5 MG tablet Take 5 mg by mouth daily.   Yes [provider]  simvastatin (ZOCOR) 10 MG tablet Take 10 mg by mouth at bedtime.   Yes [provider]  torsemide (DEMADEX) 20 MG tablet Take 0.5-1 tablets (10-20 mg total) by mouth See admin instructions. Take 10mg  by mouth daily.  If weight gain of 5lbs or more, then take 20mg  daily for two days.  If weight does not decrease, then see MD. 04/27/19  Yes , MD  Vitamin D, Cholecalciferol, 10 MCG (400 UNIT) CAPS Take 800 Units by mouth daily.   Yes [provider]  carvedilol (COREG) 6.25 MG tablet Take 1 tablet (6.25 mg total) by mouth 2 (two) times daily with a meal. Patient not taking: Reported on 01/12/2021 04/25/19   01/14/2021, MD  spironolactone (ALDACTONE) 25 MG tablet Take 0.5 tablets (12.5 mg total) by mouth daily. Patient not taking: Reported on 01/12/2021 04/27/19   01/14/2021, MD    Physical Exam: Vitals:   01/12/21 1230 01/12/21 1245 01/12/21 1300 01/12/21 1315  BP: (!) 85/66 103/84 101/82 101/78  Pulse: 64 64 60 64  Resp: 18 12 20  (!) 25  Temp:      TempSrc:      SpO2: 100% 100% 98% 95%  Weight:      Height:        Constitutional: NAD, calm, comfortable Vitals:   01/12/21 1230 01/12/21 1245 01/12/21 1300 01/12/21 1315  BP: 01/14/21)  85/66 103/84 101/82 101/78  Pulse: 64 64 60 64  Resp: 18 12 20  (!) 25  Temp:      TempSrc:      SpO2: 100% 100% 98% 95%  Weight:      Height:       Eyes: PERRL, lids and conjunctivae normal ENMT: Mucous membranes are moist. Poor dentition Neck: normal, supple, no masses Respiratory: clear to auscultation bilaterally, no wheezing, no crackles. Normal respiratory effort. No accessory muscle use.  Cardiovascular: Regular rate and rhythm,1/6 sm , no gallop No burits. Mild JVD.  No edema Abdomen: no tenderness, no masses palpated. No reducible umbilical hernia bowel sounds positive.  Musculoskeletal: no  clubbing / cyanosis. No joint deformity upper and lower extremities. Good ROM, no contractures. Normal muscle tone.  Skin: Warm dry Neurologic: CN 2-12 grossly intact. Sensation intact, Strength 5/5 in all 4.  Psychiatric: Normal judgment and insight. Alert and oriented x 3. Normal mood.    Labs on Admission: I have personally reviewed following labs and imaging studies  CBC: Recent Labs  Lab 01/12/21 0958  WBC 7.2  HGB 15.7  HCT 49.3  MCV 83.6  PLT 167   Basic Metabolic Panel: Recent Labs  Lab 01/12/21 0958  NA 139  K 4.0  CL 102  CO2 27  GLUCOSE 114*  BUN 20  CREATININE 1.62*  CALCIUM 9.6  MG 2.1   GFR: Estimated Creatinine Clearance: 49.2 mL/min (A) (by C-G formula based on SCr of 1.62 mg/dL (H)). Liver Function Tests: No results for input(s): AST, ALT, ALKPHOS, BILITOT, PROT, ALBUMIN in the last 168 hours. No results for input(s): LIPASE, AMYLASE in the last 168 hours. No results for input(s): AMMONIA in the last 168 hours. Coagulation Profile: Recent Labs  Lab 01/12/21 0958  INR 1.2   Cardiac Enzymes: No results for input(s): CKTOTAL, CKMB, CKMBINDEX, TROPONINI in the last 168 hours. BNP (last 3 results) No results for input(s): PROBNP in the last 8760 hours. HbA1C: No results for input(s): HGBA1C in the last 72 hours. CBG: No results for input(s): GLUCAP in the last 168 hours. Lipid Profile: No results for input(s): CHOL, HDL, LDLCALC, TRIG, CHOLHDL, LDLDIRECT in the last 72 hours. Thyroid Function Tests: No results for input(s): TSH, T4TOTAL, FREET4, T3FREE, THYROIDAB in the last 72 hours. Anemia Panel: No results for input(s): VITAMINB12, FOLATE, FERRITIN, TIBC, IRON, RETICCTPCT in the last 72 hours. Urine analysis:    Component Value Date/Time   COLORURINE YELLOW 04/19/2019 1141   APPEARANCEUR HAZY (A) 04/19/2019 1141   LABSPEC 1.016 04/19/2019 1141   PHURINE 5.0 04/19/2019 1141   GLUCOSEU NEGATIVE 04/19/2019 1141   HGBUR NEGATIVE 04/19/2019  1141   BILIRUBINUR NEGATIVE 04/19/2019 1141   KETONESUR NEGATIVE 04/19/2019 1141   PROTEINUR 30 (A) 04/19/2019 1141   NITRITE NEGATIVE 04/19/2019 1141   LEUKOCYTESUR SMALL (A) 04/19/2019 1141    Radiological Exams on Admission: DG Chest Portable 1 View  Result Date: 01/12/2021 CLINICAL DATA:  Per ED notes: Pt reports SOB that has gotten worse and is accompanied by dizziness. Pt states that the dizziness is from the cough. Pt has been treated for same at the 01/14/2021. EXAM: PORTABLE CHEST - 1 VIEW COMPARISON:  none FINDINGS: Stable left subclavian AICD. Improved interstitial opacity since prior study. Heart size upper limits normal. Aortic Atherosclerosis (ICD10-170.0). No effusion.  No pneumothorax. Visualized bones unremarkable. IMPRESSION: Stable mild cardiomegaly.  No acute findings. Electronically Signed   By: [place] M.D.   On: 01/12/2021 10:47  Assessment/Plan Active Problems:   CHF (congestive heart failure) (HCC)   1.Acute CHF-presuming to be systolic as patient is on cardiac meds and has AICD Likely due to eating salty food last night.  Do not have a baseline echo cardiogram to evaluate his EF and diastolic dysfunction Will obtain echo Lasix IV twice daily Hold Aldactone, reduce carvedilol to allow to handle diuresis Daily weight and I's and O's   2. Dizzines/weakness Reports after having cardiac meds adjusted and experiencing the same.  Could be due to his beta-blockers and possible low BP. May have component vertigo? But will hold off on initiating meclizine Currently decreased carvedilol since getting IV Lasix Monitor on telemetry to rule out arrhythmia PT OT  3.CKD stage IIIa Currently at baseline Monitor closely with diuresis  5.HTN- continue beta blk. Hold other meds since starting lasix iv as bp may not tolerate   DVT prophylaxis: Lovenox Code Status: Full Family Communication: None at bedside Disposition Plan: None Consults called: None Admission  status: Inpatient as patient requires more than 2 midnight stays for current severity of illness   Lynn Ito MD [facility]   If 7PM-7AM, please contact night-coverage [email or web address] Password [facility]  01/12/2021, 3:09 PM

## 2021-01-12 NOTE — ED Notes (Signed)
Urinal at bedside, plan of care discussed.

## 2021-01-13 ENCOUNTER — Inpatient Hospital Stay (HOSPITAL_COMMUNITY): Payer: No Typology Code available for payment source

## 2021-01-13 DIAGNOSIS — N189 Chronic kidney disease, unspecified: Secondary | ICD-10-CM

## 2021-01-13 DIAGNOSIS — I5021 Acute systolic (congestive) heart failure: Secondary | ICD-10-CM | POA: Diagnosis not present

## 2021-01-13 LAB — ECHOCARDIOGRAM COMPLETE
Area-P 1/2: 5.31 cm2
Height: 68 in
P 1/2 time: 472 msec
S' Lateral: 6.5 cm
Single Plane A4C EF: 29.6 %
Weight: 3470.4 oz

## 2021-01-13 NOTE — Evaluation (Signed)
Physical Therapy Evaluation Patient Details Name: Evan Serrano MRN: [id number] DOB: May 09, 1951 Today's Date: 01/13/2021   History of Present Illness  70 y.o. male with medical history significant of CHF, cardiac defibrillator, CKD stage IIIa, was transferred by [facility] due to evaluation of sob. Diagnosed with acute CHF.  Clinical Impression  Pt admitted with above diagnosis. Mod I with bed mobility and transfers. Ambulates without assistive device, supervision for safety while monitoring vitals today. Sans supplemental O2 pt 87% SpO2, with 2L pt 99% while ambulating. BP with slight drop after ambulating but asymptomatic (see below.) Pt currently with functional limitations due to the deficits listed below (see PT Problem List). Pt will benefit from skilled PT to increase their independence and safety with mobility to allow discharge to the venue listed below.       Follow Up Recommendations No PT follow up    Equipment Recommendations  None recommended by PT    Recommendations for Other Services       Precautions / Restrictions Precautions Precautions: None Precaution Comments: monitor vitals Restrictions Weight Bearing Restrictions: No      Mobility  Bed Mobility Overal bed mobility: Modified Independent             General bed mobility comments: extra time    Transfers Overall transfer level: Modified independent Equipment used: None             General transfer comment: Stable upon rising from bed  Ambulation/Gait Ambulation/Gait assistance: Supervision Gait Distance (Feet): 135 Feet Assistive device: None Gait Pattern/deviations: Step-through pattern Gait velocity: decreased   General Gait Details: Ambulates without AD, slightly decreased speed, trunk slightly flexed. Supervision for safety, monitoring vitals during ambulatory bout. SpO2 87% briefly without supplemental O2, 99% on 2L. No overt loss of balance noted during gait.  Stairs             Wheelchair Mobility    Modified Rankin (Stroke Patients Only)       Balance Overall balance assessment: Mild deficits observed, not formally tested                                           Pertinent Vitals/Pain Pain Assessment: No/denies pain    Home Living Family/patient expects to be discharged to:: Private residence Living Arrangements: Spouse/significant other Available Help at Discharge: Family Type of Home: Apartment Home Access: Stairs to enter Entrance Stairs-Rails: Right Entrance Stairs-Number of Steps: 14 Home Layout: One level Home Equipment: Cane - single point;Walker - 4 wheels      Prior Function Level of Independence: Independent         Comments: Stopped working in march however ind otherwise     Higher education careers adviser   Dominant Hand: Right    Extremity/Trunk Assessment   Upper Extremity Assessment Upper Extremity Assessment: Defer to OT evaluation    Lower Extremity Assessment Lower Extremity Assessment: Generalized weakness       Communication   Communication: No difficulties  Cognition Arousal/Alertness: Awake/alert Behavior During Therapy: WFL for tasks assessed/performed Overall Cognitive Status: Within Functional Limits for tasks assessed                                        General Comments General comments (skin integrity, edema, etc.): At rest: SpO2 96% on 1.5L ;  BP 109/70 ; HR 72 -- After ambulating SpO2 100% on 1.5L, HR 71, BP 94/77    Exercises     Assessment/Plan    PT Assessment Patient needs continued PT services  PT Problem List Decreased strength;Decreased activity tolerance;Decreased balance;Decreased mobility;Cardiopulmonary status limiting activity;Obesity       PT Treatment Interventions DME instruction;Gait training;Stair training;Functional mobility training;Therapeutic activities;Therapeutic exercise;Balance training;Patient/family education    PT Goals (Current goals  can be found in the Care Plan section)  Acute Rehab PT Goals Patient Stated Goal: Stop coughing PT Goal Formulation: With patient Time For Goal Achievement: 01/27/21 Potential to Achieve Goals: Good    Frequency Min 3X/week   Barriers to discharge        Co-evaluation               AM-PAC PT "6 Clicks" Mobility  Outcome Measure Help needed turning from your back to your side while in a flat bed without using bedrails?: None Help needed moving from lying on your back to sitting on the side of a flat bed without using bedrails?: None Help needed moving to and from a bed to a chair (including a wheelchair)?: None Help needed standing up from a chair using your arms (e.g., wheelchair or bedside chair)?: None Help needed to walk in hospital room?: None Help needed climbing 3-5 steps with a railing? : A Little 6 Click Score: 23    End of Session Equipment Utilized During Treatment: Gait belt;Oxygen Activity Tolerance: Patient tolerated treatment well Patient left: in bed;with call bell/phone within reach;Other (comment) (MD in room)   PT Visit Diagnosis: Muscle weakness (generalized) (M62.81);Difficulty in walking, not elsewhere classified (R26.2);Dizziness and giddiness (R42)    Time: 5670-1410 PT Time Calculation (min) (ACUTE ONLY): 27 min   Charges:   PT Evaluation $PT Eval Low Complexity: 1 Low PT Treatments $Gait Training: 8-22 mins        Berton Mount, PT, DPT  Berton Mount 01/13/2021, 10:20 AM

## 2021-01-13 NOTE — Progress Notes (Signed)
PROGRESS NOTE    Evan Serrano  JSH:[id number] DOB: 1950-08-10 DOA: 01/12/2021 PCP: [facility]    Chief Complaint  Patient presents with   Shortness of Breath    Brief Narrative:   HPI: Evan Serrano is a 70 y.o. male with medical history significant of CHF, cardiac defibrillator, CKD stage IIIa, was transferred by [facility] due to evaluation of sob.  Patient recently visited [place] and had some sausage beef, then last night had canned salmon patti which he described had high salt content, then today noticed more sob from his baseline. He can only walk few steps before becoming sob.  He also reports since his cardiac medications were adjusted he notices more dizziness and weakness.He reports sometimes when he moves his head around gets dizziness.  Denies chest pain, syncope or presyncopal episodes.  Denies abdominal pain fever chills.  Has chronic cough with whitish phlegm.   ED Course: Blood pressure 116/85, afebrile, respiratory rate 18, heart rate 66, 99% room air.  Was given Lasix 40 mg IV x1. Chest x-ray personal review cardiomegaly no evidence of opacities   Significant labs creatinine 1.62, BNP elevated at 1707.8, potassium 4   EKG sinus rhythm, PVCs, LBBB Assessment & Plan:   Active Problems:   CHF (congestive heart failure) (HCC)     Acute on chronic CHF,  -With most likely systolic CHF given he has AICD, he is from [place] hospital, following at [facility] cardiology clinic, able to review any records on epic . -Volume status appears to be improving, but he is still with some evidence of volume overload with bibasilar crackles, lower extremity edema and some dyspnea, continue with IV Lasix 40 mg IV twice daily, and continue with daily weights, strict ins and out. -Sinew with beta-blocker at a lower dose given soft blood pressure, resume Aldactone in 24 hours if kidney function is stable and blood pressure remained stable after further IV diuresis. -Unclear  why patient is not on Entresto, but I will hold on starting as patient with soft blood pressure and elevated creatinine.. -Patient already has AICD -Follow on 2D echo. Daily weight and I's and O's      Dizzines/weakness Reports after having cardiac meds adjusted and experiencing the same.  Could be due to his beta-blockers and possible low BP. May have component vertigo? But will hold off on initiating meclizine Currently decreased carvedilol since getting IV Lasix Monitor on telemetry to rule out arrhythmia PT OT  CKD stage IIIa Currently at baseline Monitor closely with diuresis  HTN -See discussion above under CHF section.   DVT prophylaxis:Lovenox Code Status: Full Family Communication: Have discussed with the patient, none at bedside. Disposition:   Status is: Inpatient  Remains inpatient appropriate because:IV treatments appropriate due to intensity of illness or inability to take PO  Dispo: The patient is from: Home              Anticipated d/c is to: Home              Patient currently is not medically stable to d/c.  Mains on IV diuresis, required frequent adjustment if diuresis, cardiac medications given soft blood pressure and tenuous renal function.  Ry   Difficult to place patient No       Consultants:  NONE   Subjective:  Reports is feeling better today, dyspnea has improved, he denies any chest pain.  Objective: Vitals:   01/12/21 2034 01/13/21 0504 01/13/21 0808 01/13/21 1157  BP: 98/82 107/76  109/89 103/83  Pulse: 66 68  65  Resp: 20 20 20 20   Temp: 97.7 F (36.5 C) 97.9 F (36.6 C) 97.9 F (36.6 C) (!) 97.5 F (36.4 C)  TempSrc: Oral Oral Oral Oral  SpO2: 99% 95%  93%  Weight:  98.4 kg    Height:        Intake/Output Summary (Last 24 hours) at 01/13/2021 1429 Last data filed at 01/13/2021 1429 Gross per 24 hour  Intake 380 ml  Output 3600 ml  Net -3220 ml   Filed Weights   01/12/21 0926 01/13/21 0504  Weight: 102.1 kg 98.4 kg     Examination:  General exam: Appears calm and comfortable  Respiratory system: He has mild crackles at the bases, but no use of accessory muscles. Cardiovascular system: S1 & S2 heard, RRR. No JVD, murmurs, rubs, gallops or clicks.  Trace edema. Gastrointestinal system: Abdomen is nondistended, soft and nontender. No organomegaly or masses felt. Normal bowel sounds heard. Central nervous system: Alert and oriented. No focal neurological deficits. Extremities: Symmetric 5 x 5 power. Skin: No rashes, lesions or ulcers Psychiatry: Judgement and insight appear normal. Mood & affect appropriate.     Data Reviewed: I have personally reviewed following labs and imaging studies  CBC: Recent Labs  Lab 01/12/21 0958 01/12/21 1513  WBC 7.2 7.2  HGB 15.7 15.4  HCT 49.3 47.8  MCV 83.6 83.4  PLT 167 153    Basic Metabolic Panel: Recent Labs  Lab 01/12/21 0958 01/12/21 1513  NA 139  --   K 4.0  --   CL 102  --   CO2 27  --   GLUCOSE 114*  --   BUN 20  --   CREATININE 1.62* 1.57*  CALCIUM 9.6  --   MG 2.1  --     GFR: Estimated Creatinine Clearance: 49.8 mL/min (A) (by C-G formula based on SCr of 1.57 mg/dL (H)).  Liver Function Tests: No results for input(s): AST, ALT, ALKPHOS, BILITOT, PROT, ALBUMIN in the last 168 hours.  CBG: No results for input(s): GLUCAP in the last 168 hours.   Recent Results (from the past 240 hour(s))  Resp Panel by RT-PCR (Flu A&B, Covid) Nasopharyngeal Swab     Status: None   Collection Time: 01/12/21 11:04 AM   Specimen: Nasopharyngeal Swab; Nasopharyngeal(NP) swabs in vial transport medium  Result Value Ref Range Status   SARS Coronavirus 2 by RT PCR NEGATIVE NEGATIVE Final    Comment: (NOTE) SARS-CoV-2 target nucleic acids are NOT DETECTED.  The SARS-CoV-2 RNA is generally detectable in upper respiratory specimens during the acute phase of infection. The lowest concentration of SARS-CoV-2 viral copies this assay can detect is 138  copies/mL. A negative result does not preclude SARS-Cov-2 infection and should not be used as the sole basis for treatment or other patient management decisions. A negative result may occur with  improper specimen collection/handling, submission of specimen other than nasopharyngeal swab, presence of viral mutation(s) within the areas targeted by this assay, and inadequate number of viral copies(<138 copies/mL). A negative result must be combined with clinical observations, patient history, and epidemiological information. The expected result is Negative.  Fact Sheet for Patients:  01/14/21  Fact Sheet for Healthcare Providers:  BloggerCourse.com  This test is no t yet approved or cleared by the [employer] FDA and  has been authorized for detection and/or diagnosis of SARS-CoV-2 by FDA under an Emergency Use Authorization (EUA). This EUA will remain  in  effect (meaning this test can be used) for the duration of the COVID-19 declaration under Section 564(b)(1) of the Act, 21 U.S.C.section 360bbb-3(b)(1), unless the authorization is terminated  or revoked sooner.       Influenza A by PCR NEGATIVE NEGATIVE Final   Influenza B by PCR NEGATIVE NEGATIVE Final    Comment: (NOTE) The Xpert Xpress SARS-CoV-2/FLU/RSV plus assay is intended as an aid in the diagnosis of influenza from Nasopharyngeal swab specimens and should not be used as a sole basis for treatment. Nasal washings and aspirates are unacceptable for Xpert Xpress SARS-CoV-2/FLU/RSV testing.  Fact Sheet for Patients: BloggerCourse.com  Fact Sheet for Healthcare Providers: SeriousBroker.it  This test is not yet approved or cleared by the [place] FDA and has been authorized for detection and/or diagnosis of SARS-CoV-2 by FDA under an Emergency Use Authorization (EUA). This EUA will remain in effect (meaning  this test can be used) for the duration of the COVID-19 declaration under Section 564(b)(1) of the Act, 21 U.S.C. section 360bbb-3(b)(1), unless the authorization is terminated or revoked.  Performed at [facility], [address], [place], [place] [address]          Radiology Studies: DG Chest Portable 1 View  Result Date: 01/12/2021 CLINICAL DATA:  Per ED notes: Pt reports SOB that has gotten worse and is accompanied by dizziness. Pt states that the dizziness is from the cough. Pt has been treated for same at the [place]. EXAM: PORTABLE CHEST - 1 VIEW COMPARISON:  none FINDINGS: Stable left subclavian AICD. Improved interstitial opacity since prior study. Heart size upper limits normal. Aortic Atherosclerosis (ICD10-170.0). No effusion.  No pneumothorax. Visualized bones unremarkable. IMPRESSION: Stable mild cardiomegaly.  No acute findings. Electronically Signed   By: Corlis Leak M.D.   On: 01/12/2021 10:47        Scheduled Meds:  carvedilol  3.125 mg Oral BID WC   cholecalciferol  1,000 Units Oral Daily   enoxaparin (LOVENOX) injection  40 mg Subcutaneous Q24H   furosemide  40 mg Intravenous BID   simvastatin  10 mg Oral QHS   sodium chloride flush  3 mL Intravenous Q12H   Continuous Infusions:  sodium chloride       LOS: 1 day      Huey Bienenstock, MD [facility]   To contact the attending provider between 7A-7P or the covering provider during after hours 7P-7A, please log into the web site [email or web address] and access using universal [place] password for that web site. If you do not have the password, please call the hospital operator.  01/13/2021, 2:30 PM

## 2021-01-13 NOTE — Progress Notes (Signed)
Pt was assessed with auditory wheezing in right upper lobe w/ incessant cough. Evan Aye, MD made aware; see new orders. Pt stated that he felt relief after PRN neb administered. Will continue to monitor.   Bari Edward, RN

## 2021-01-13 NOTE — Progress Notes (Signed)
Occupational Therapy Evaluation Patient Details Name: Evan Serrano MRN: [id number] DOB: 12/13/1950 Today's Date: 01/13/2021    History of Present Illness 70 y.o. male with medical history significant of CHF, cardiac defibrillator, CKD stage IIIa, was transferred by [facility] due to evaluation of sob. Diagnosed with acute CHF.   Clinical Impression   Evan Serrano was evaluated for the above admission. PTA pt was mod I with all mobility and ADLs, he states that he furniture walking in his home, and uses Serrano cane for community mobility; otherwise indep with ADLs and drives. Upon evaluation pt was supervision level for functional mobility and all ADLs without AD/DME. His vital signs stayed stable on 1.5L nasal canula. Pt does not required further OT acutely. Recommend d/c home with supervision initially for all ADLs and mobility.   BP supine 99/79, SpO2 94 BP sitting 98/75, SpO2 94 BP standing 108/79, SpO2 92    Follow Up Recommendations  No OT follow up;Supervision - Intermittent    Equipment Recommendations  None recommended by OT       Precautions / Restrictions Precautions Precautions: None Precaution Comments: monitor vitals Restrictions Weight Bearing Restrictions: No      Mobility Bed Mobility Overal bed mobility: Modified Independent             General bed mobility comments: extra time    Transfers Overall transfer level: Modified independent Equipment used: None             General transfer comment: Stable upon rising from bed    Balance Overall balance assessment: No apparent balance deficits (not formally assessed)       ADL either performed or assessed with clinical judgement   ADL Overall ADL's : Needs assistance/impaired Eating/Feeding: Independent;Sitting   Grooming: Supervision/safety;Standing   Upper Body Bathing: Supervision/ safety;Sitting   Lower Body Bathing: Supervison/ safety;Sit to/from stand   Upper Body Dressing :  Supervision/safety;Sitting   Lower Body Dressing: Supervision/safety;Sit to/from stand   Toilet Transfer: Supervision/safety;Ambulation;Grab bars   Toileting- Clothing Manipulation and Hygiene: Supervision/safety;Sit to/from stand       Functional mobility during ADLs: Supervision/safety;Cueing for safety General ADL Comments: superivison for all ADLs this session for safety, pt did not have any LOB during lower body dressing or ambulation without AD     Vision Patient Visual Report: No change from baseline Vision Assessment?: No apparent visual deficits     Perception Perception Perception Tested?: No   Praxis      Pertinent Vitals/Pain Pain Assessment: No/denies pain     Hand Dominance Right   Extremity/Trunk Assessment Upper Extremity Assessment Upper Extremity Assessment: Overall WFL for tasks assessed   Lower Extremity Assessment Lower Extremity Assessment: Defer to PT evaluation   Cervical / Trunk Assessment Cervical / Trunk Assessment: Normal   Communication Communication Communication: No difficulties   Cognition Arousal/Alertness: Awake/alert Behavior During Therapy: WFL for tasks assessed/performed Overall Cognitive Status: Within Functional Limits for tasks assessed               General Comments  SpO2 92-94% entire session with 1.5L nasal canual. BP stable with change of position            Home Living Family/patient expects to be discharged to:: Private residence Living Arrangements: Spouse/significant other Available Help at Discharge: Family Type of Home: Apartment Home Access: Stairs to enter Secretary/administrator of Steps: 16 Entrance Stairs-Rails: Right Home Layout: One level     Bathroom Shower/Tub: Chief Strategy Officer: Standard Bathroom Accessibility: Yes  Home Equipment: Gilmer Mor - single point;Walker - 4 wheels          Prior Functioning/Environment Level of Independence: Independent        Comments:  Pt reports furniture walking in his home        OT Problem List: Decreased activity tolerance;Impaired balance (sitting and/or standing);Decreased knowledge of precautions;Pain      OT Treatment/Interventions:      OT Goals(Current goals can be found in the care plan section) Acute Rehab OT Goals Patient Stated Goal: Stop coughing OT Goal Formulation: All assessment and education complete, DC therapy   AM-PAC OT "6 Clicks" Daily Activity     Outcome Measure Help from another person eating meals?: None Help from another person taking care of personal grooming?: Serrano Little Help from another person toileting, which includes using toliet, bedpan, or urinal?: None Help from another person bathing (including washing, rinsing, drying)?: Serrano Little Help from another person to put on and taking off regular upper body clothing?: None Help from another person to put on and taking off regular lower body clothing?: None 6 Click Score: 22   End of Session Equipment Utilized During Treatment: Oxygen Nurse Communication: Mobility status  Activity Tolerance: Patient tolerated treatment well Patient left: in chair;with call bell/phone within reach  OT Visit Diagnosis: Unsteadiness on feet (R26.81);Muscle weakness (generalized) (M62.81);Pain                Time: 1021-1036 OT Time Calculation (min): 15 min Charges:  OT General Charges $OT Visit: 1 Visit OT Evaluation $OT Eval Moderate Complexity: 1 Mod   Evan Serrano Evan Serrano 01/13/2021, 11:04 AM

## 2021-01-13 NOTE — Progress Notes (Signed)
  Echocardiogram 2D Echocardiogram has been performed.  Pieter Partridge 01/13/2021, 11:56 AM

## 2021-01-14 DIAGNOSIS — N289 Disorder of kidney and ureter, unspecified: Secondary | ICD-10-CM

## 2021-01-14 DIAGNOSIS — E440 Moderate protein-calorie malnutrition: Secondary | ICD-10-CM | POA: Insufficient documentation

## 2021-01-14 DIAGNOSIS — I5021 Acute systolic (congestive) heart failure: Secondary | ICD-10-CM

## 2021-01-14 LAB — BASIC METABOLIC PANEL
Anion gap: 7 (ref 5–15)
BUN: 19 mg/dL (ref 8–23)
CO2: 34 mmol/L — ABNORMAL HIGH (ref 22–32)
Calcium: 9.7 mg/dL (ref 8.9–10.3)
Chloride: 97 mmol/L — ABNORMAL LOW (ref 98–111)
Creatinine, Ser: 1.35 mg/dL — ABNORMAL HIGH (ref 0.61–1.24)
GFR, Estimated: 56 mL/min — ABNORMAL LOW (ref >60)
Glucose, Bld: 95 mg/dL (ref 70–99)
Potassium: 4.1 mmol/L (ref 3.5–5.1)
Sodium: 138 mmol/L (ref 135–145)

## 2021-01-14 MED ORDER — LOSARTAN POTASSIUM 25 MG PO TABS: 25.0000 mg | ORAL_TABLET | Freq: Every day | ORAL | 1 refills | Status: DC

## 2021-01-14 MED ORDER — ENSURE ENLIVE PO LIQD: 237.0000 mL | Freq: Two times a day (BID) | ORAL | Status: DC

## 2021-01-14 MED ORDER — ENSURE ENLIVE PO LIQD
237.0000 mL | Freq: Two times a day (BID) | ORAL | 12 refills | Status: DC
Start: 2021-01-14 — End: 2021-09-19

## 2021-01-14 MED ORDER — ADULT MULTIVITAMIN W/MINERALS CH: 1.0000 | ORAL_TABLET | Freq: Every day | ORAL | Status: DC

## 2021-01-14 MED ORDER — ADULT MULTIVITAMIN W/MINERALS CH: 1.0000 | ORAL_TABLET | Freq: Every day | ORAL | Status: AC

## 2021-01-14 NOTE — Progress Notes (Signed)
Pt had a 22-beat run of V-tach. Asymptomatic and resting comfortably, no chest pain or shortness of breath. Margo Aye, MD made aware. Will continue to monitor.   Bari Edward, RN

## 2021-01-14 NOTE — Discharge Summary (Signed)
Physician Discharge Summary  Quita Skyelexander Shoberg NWG:[id number] DOB: 03-08-1951 DOA: 01/12/2021  PCP: [facility]  Admit date: 01/12/2021 Discharge date: 01/14/2021  Admitted From: Home Disposition:  Home  Recommendations for Outpatient Follow-up:  Follow up with PCP in 1-2 weeks Please obtain BMP/CBC in one week Consider switching losartan to Entresto if blood pressure remains stable  Home Health:NO Equipment/Devices:none  Discharge Condition:Stable CODE STATUS:FULL Diet recommendation: Heart Healthy   Brief/Interim Summary:  Acute on chronic systolic CHF,  -No previous records for this patient at our healthcare system given he is a [facility] patient, but it was likely systolic CHF given he has AICD, he is from [facility], [facility], unable to review any records on epic . -Volume status appears to be improving, significantly improved with IV Lasix 40 mg IV twice daily, he was kept with daily weights, strict ins and outs.   -2D echo showing EF 20 to 25%, patient cannot recall exactly what is his baseline EF, but report he was always told it was low, -We will continue with his Coreg, resume Aldactone, patient blood pressure is soft, so I will hold on starting Entresto, but I will switch his lisinopril to losartan, so it can be easier done as an outpatient if his blood pressure felt strong enough to support Entresto. -Patient already has AICD      Dizzines/weakness -Significantly improved during hospital stay, seen by PT/OT, no need for home health.  CKD stage IIIa - Currently at baseline  HTN -See discussion above under CHF section.  Discharge Diagnoses:  Active Problems:   Renal insufficiency   CHF (congestive heart failure) (HCC)   Acute systolic congestive heart failure [facility](HCC)    Discharge Instructions  Discharge Instructions     Amb Referral to HF Clinic   Complete by: As directed    Diet - low sodium heart healthy    Complete by: As directed    Discharge instructions   Complete by: As directed    Follow with Primary MD [facility] in 7 days   Get CBC, CMP,  checked  by Primary MD next visit.    Activity: As tolerated with Full fall precautions use walker/cane & assistance as needed   Disposition Home    Diet: Heart Healthy .  For Heart failure patients - Check your Weight same time everyday, if you gain over 2 pounds, or you develop in leg swelling, experience more shortness of breath or chest pain, call your Primary MD immediately. Follow Cardiac Low Salt Diet and 1.5 lit/day fluid restriction.   On your next visit with your primary care physician please Get Medicines reviewed and adjusted.   Please request your Prim.MD to go over all Hospital Tests and Procedure/Radiological results at the follow up, please get all Hospital records sent to your Prim MD by signing hospital release before you go home.   If you experience worsening of your admission symptoms, develop shortness of breath, life threatening emergency, suicidal or homicidal thoughts you must seek medical attention immediately by calling 911 or calling your MD immediately  if symptoms less severe.  You Must read complete instructions/literature along with all the possible adverse reactions/side effects for all the Medicines you take and that have been prescribed to you. Take any new Medicines after you have completely understood and accpet all the possible adverse reactions/side effects.   Do not drive, operating heavy machinery, perform activities at heights, swimming or participation in water activities  or provide baby sitting services if your were admitted for syncope or siezures until you have seen by Primary MD or a Neurologist and advised to do so again.  Do not drive when taking Pain medications.    Do not take more than prescribed Pain, Sleep and Anxiety Medications  Special Instructions: If you have smoked or  chewed Tobacco  in the last 2 yrs please stop smoking, stop any regular Alcohol  and or any Recreational drug use.  Wear Seat belts while driving.   Please note  You were cared for by a hospitalist during your hospital stay. If you have any questions about your discharge medications or the care you received while you were in the hospital after you are discharged, you can call the unit and asked to speak with the hospitalist on call if the hospitalist that took care of you is not available. Once you are discharged, your primary care physician will handle any further medical issues. Please note that NO REFILLS for any discharge medications will be authorized once you are discharged, as it is imperative that you return to your primary care physician (or establish a relationship with a primary care physician if you do not have one) for your aftercare needs so that they can reassess your need for medications and monitor your lab values.   Increase activity slowly   Complete by: As directed       Allergies as of 01/14/2021   No Known Allergies      Medication List     STOP taking these medications    lisinopril 5 MG tablet Commonly known as: ZESTRIL       TAKE these medications    aspirin 81 MG EC tablet Take 81 mg by mouth daily.   carvedilol 12.5 MG tablet Commonly known as: COREG Take 6.25 mg by mouth 2 (two) times daily with a meal. What changed: Another medication with the same name was removed. Continue taking this medication, and follow the directions you see here.   feeding supplement Liqd Take 237 mLs by mouth 2 (two) times daily between meals.   losartan 25 MG tablet Commonly known as: Cozaar Take 1 tablet (25 mg total) by mouth daily.   multivitamin with minerals Tabs tablet Take 1 tablet by mouth daily.   simvastatin 10 MG tablet Commonly known as: ZOCOR Take 10 mg by mouth at bedtime.   spironolactone 25 MG tablet Commonly known as: ALDACTONE Take 0.5  tablets (12.5 mg total) by mouth daily.   torsemide 20 MG tablet Commonly known as: DEMADEX Take 0.5-1 tablets (10-20 mg total) by mouth See admin instructions. Take 10mg  by mouth daily.  If weight gain of 5lbs or more, then take 20mg  daily for two days.  If weight does not decrease, then see MD.   Vitamin D (Cholecalciferol) 10 MCG (400 UNIT) Caps Take 800 Units by mouth daily.        Follow-up Information     [facility] Follow [facility].   Specialty: General Practice Contact information: [address] [place] [address] [phone]                No Known Allergies  Consultations: none   Procedures/Studies: DG Chest Portable 1 View  Result Date: 01/12/2021 CLINICAL DATA:  Per ED notes: Pt reports SOB that has gotten worse and is accompanied by dizziness. Pt states that the dizziness is from the cough. Pt has been treated for same at the [place]. EXAM:  PORTABLE CHEST - 1 VIEW COMPARISON:  none FINDINGS: Stable left subclavian AICD. Improved interstitial opacity since prior study. Heart size upper limits normal. Aortic Atherosclerosis (ICD10-170.0). No effusion.  No pneumothorax. Visualized bones unremarkable. IMPRESSION: Stable mild cardiomegaly.  No acute findings. Electronically Signed   By: Corlis Leak M.D.   On: 01/12/2021 10:47   ECHOCARDIOGRAM COMPLETE  Result Date: 01/13/2021    ECHOCARDIOGRAM REPORT   Patient Name:   Evan Serrano Date of Exam: 01/13/2021 Medical Rec #:  [id number]        Height:       68.0 in Accession #:    [id number]       Weight:       216.9 lb Date of Birth:  03-25-1951       BSA:          2.115 m Patient Age:    69 years         BP:           109/89 mmHg Patient Gender: M                HR:           69 bpm. Exam Location:  Inpatient Procedure: 2D Echo, Cardiac Doppler and Color Doppler Indications:    CHF-Acute Systolic I50.21  History:        Patient has no prior history of Echocardiogram examinations.                 CHF, Defibrillator;  Risk Factors:Non-Smoker and Hypertension.  Sonographer:    Lavenia Atlas RDCS Referring Phys: [id number] [facility] AMERY IMPRESSIONS  1. Left ventricular ejection fraction, by estimation, is 20 to 25%. The left ventricle has severely decreased function. The left ventricle demonstrates global hypokinesis. The left ventricular internal cavity size was severely dilated. Left ventricular diastolic parameters are consistent with Grade III diastolic dysfunction (restrictive). Elevated left ventricular end-diastolic pressure. The E/e' is 23.  2. Right ventricular systolic function is mildly reduced. The right ventricular size is mildly enlarged. There is mildly elevated pulmonary artery systolic pressure. The estimated right ventricular systolic pressure is 39.7 mmHg.  3. Left atrial size was moderately dilated.  4. The mitral valve is abnormal. Mild to moderate mitral valve regurgitation.  5. The aortic valve is tricuspid. Aortic valve regurgitation is mild. Mild aortic valve sclerosis is present, with no evidence of aortic valve stenosis. Aortic regurgitation PHT measures 472 msec.  6. The inferior vena cava is normal in size with greater than 50% respiratory variability, suggesting right atrial pressure of 3 mmHg. Comparison(s): No prior Echocardiogram. FINDINGS  Left Ventricle: Left ventricular ejection fraction, by estimation, is 20 to 25%. The left ventricle has severely decreased function. The left ventricle demonstrates global hypokinesis. The left ventricular internal cavity size was severely dilated. There is no left ventricular hypertrophy. Left ventricular diastolic parameters are consistent with Grade III diastolic dysfunction (restrictive). Elevated left ventricular end-diastolic pressure. The E/e' is 34. Right Ventricle: The right ventricular size is mildly enlarged. No increase in right ventricular wall thickness. Right ventricular systolic function is mildly reduced. There is mildly elevated pulmonary artery  systolic pressure. The tricuspid regurgitant  velocity is 3.03 m/s, and with an assumed right atrial pressure of 3 mmHg, the estimated right ventricular systolic pressure is 39.7 mmHg. Left Atrium: Left atrial size was moderately dilated. Right Atrium: Right atrial size was normal in size. Pericardium: There is no evidence of pericardial effusion. Mitral Valve: The mitral valve is abnormal.  There is moderate thickening of the mitral valve leaflet(s). Mild to moderate mitral valve regurgitation. Tricuspid Valve: The tricuspid valve is grossly normal. Tricuspid valve regurgitation is trivial. Aortic Valve: The aortic valve is tricuspid. Aortic valve regurgitation is mild. Aortic regurgitation PHT measures 472 msec. Mild aortic valve sclerosis is present, with no evidence of aortic valve stenosis. Pulmonic Valve: The pulmonic valve was grossly normal. Pulmonic valve regurgitation is not visualized. Aorta: The aortic root and ascending aorta are structurally normal, with no evidence of dilitation. Venous: The inferior vena cava is normal in size with greater than 50% respiratory variability, suggesting right atrial pressure of 3 mmHg. IAS/Shunts: No atrial level shunt detected by color flow Doppler. Additional Comments: A device lead is visualized.  LEFT VENTRICLE PLAX 2D LVIDd:         7.10 cm      Diastology LVIDs:         6.50 cm      LV e' medial:    4.13 cm/s LV PW:         1.00 cm      LV E/e' medial:  22.1 LV IVS:        1.00 cm      LV e' lateral:   4.24 cm/s LVOT diam:     2.50 cm      LV E/e' lateral: 21.5 LV SV:         78 LV SV Index:   37 LVOT Area:     4.91 cm  LV Volumes (MOD) LV vol d, MOD A4C: 334.0 ml LV vol s, MOD A4C: 235.0 ml LV SV MOD A4C:     334.0 ml RIGHT VENTRICLE RV Basal diam:  4.30 cm RV S prime:     8.16 cm/s TAPSE (M-mode): 1.7 cm LEFT ATRIUM              Index       RIGHT ATRIUM           Index LA diam:        5.30 cm  2.51 cm/m  RA Area:     21.40 cm LA Vol (A2C):   104.0 ml 49.16  ml/m RA Volume:   65.90 ml  31.15 ml/m LA Vol (A4C):   85.1 ml  40.23 ml/m LA Biplane Vol: 97.0 ml  45.85 ml/m  AORTIC VALVE LVOT Vmax:   80.90 cm/s LVOT Vmean:  51.700 cm/s LVOT VTI:    0.158 m AI PHT:      472 msec  AORTA Ao Root diam: 3.30 cm MITRAL VALVE               TRICUSPID VALVE MV Area (PHT): 5.31 cm    TR Peak grad:   36.7 mmHg MV Decel Time: 143 msec    TR Vmax:        303.00 cm/s MV E velocity: 91.30 cm/s MV A velocity: 33.00 cm/s  SHUNTS MV E/A ratio:  2.77        Systemic VTI:  0.16 m                            Systemic Diam: 2.50 cm Zoila Shutter MD Electronically signed by Zoila Shutter MD Signature Date/Time: 01/13/2021/3:14:28 PM    Final       Subjective:  Reports he is feeling better today, reports generalized weakness, and dyspnea has significantly improved.  Discharge Exam: Vitals:   01/14/21 0505  01/14/21 0755  BP: 104/84 113/87  Pulse: 74   Resp: 17 16  Temp: 98 F (36.7 C) 98 F (36.7 C)  SpO2: 95%    Vitals:   01/13/21 1553 01/13/21 2037 01/14/21 0505 01/14/21 0755  BP: 101/79 111/84 104/84 113/87  Pulse: 62 72 74   Resp: 18 19 17 16   Temp: 97.8 F (36.6 C) 97.8 F (36.6 C) 98 F (36.7 C) 98 F (36.7 C)  TempSrc: Oral Oral Oral Oral  SpO2: 93% 95% 95%   Weight:   96.6 kg   Height:        General: Pt is alert, awake, not in acute distress Cardiovascular: RRR, S1/S2 +, no rubs, no gallops Respiratory: CTA bilaterally, no wheezing, no rhonchi Abdominal: Soft, NT, ND, bowel sounds + Extremities: no edema, no cyanosis    The results of significant diagnostics from this hospitalization (including imaging, microbiology, ancillary and laboratory) are listed below for reference.     Microbiology: Recent Results (from the past 240 hour(s))  Resp Panel by RT-PCR (Flu A&B, Covid) Nasopharyngeal Swab     Status: None   Collection Time: 01/12/21 11:04 AM   Specimen: Nasopharyngeal Swab; Nasopharyngeal(NP) swabs in vial transport medium  Result Value  Ref Range Status   SARS Coronavirus 2 by RT PCR NEGATIVE NEGATIVE Final    Comment: (NOTE) SARS-CoV-2 target nucleic acids are NOT DETECTED.  The SARS-CoV-2 RNA is generally detectable in upper respiratory specimens during the acute phase of infection. The lowest concentration of SARS-CoV-2 viral copies this assay can detect is 138 copies/mL. A negative result does not preclude SARS-Cov-2 infection and should not be used as the sole basis for treatment or other patient management decisions. A negative result may occur with  improper specimen collection/handling, submission of specimen other than nasopharyngeal swab, presence of viral mutation(s) within the areas targeted by this assay, and inadequate number of viral copies(<138 copies/mL). A negative result must be combined with clinical observations, patient history, and epidemiological information. The expected result is Negative.  Fact Sheet for Patients:  01/14/21  Fact Sheet for Healthcare Providers:  BloggerCourse.com  This test is no t yet approved or cleared by the [place] FDA and  has been authorized for detection and/or diagnosis of SARS-CoV-2 by FDA under an Emergency Use Authorization (EUA). This EUA will remain  in effect (meaning this test can be used) for the duration of the COVID-19 declaration under Section 564(b)(1) of the Act, 21 U.S.C.section 360bbb-3(b)(1), unless the authorization is terminated  or revoked sooner.       Influenza A by PCR NEGATIVE NEGATIVE Final   Influenza B by PCR NEGATIVE NEGATIVE Final    Comment: (NOTE) The Xpert Xpress SARS-CoV-2/FLU/RSV plus assay is intended as an aid in the diagnosis of influenza from Nasopharyngeal swab specimens and should not be used as a sole basis for treatment. Nasal washings and aspirates are unacceptable for Xpert Xpress SARS-CoV-2/FLU/RSV testing.  Fact Sheet for  Patients: [place]  Fact Sheet for Healthcare Providers: BloggerCourse.com  This test is not yet approved or cleared by the [place] FDA and has been authorized for detection and/or diagnosis of SARS-CoV-2 by FDA under an Emergency Use Authorization (EUA). This EUA will remain in effect (meaning this test can be used) for the duration of the COVID-19 declaration under Section 564(b)(1) of the Act, 21 U.S.C. section 360bbb-3(b)(1), unless the authorization is terminated or revoked.  Performed at [facility], [address] [place], [place]  Labs: BNP (last 3 results) Recent Labs    05/25/20 0224 01/12/21 0958  BNP 834.5* 1,707.8*   Basic Metabolic Panel: Recent Labs  Lab 01/12/21 0958 01/12/21 1513 01/14/21 0813  NA 139  --  138  K 4.0  --  4.1  CL 102  --  97*  CO2 27  --  34*  GLUCOSE 114*  --  95  BUN 20  --  19  CREATININE 1.62* 1.57* 1.35*  CALCIUM 9.6  --  9.7  MG 2.1  --   --    Liver Function Tests: No results for input(s): AST, ALT, ALKPHOS, BILITOT, PROT, ALBUMIN in the last 168 hours. No results for input(s): LIPASE, AMYLASE in the last 168 hours. No results for input(s): AMMONIA in the last 168 hours. CBC: Recent Labs  Lab 01/12/21 0958 01/12/21 1513  WBC 7.2 7.2  HGB 15.7 15.4  HCT 49.3 47.8  MCV 83.6 83.4  PLT 167 153   Cardiac Enzymes: No results for input(s): CKTOTAL, CKMB, CKMBINDEX, TROPONINI in the last 168 hours. BNP: Invalid input(s): POCBNP CBG: No results for input(s): GLUCAP in the last 168 hours. D-Dimer No results for input(s): DDIMER in the last 72 hours. Hgb A1c No results for input(s): HGBA1C in the last 72 hours. Lipid Profile No results for input(s): CHOL, HDL, LDLCALC, TRIG, CHOLHDL, LDLDIRECT in the last 72 hours. Thyroid function studies No results for input(s): TSH, T4TOTAL, T3FREE, THYROIDAB in the last 72  hours.  Invalid input(s): FREET3 Anemia work up No results for input(s): VITAMINB12, FOLATE, FERRITIN, TIBC, IRON, RETICCTPCT in the last 72 hours. Urinalysis    Component Value Date/Time   COLORURINE YELLOW 04/19/2019 1141   APPEARANCEUR HAZY (A) 04/19/2019 1141   LABSPEC 1.016 04/19/2019 1141   PHURINE 5.0 04/19/2019 1141   GLUCOSEU NEGATIVE 04/19/2019 1141   HGBUR NEGATIVE 04/19/2019 1141   BILIRUBINUR NEGATIVE 04/19/2019 1141   KETONESUR NEGATIVE 04/19/2019 1141   PROTEINUR 30 (A) 04/19/2019 1141   NITRITE NEGATIVE 04/19/2019 1141   LEUKOCYTESUR SMALL (A) 04/19/2019 1141   Sepsis Labs Invalid input(s): PROCALCITONIN,  WBC,  LACTICIDVEN Microbiology Recent Results (from the past 240 hour(s))  Resp Panel by RT-PCR (Flu A&B, Covid) Nasopharyngeal Swab     Status: None   Collection Time: 01/12/21 11:04 AM   Specimen: Nasopharyngeal Swab; Nasopharyngeal(NP) swabs in vial transport medium  Result Value Ref Range Status   SARS Coronavirus 2 by RT PCR NEGATIVE NEGATIVE Final    Comment: (NOTE) SARS-CoV-2 target nucleic acids are NOT DETECTED.  The SARS-CoV-2 RNA is generally detectable in upper respiratory specimens during the acute phase of infection. The lowest concentration of SARS-CoV-2 viral copies this assay can detect is 138 copies/mL. A negative result does not preclude SARS-Cov-2 infection and should not be used as the sole basis for treatment or other patient management decisions. A negative result may occur with  improper specimen collection/handling, submission of specimen other than nasopharyngeal swab, presence of viral mutation(s) within the areas targeted by this assay, and inadequate number of viral copies(<138 copies/mL). A negative result must be combined with clinical observations, patient history, and epidemiological information. The expected result is Negative.  Fact Sheet for Patients:  BloggerCourse.com  Fact Sheet for  Healthcare Providers:  [employer]it  This test is no t yet approved or cleared by the [place] FDA and  has been authorized for detection and/or diagnosis of SARS-CoV-2 by FDA under an Emergency Use Authorization (EUA). This EUA will remain  in  effect (meaning this test can be used) for the duration of the COVID-19 declaration under Section 564(b)(1) of the Act, 21 U.S.C.section 360bbb-3(b)(1), unless the authorization is terminated  or revoked sooner.       Influenza A by PCR NEGATIVE NEGATIVE Final   Influenza B by PCR NEGATIVE NEGATIVE Final    Comment: (NOTE) The Xpert Xpress SARS-CoV-2/FLU/RSV plus assay is intended as an aid in the diagnosis of influenza from Nasopharyngeal swab specimens and should not be used as a sole basis for treatment. Nasal washings and aspirates are unacceptable for Xpert Xpress SARS-CoV-2/FLU/RSV testing.  Fact Sheet for Patients: BloggerCourse.com  Fact Sheet for Healthcare Providers: [employer]ious[employer]ker.it  This test is not yet approved or cleared by the [place] FDA and has been authorized for detection and/or diagnosis of SARS-CoV-2 by FDA under an Emergency Use Authorization (EUA). This EUA will remain in effect (meaning this test can be used) for the duration of the COVID-19 declaration under Section 564(b)(1) of the Act, 21 U.S.C. section 360bbb-3(b)(1), unless the authorization is terminated or revoked.  Performed at [facility], [address] [place], [place] [address]      Time coordinating discharge: Over 30 minutes  SIGNED:   Huey Bienenstock, MD  [facility] 01/14/2021, 11:31 AM Pager   If 7PM-7AM, please contact night-coverage [email or web address] Password [email or web address]

## 2021-01-14 NOTE — TOC Transition Note (Signed)
Transition of Care [facility]) - CM/SW Discharge Note   Patient Details  Name: Evan Serrano MRN: [id number] Date of Birth: 12-01-1950  [facility]) CM/SW Contact:  Leone Haven, RN Phone Number: 01/14/2021, 3:43 PM   Clinical Narrative:    NCM spoke with patient wife in the room.  NCM asked which local pharmacy to send meds to she states to send to [facility].  NCM informed MD to send meds there.  NCM faxed  dc summary to patient's PCP Dr. Raphael Gibney [phone].    Final next level of care: Home/Self Care Barriers to Discharge: No Barriers Identified   Patient Goals and CMS Choice        Discharge Placement                       Discharge Plan and Services                                     Social Determinants of Health (SDOH) Interventions     Readmission Risk Interventions No flowsheet data found.

## 2021-01-14 NOTE — Discharge Instructions (Signed)

## 2021-01-14 NOTE — Progress Notes (Signed)
Initial Nutrition Assessment  DOCUMENTATION CODES:  Non-severe (moderate) malnutrition in context of chronic illness  INTERVENTION:  Add Ensure Enlive po BID, each supplement provides 350 kcal and 20 grams of protein.  Add MVI with minerals daily.  Continue 2 gram sodium diet.  NUTRITION DIAGNOSIS:  Moderate Malnutrition related to chronic illness (CHF) as evidenced by percent weight loss, mild fat depletion, mild muscle depletion.  GOAL:  Patient will meet greater than or equal to 90% of their needs  MONITOR:  PO intake, Supplement acceptance, Labs, Weight trends, I & O's  REASON FOR ASSESSMENT:  Malnutrition Screening Tool    ASSESSMENT:  70 yo male with a PMH of CHF, cardiac defibrillator, CKD stage IIIa, was transferred by [facility] due to evaluation of sob.  Patient recently visited [place] and had some sausage beef, then last night had canned salmon patti which he described had high salt content, then today noticed more sob from his baseline. He can only walk few steps before becoming sob.  He also reports since his cardiac medications were adjusted he notices more dizziness and weakness.He reports sometimes when he moves his head around gets dizziness.  Spoke with pt at bedside. Pt reports eating mostly salty and smoked foods, which caused fluid to build up. He also reports that he would not eat very well for days at a time either when he did not feel well.  Per Epic, pt has lost ~38 lbs (14.8%) in the last 8 months, which is significant and severe for the time frame. It is possible that some of this weight is fluid, and it will be difficult to discern if this is true weight loss.  Patient requested education information. RD attached to discharge summary for patient.  On exam, pt with few mild depletions, but fluid may be masking some.  Recommend adding Ensure Enlive BID and MVI with minerals daily if patient does not discharge.  Medications: reviewed; Vitamin D  Labs:  reviewed  NUTRITION - FOCUSED PHYSICAL EXAM: Flowsheet Row Most Recent Value  Orbital Region Mild depletion  Upper Arm Region No depletion  Thoracic and Lumbar Region No depletion  Buccal Region Mild depletion  Temple Region Mild depletion  Clavicle Bone Region No depletion  Clavicle and Acromion Bone Region No depletion  Scapular Bone Region No depletion  Dorsal Hand Mild depletion  Patellar Region Mild depletion  Anterior Thigh Region No depletion  Posterior Calf Region Mild depletion  Edema (RD Assessment) None  Hair Reviewed  Eyes Reviewed  Mouth Reviewed  Skin Reviewed  Nails Reviewed   Diet Order:   Diet Order             Diet - low sodium heart healthy           Diet 2 gram sodium Room service appropriate? Yes; Fluid consistency: Thin  Diet effective now                  EDUCATION NEEDS:  Education needs have been addressed  Skin:  Skin Assessment: Reviewed RN Assessment  Last BM:  01/13/21  Height:  Ht Readings from Last 1 Encounters:  01/12/21 5\' 8"  (1.727 m)   Weight:  Wt Readings from Last 1 Encounters:  01/14/21 96.6 kg   BMI:  Body mass index is 32.39 kg/m.  Estimated Nutritional Needs:  Kcal:  1950-2150 Protein:  105-120 grams Fluid:  >1.95 L  01/16/21, RD, LDN (she/her/hers) Registered Dietitian I After-Hours/Weekend Pager # in [place]

## 2021-05-24 ENCOUNTER — Other Ambulatory Visit: Payer: Self-pay

## 2021-05-24 ENCOUNTER — Emergency Department (HOSPITAL_BASED_OUTPATIENT_CLINIC_OR_DEPARTMENT_OTHER)
Admission: EM | Admit: 2021-05-24 | Discharge: 2021-05-24 | Disposition: A | Discharge status: Home / Self Care | Payer: Medicare Other | Attending: Emergency Medicine | Admitting: Emergency Medicine

## 2021-05-24 ENCOUNTER — Emergency Department (HOSPITAL_BASED_OUTPATIENT_CLINIC_OR_DEPARTMENT_OTHER): Payer: Medicare Other | Admitting: Radiology

## 2021-05-24 ENCOUNTER — Encounter (HOSPITAL_BASED_OUTPATIENT_CLINIC_OR_DEPARTMENT_OTHER): Payer: Self-pay | Admitting: *Deleted

## 2021-05-24 DIAGNOSIS — Z20822 Contact with and (suspected) exposure to covid-19: Secondary | ICD-10-CM | POA: Diagnosis not present

## 2021-05-24 DIAGNOSIS — I110 Hypertensive heart disease with heart failure: Secondary | ICD-10-CM | POA: Diagnosis not present

## 2021-05-24 DIAGNOSIS — Z7982 Long term (current) use of aspirin: Secondary | ICD-10-CM | POA: Insufficient documentation

## 2021-05-24 DIAGNOSIS — R0602 Shortness of breath: Secondary | ICD-10-CM | POA: Insufficient documentation

## 2021-05-24 DIAGNOSIS — Z79899 Other long term (current) drug therapy: Secondary | ICD-10-CM | POA: Insufficient documentation

## 2021-05-24 DIAGNOSIS — I5021 Acute systolic (congestive) heart failure: Secondary | ICD-10-CM | POA: Insufficient documentation

## 2021-05-24 DIAGNOSIS — I509 Heart failure, unspecified: Secondary | ICD-10-CM

## 2021-05-24 HISTORY — DX: COVID-19: U07.1

## 2021-05-24 LAB — CBC
HCT: 47.2 % (ref 39.0–52.0)
Hemoglobin: 15.2 g/dL (ref 13.0–17.0)
MCH: 27 pg (ref 26.0–34.0)
MCHC: 32.2 g/dL (ref 30.0–36.0)
MCV: 83.8 fL (ref 80.0–100.0)
Platelets: 188 10*3/uL (ref 150–400)
RBC: 5.63 MIL/uL (ref 4.22–5.81)
RDW: 17.2 % — ABNORMAL HIGH (ref 11.5–15.5)
WBC: 7.9 10*3/uL (ref 4.0–10.5)
nRBC: 0 % (ref 0.0–0.2)

## 2021-05-24 LAB — BASIC METABOLIC PANEL
Anion gap: 8 (ref 5–15)
BUN: 19 mg/dL (ref 8–23)
CO2: 25 mmol/L (ref 22–32)
Calcium: 9.3 mg/dL (ref 8.9–10.3)
Chloride: 105 mmol/L (ref 98–111)
Creatinine, Ser: 1.38 mg/dL — ABNORMAL HIGH (ref 0.61–1.24)
GFR, Estimated: 55 mL/min — ABNORMAL LOW (ref >60)
Glucose, Bld: 88 mg/dL (ref 70–99)
Potassium: 4 mmol/L (ref 3.5–5.1)
Sodium: 138 mmol/L (ref 135–145)

## 2021-05-24 LAB — RESP PANEL BY RT-PCR (FLU A&B, COVID) ARPGX2
Influenza A by PCR: NEGATIVE
Influenza B by PCR: NEGATIVE
SARS Coronavirus 2 by RT PCR: NEGATIVE

## 2021-05-24 LAB — BRAIN NATRIURETIC PEPTIDE: B Natriuretic Peptide: 1497.6 pg/mL — ABNORMAL HIGH (ref 0.0–100.0)

## 2021-05-24 LAB — TROPONIN I (HIGH SENSITIVITY)
Troponin I (High Sensitivity): 22 ng/L — ABNORMAL HIGH (ref <18)
Troponin I (High Sensitivity): 21 ng/L — ABNORMAL HIGH (ref <18)

## 2021-05-24 LAB — D-DIMER, QUANTITATIVE: D-Dimer, Quant: 0.28 ug/mL-FEU (ref 0.00–0.50)

## 2021-05-24 LAB — MAGNESIUM: Magnesium: 2.1 mg/dL (ref 1.7–2.4)

## 2021-05-24 MED ORDER — SODIUM CHLORIDE 0.9 % IV BOLUS
1000.0000 mL | Freq: Once | INTRAVENOUS | Status: AC
  Administered 2021-05-24: 13:00:00 1000 mL via INTRAVENOUS

## 2021-05-24 MED ORDER — TORSEMIDE 20 MG PO TABS: 10.0000 mg | ORAL_TABLET | ORAL | 2 refills | Status: DC

## 2021-05-24 MED ORDER — FUROSEMIDE 10 MG/ML IJ SOLN
80.0000 mg | Freq: Once | INTRAMUSCULAR | Status: AC
  Administered 2021-05-24: 20:00:00 80 mg via INTRAVENOUS
  Filled 2021-05-24: qty 8

## 2021-05-24 NOTE — ED Triage Notes (Addendum)
3 weeks of cold symptoms, loss of appetite, syncope several times yesterday while coughing, labored resp. Pt later states he is on home 02 and cannot tell the staff the liter amount, Sats 96% RA

## 2021-05-24 NOTE — ED Provider Notes (Signed)
[facility] EMERGENCY DEPT Provider Note   CSN: [id number] Arrival date & time: 05/24/21  0907     History Chief Complaint  Patient presents with   Shortness of Breath   Loss of Consciousness   Nasal Congestion   No appetite    Evan Serrano is a 70 y.o. male.  HPI  70 year old male with past medical history of CHF, AKI, orthostatic hypotension with history of syncope, defibrillator in place presents to the emergency department with concern for multiple complaints.  For the past couple weeks has had generalized illness, cold symptoms, loss of appetite.  States has had intermittent productive cough.  More concerning over the last day he states that when he coughs severely he starts to blackout has had episodes of syncope secondary to coughing and shortness of breath.  Patient denies any swelling of his lower extremities.  He states his defibrillator has not fired.  He has had intermittent palpitations but denies any chest pain/back pain.  No noted fever.  No recent flu/COVID contact.  Past Medical History:  Diagnosis Date   Cardiac pacemaker    COVID    Hypertension    Presence of cardiac defibrillator     Patient Active Problem List   Diagnosis Date Noted   Acute systolic congestive heart failure ([place]) 01/14/2021   Malnutrition of moderate degree 01/14/2021   CHF (congestive heart failure) ([place]) 01/12/2021   Orthostatic hypotension 04/20/2019   COVID-19 virus infection    Renal insufficiency    AKI (acute kidney injury) ([place]) 04/19/2019   Chronic HFrEF (heart failure with reduced ejection fraction) ([place]) 04/19/2019   Syncope due to orthostatic hypotension 04/19/2019    Past Surgical History:  Procedure Laterality Date   PACEMAKER IMPLANT         History reviewed. No pertinent family history.  Social History   Tobacco Use   Smoking status: Never   Smokeless tobacco: Never  Vaping Use   Vaping Use: Never used  Substance Use Topics   Alcohol  use: Never   Drug use: Never    Home Medications Prior to Admission medications   Medication Sig Start Date End Date Taking? Authorizing Provider  aspirin 81 MG EC tablet Take 81 mg by mouth daily.    [provider]  carvedilol (COREG) 12.5 MG tablet Take 6.25 mg by mouth 2 (two) times daily with a meal.    [provider]  feeding supplement (ENSURE ENLIVE / ENSURE PLUS) LIQD Take 237 mLs by mouth 2 (two) times daily between meals. 01/14/21   Elgergawy, Silver Huguenin, MD  losartan (COZAAR) 25 MG tablet Take 1 tablet (25 mg total) by mouth daily. 01/14/21 03/15/21  Elgergawy, Silver Huguenin, MD  Multiple Vitamin (MULTIVITAMIN WITH MINERALS) TABS tablet Take 1 tablet by mouth daily. 01/14/21   Elgergawy, Silver Huguenin, MD  simvastatin (ZOCOR) 10 MG tablet Take 10 mg by mouth at bedtime.    [provider]  spironolactone (ALDACTONE) 25 MG tablet Take 0.5 tablets (12.5 mg total) by mouth daily. 04/27/19   Thurnell Lose, MD  torsemide (DEMADEX) 20 MG tablet Take 0.5-1 tablets (10-20 mg total) by mouth See admin instructions. Take 10mg  by mouth daily.  If weight gain of 5lbs or more, then take 20mg  daily for two days.  If weight does not decrease, then see MD. 04/27/19   Thurnell Lose, MD  Vitamin D, Cholecalciferol, 10 MCG (400 UNIT) CAPS Take 800 Units by mouth daily.    [provider]  Allergies    Patient has no known allergies.  Review of Systems   Review of Systems  Constitutional:  Positive for appetite change and fatigue. Negative for chills and fever.  HENT:  Positive for congestion.   Eyes:  Negative for visual disturbance.  Respiratory:  Positive for cough and shortness of breath. Negative for chest tightness.   Cardiovascular:  Positive for palpitations. Negative for chest pain and leg swelling.  Gastrointestinal:  Negative for abdominal pain, diarrhea and vomiting.  Genitourinary:  Negative for dysuria.  Skin:  Negative for rash.  Neurological:   Negative for headaches.   Physical Exam Updated Vital Signs BP 110/85    Pulse 78    Temp 98.1 F (36.7 C)    Resp (!) 26    Ht 5\' 9"  (1.753 m)    Wt 100.2 kg    SpO2 97%    BMI 32.64 kg/m   Physical Exam Vitals and nursing note reviewed.  Constitutional:      General: He is not in acute distress.    Appearance: Normal appearance. He is not diaphoretic.  HENT:     Head: Normocephalic.     Mouth/Throat:     Mouth: Mucous membranes are moist.  Cardiovascular:     Rate and Rhythm: Normal rate and regular rhythm.  Pulmonary:     Effort: Pulmonary effort is normal. No respiratory distress.     Breath sounds: No wheezing or rales.  Chest:     Chest wall: No tenderness.  Abdominal:     Palpations: Abdomen is soft.     Tenderness: There is no abdominal tenderness.  Musculoskeletal:     Right lower leg: No edema.     Left lower leg: No edema.  Skin:    General: Skin is warm.  Neurological:     Mental Status: He is alert and oriented to person, place, and time. Mental status is at baseline.  Psychiatric:        Mood and Affect: Mood normal.    ED Results / Procedures / Treatments   Labs (all labs ordered are listed, but only abnormal results are displayed) Labs Reviewed  BASIC METABOLIC PANEL - Abnormal; Notable for the following components:      Result Value   Creatinine, Ser 1.38 (*)    GFR, Estimated 55 (*)    All other components within normal limits  CBC - Abnormal; Notable for the following components:   RDW 17.2 (*)    All other components within normal limits  TROPONIN I (HIGH SENSITIVITY) - Abnormal; Notable for the following components:   Troponin I (High Sensitivity) 22 (*)    All other components within normal limits  RESP PANEL BY RT-PCR (FLU A&B, COVID) ARPGX2  MAGNESIUM  D-DIMER, QUANTITATIVE  URINALYSIS, ROUTINE W REFLEX MICROSCOPIC  TROPONIN I (HIGH SENSITIVITY)    EKG EKG Interpretation  Date/Time:  Friday May 24 2021 09:19:11  EST Ventricular Rate:  70 PR Interval:  228 QRS Duration: 124 QT Interval:  442 QTC Calculation: 477 R Axis:   -58 Text Interpretation: Sinus rhythm with 1st degree A-V block with frequent and consecutive Premature ventricular complexes Left axis deviation Inferior infarct , age undetermined Anterior infarct , age undetermined Abnormal ECG Confirmed by Lavenia Atlas [phone]) on 05/24/2021 2:56:14 PM  Radiology DG Chest Port 1 View  Result Date: 05/24/2021 CLINICAL DATA:  Three weeks of cold symptoms, syncope EXAM: PORTABLE CHEST 1 VIEW COMPARISON:  Chest radiograph 01/12/2021 FINDINGS: The single lead  left chest wall cardiac device is stable. The heart is enlarged, unchanged. The upper mediastinal contours are within normal limits. There is vascular congestion without definite overt pulmonary edema. There is no focal consolidation. There is no pleural effusion or pneumothorax There is no acute osseous abnormality. IMPRESSION: Cardiomegaly with vascular congestion but no overt pulmonary edema. Electronically Signed   By: Lesia Hausen M.D.   On: 05/24/2021 09:56    Procedures Procedures   Medications Ordered in ED Medications  sodium chloride 0.9 % bolus 1,000 mL (1,000 mLs Intravenous New Bag/Given 05/24/21 1245)    ED Course  I have reviewed the triage vital signs and the nursing notes.  Pertinent labs & imaging results that were available during my care of the patient were reviewed by me and considered in my medical decision making (see chart for details).    MDM Rules/Calculators/A&P                          70 year old male presents to the emergency department with generalized illness, cough and associated syncope.  Hypotensive on arrival, endorsing fatigue and decreased appetite as well as decreased oral intake including fluids.  Blood work is reassuring, baseline CKD, troponin is 22 however it appears he has baseline elevated levels.  No active chest pain.  Flu and COVID is  negative.  Magnesium and electrolytes are normal.  D-dimer is negative.  Chest x-ray shows stable cardiomegaly.  After liter of fluids his blood pressure is improved and he feels much better.  Plan for orthostatics, repeat troponin, BNP and interrogation of his device to rule out any acute arrhythmia/firing.  Patient will require reevaluation and signed out to oncoming physician.      Final Clinical Impression(s) / ED Diagnoses Final diagnoses:  SOB (shortness of breath)    Rx / DC Orders ED Discharge Orders     None        Rozelle Logan, DO 05/24/21 1512

## 2021-05-24 NOTE — Discharge Instructions (Addendum)
I would recommend taking 20 mg of your Demadex daily for the next several days.  Then back to your normal amount of 1/2 tablet and then increasing it as needed to a full tablet daily.  Today's work-up did show evidence of congestive heart failure.  Return for any new or worse symptoms.  Follow-up with the [facility] clinic in the next few days.

## 2021-05-24 NOTE — ED Provider Notes (Signed)
Patient's troponins here without significant change.  BNP is at 1497.  It was higher than that when he was admitted back in August but I suspect he is got some degree of pulmonary edema ongoing.  COVID testing negative.  Labs without significant abnormalities.  Potassium is normal GFR reasonable at 55.  White blood cell counts normal.  Magnesium is normal D-dimer normal.  And his troponins were stable in the low 20s.  Patient tells me he does have oxygen at home and is able to use it as needed.  He has CPAP.  Patient has not been taking his Demadex.  So I feel that there is a component of CHF here.  However patient able to walk around here in the department without any particular difficulties.  Blood pressure in the upper 90s and low 100s.  But patient without any difficulty ambulating.  Patient states he does have a few Demadex at home normally gets his prescriptions filled at [facility].  He prefer to go home.  If at all possible.  I think we will go ahead and give patient some IV Lasix here see how he responds and if he does okay and pressures do not drop by then I think he can go home and I will give him a new prescription for his Demadex.  Normally he is taking 1 tablet 20 mg daily.  At times he takes 1/2 tablet daily.  He does feel that his weight has been gaining as well.  Would recommend that he take 20 mg daily.  And then follow-up with the [facility] if he is able to go home.   Vanetta Mulders, MD 05/24/21 2007

## 2021-05-24 NOTE — ED Notes (Signed)
Pt stated that he is unable to lie flat for orthostatic vitals.

## 2021-05-24 NOTE — ED Notes (Signed)
RT Note: Pt. uses CPAP/Intermittent Oxygen at home.

## 2021-06-12 ENCOUNTER — Emergency Department (HOSPITAL_BASED_OUTPATIENT_CLINIC_OR_DEPARTMENT_OTHER): Payer: No Typology Code available for payment source

## 2021-06-12 ENCOUNTER — Other Ambulatory Visit: Payer: Self-pay

## 2021-06-12 ENCOUNTER — Encounter (HOSPITAL_BASED_OUTPATIENT_CLINIC_OR_DEPARTMENT_OTHER): Payer: Self-pay | Admitting: Emergency Medicine

## 2021-06-12 ENCOUNTER — Inpatient Hospital Stay (HOSPITAL_BASED_OUTPATIENT_CLINIC_OR_DEPARTMENT_OTHER)
Admission: EM | Admit: 2021-06-12 | Discharge: 2021-06-17 | DRG: 286 | Disposition: A | Discharge status: Home Health | Payer: No Typology Code available for payment source | Attending: Internal Medicine | Admitting: Internal Medicine

## 2021-06-12 DIAGNOSIS — E669 Obesity, unspecified: Secondary | ICD-10-CM | POA: Diagnosis present

## 2021-06-12 DIAGNOSIS — I4729 Other ventricular tachycardia: Secondary | ICD-10-CM | POA: Diagnosis not present

## 2021-06-12 DIAGNOSIS — Z7982 Long term (current) use of aspirin: Secondary | ICD-10-CM | POA: Diagnosis not present

## 2021-06-12 DIAGNOSIS — J9621 Acute and chronic respiratory failure with hypoxia: Secondary | ICD-10-CM | POA: Diagnosis present

## 2021-06-12 DIAGNOSIS — E785 Hyperlipidemia, unspecified: Secondary | ICD-10-CM | POA: Diagnosis present

## 2021-06-12 DIAGNOSIS — Z9981 Dependence on supplemental oxygen: Secondary | ICD-10-CM | POA: Diagnosis not present

## 2021-06-12 DIAGNOSIS — Z95810 Presence of automatic (implantable) cardiac defibrillator: Secondary | ICD-10-CM | POA: Diagnosis not present

## 2021-06-12 DIAGNOSIS — Z6833 Body mass index (BMI) 33.0-33.9, adult: Secondary | ICD-10-CM

## 2021-06-12 DIAGNOSIS — N1832 Chronic kidney disease, stage 3b: Secondary | ICD-10-CM | POA: Diagnosis present

## 2021-06-12 DIAGNOSIS — Z8616 Personal history of COVID-19: Secondary | ICD-10-CM | POA: Diagnosis not present

## 2021-06-12 DIAGNOSIS — N289 Disorder of kidney and ureter, unspecified: Secondary | ICD-10-CM | POA: Diagnosis not present

## 2021-06-12 DIAGNOSIS — Z20822 Contact with and (suspected) exposure to covid-19: Secondary | ICD-10-CM | POA: Diagnosis present

## 2021-06-12 DIAGNOSIS — I5021 Acute systolic (congestive) heart failure: Secondary | ICD-10-CM | POA: Diagnosis not present

## 2021-06-12 DIAGNOSIS — I2510 Atherosclerotic heart disease of native coronary artery without angina pectoris: Secondary | ICD-10-CM | POA: Diagnosis present

## 2021-06-12 DIAGNOSIS — N1831 Chronic kidney disease, stage 3a: Secondary | ICD-10-CM | POA: Diagnosis not present

## 2021-06-12 DIAGNOSIS — Z23 Encounter for immunization: Secondary | ICD-10-CM | POA: Diagnosis not present

## 2021-06-12 DIAGNOSIS — I5043 Acute on chronic combined systolic (congestive) and diastolic (congestive) heart failure: Secondary | ICD-10-CM | POA: Diagnosis present

## 2021-06-12 DIAGNOSIS — I472 Ventricular tachycardia, unspecified: Secondary | ICD-10-CM | POA: Diagnosis present

## 2021-06-12 DIAGNOSIS — I428 Other cardiomyopathies: Secondary | ICD-10-CM | POA: Diagnosis present

## 2021-06-12 DIAGNOSIS — I5023 Acute on chronic systolic (congestive) heart failure: Secondary | ICD-10-CM | POA: Diagnosis not present

## 2021-06-12 DIAGNOSIS — I272 Pulmonary hypertension, unspecified: Secondary | ICD-10-CM | POA: Diagnosis present

## 2021-06-12 DIAGNOSIS — I480 Paroxysmal atrial fibrillation: Secondary | ICD-10-CM | POA: Diagnosis present

## 2021-06-12 DIAGNOSIS — Z8249 Family history of ischemic heart disease and other diseases of the circulatory system: Secondary | ICD-10-CM | POA: Diagnosis not present

## 2021-06-12 DIAGNOSIS — R0602 Shortness of breath: Secondary | ICD-10-CM | POA: Diagnosis present

## 2021-06-12 DIAGNOSIS — E876 Hypokalemia: Secondary | ICD-10-CM | POA: Diagnosis not present

## 2021-06-12 DIAGNOSIS — Z79899 Other long term (current) drug therapy: Secondary | ICD-10-CM | POA: Diagnosis not present

## 2021-06-12 DIAGNOSIS — G4733 Obstructive sleep apnea (adult) (pediatric): Secondary | ICD-10-CM | POA: Diagnosis present

## 2021-06-12 DIAGNOSIS — I130 Hypertensive heart and chronic kidney disease with heart failure and stage 1 through stage 4 chronic kidney disease, or unspecified chronic kidney disease: Principal | ICD-10-CM | POA: Diagnosis present

## 2021-06-12 DIAGNOSIS — Z7901 Long term (current) use of anticoagulants: Secondary | ICD-10-CM

## 2021-06-12 DIAGNOSIS — I951 Orthostatic hypotension: Secondary | ICD-10-CM | POA: Diagnosis present

## 2021-06-12 DIAGNOSIS — I509 Heart failure, unspecified: Secondary | ICD-10-CM

## 2021-06-12 DIAGNOSIS — F32A Depression, unspecified: Secondary | ICD-10-CM | POA: Insufficient documentation

## 2021-06-12 LAB — HEPATIC FUNCTION PANEL
ALT: 25 U/L (ref 0–44)
AST: 27 U/L (ref 15–41)
Albumin: 3.7 g/dL (ref 3.5–5.0)
Alkaline Phosphatase: 85 U/L (ref 38–126)
Bilirubin, Direct: 0.6 mg/dL — ABNORMAL HIGH (ref 0.0–0.2)
Indirect Bilirubin: 2.4 mg/dL — ABNORMAL HIGH (ref 0.3–0.9)
Total Bilirubin: 3 mg/dL — ABNORMAL HIGH (ref 0.3–1.2)
Total Protein: 7.3 g/dL (ref 6.5–8.1)

## 2021-06-12 LAB — URINALYSIS, ROUTINE W REFLEX MICROSCOPIC
Bilirubin Urine: NEGATIVE
Glucose, UA: NEGATIVE mg/dL
Hgb urine dipstick: NEGATIVE
Ketones, ur: NEGATIVE mg/dL
Nitrite: NEGATIVE
Protein, ur: 30 mg/dL — AB
Specific Gravity, Urine: 1.023 (ref 1.005–1.030)
pH: 5.5 (ref 5.0–8.0)

## 2021-06-12 LAB — CBC
HCT: 48.9 % (ref 39.0–52.0)
Hemoglobin: 15.8 g/dL (ref 13.0–17.0)
MCH: 26.8 pg (ref 26.0–34.0)
MCHC: 32.3 g/dL (ref 30.0–36.0)
MCV: 82.9 fL (ref 80.0–100.0)
Platelets: 130 10*3/uL — ABNORMAL LOW (ref 150–400)
RBC: 5.9 MIL/uL — ABNORMAL HIGH (ref 4.22–5.81)
RDW: 16.8 % — ABNORMAL HIGH (ref 11.5–15.5)
WBC: 5.8 10*3/uL (ref 4.0–10.5)
nRBC: 0 % (ref 0.0–0.2)

## 2021-06-12 LAB — COMPREHENSIVE METABOLIC PANEL
ALT: 21 U/L (ref 0–44)
AST: 23 U/L (ref 15–41)
Albumin: 3.8 g/dL (ref 3.5–5.0)
Alkaline Phosphatase: 83 U/L (ref 38–126)
Anion gap: 9 (ref 5–15)
BUN: 17 mg/dL (ref 8–23)
CO2: 29 mmol/L (ref 22–32)
Calcium: 9.4 mg/dL (ref 8.9–10.3)
Chloride: 101 mmol/L (ref 98–111)
Creatinine, Ser: 1.46 mg/dL — ABNORMAL HIGH (ref 0.61–1.24)
GFR, Estimated: 51 mL/min — ABNORMAL LOW (ref >60)
Glucose, Bld: 92 mg/dL (ref 70–99)
Potassium: 3.6 mmol/L (ref 3.5–5.1)
Sodium: 139 mmol/L (ref 135–145)
Total Bilirubin: 2.8 mg/dL — ABNORMAL HIGH (ref 0.3–1.2)
Total Protein: 6.9 g/dL (ref 6.5–8.1)

## 2021-06-12 LAB — BRAIN NATRIURETIC PEPTIDE: B Natriuretic Peptide: 1445.1 pg/mL — ABNORMAL HIGH (ref 0.0–100.0)

## 2021-06-12 LAB — TROPONIN I (HIGH SENSITIVITY)
Troponin I (High Sensitivity): 22 ng/L — ABNORMAL HIGH (ref <18)
Troponin I (High Sensitivity): 24 ng/L — ABNORMAL HIGH (ref <18)
Troponin I (High Sensitivity): 21 ng/L — ABNORMAL HIGH (ref <18)

## 2021-06-12 LAB — MAGNESIUM: Magnesium: 2.3 mg/dL (ref 1.7–2.4)

## 2021-06-12 LAB — RESP PANEL BY RT-PCR (FLU A&B, COVID) ARPGX2
Influenza A by PCR: NEGATIVE
Influenza B by PCR: NEGATIVE
SARS Coronavirus 2 by RT PCR: NEGATIVE

## 2021-06-12 LAB — CBG MONITORING, ED: Glucose-Capillary: 99 mg/dL (ref 70–99)

## 2021-06-12 LAB — D-DIMER, QUANTITATIVE: D-Dimer, Quant: <0.27 ug/mL-FEU (ref 0.00–0.50)

## 2021-06-12 MED ORDER — APIXABAN 5 MG PO TABS
5.0000 mg | ORAL_TABLET | Freq: Two times a day (BID) | ORAL | Status: DC
  Administered 2021-06-13: 5 mg via ORAL
  Filled 2021-06-12: qty 1

## 2021-06-12 MED ORDER — ASPIRIN EC 81 MG PO TBEC
81.0000 mg | DELAYED_RELEASE_TABLET | Freq: Every day | ORAL | Status: DC
  Administered 2021-06-13: 81 mg via ORAL
  Filled 2021-06-12: qty 1

## 2021-06-12 MED ORDER — ACETAMINOPHEN 325 MG PO TABS
650.0000 mg | ORAL_TABLET | Freq: Four times a day (QID) | ORAL | Status: DC | PRN
  Administered 2021-06-15 (×2): 650 mg via ORAL
  Filled 2021-06-12 (×3): qty 2

## 2021-06-12 MED ORDER — APIXABAN 5 MG PO TABS: 5.0000 mg | ORAL_TABLET | Freq: Two times a day (BID) | ORAL | Status: DC

## 2021-06-12 MED ORDER — FUROSEMIDE 10 MG/ML IJ SOLN
20.0000 mg | Freq: Once | INTRAMUSCULAR | Status: AC
  Administered 2021-06-12: 20 mg via INTRAVENOUS
  Filled 2021-06-12: qty 2

## 2021-06-12 MED ORDER — ADULT MULTIVITAMIN W/MINERALS CH
1.0000 | ORAL_TABLET | Freq: Every day | ORAL | Status: DC
  Administered 2021-06-13 – 2021-06-17 (×5): 1 via ORAL
  Filled 2021-06-12 (×5): qty 1

## 2021-06-12 MED ORDER — SIMVASTATIN 20 MG PO TABS
10.0000 mg | ORAL_TABLET | Freq: Every day | ORAL | Status: DC
  Administered 2021-06-13 – 2021-06-14 (×2): 10 mg via ORAL
  Filled 2021-06-12 (×2): qty 1

## 2021-06-12 MED ORDER — ENSURE ENLIVE PO LIQD
237.0000 mL | Freq: Two times a day (BID) | ORAL | Status: DC
  Administered 2021-06-15 – 2021-06-17 (×4): 237 mL via ORAL

## 2021-06-12 MED ORDER — ACETAMINOPHEN 650 MG RE SUPP: 650.0000 mg | Freq: Four times a day (QID) | RECTAL | Status: DC | PRN

## 2021-06-12 MED ORDER — SODIUM CHLORIDE 0.9% FLUSH
3.0000 mL | Freq: Two times a day (BID) | INTRAVENOUS | Status: DC
  Administered 2021-06-13 – 2021-06-16 (×5): 3 mL via INTRAVENOUS

## 2021-06-12 MED ORDER — BENZONATATE 100 MG PO CAPS
200.0000 mg | ORAL_CAPSULE | Freq: Once | ORAL | Status: AC
  Administered 2021-06-12: 200 mg via ORAL
  Filled 2021-06-12: qty 2

## 2021-06-12 NOTE — ED Notes (Signed)
Report given to [facility] @ [place]

## 2021-06-12 NOTE — ED Triage Notes (Signed)
Sob and cold s/s x 2 months  gets dizzy when he coughs  saw his dr yesterday  and was told to come to  ER but he waited  till today

## 2021-06-12 NOTE — ED Provider Notes (Signed)
[facility] EMERGENCY DEPT Provider Note   CSN: [id number] Arrival date & time: 06/12/21  1009     History  Chief Complaint  Patient presents with   Shortness of Breath   Dizziness    Avary Goode is a 71 y.o. male.  HPI  71 year old male with past medical history of CAD with pacemaker in place, HTN, HLD, CHF presents emergency department as ongoing shortness of breath.  Patient states has been going on for the past 2 months.  He has a nonproductive cough and more recently when he coughs he gets dizzy and has had 2 syncopal episode secondary to this.  Denies any active chest pain or back pain.  States he had a decreased appetite.  Intermittent swelling of his lower extremities.  No history of DVT/PE.  States he is otherwise been compliant with his medications.  No recent fever or acute illness.  Was evaluated by his primary doctor and recommended to go to the [place] for admission last week but he did not.  He presents today with worsening symptoms.  Home Medications Prior to Admission medications   Medication Sig Start Date End Date Taking? Authorizing Provider  aspirin 81 MG EC tablet Take 81 mg by mouth daily.   Yes [provider]  carvedilol (COREG) 12.5 MG tablet Take 6.25 mg by mouth 2 (two) times daily with a meal.   Yes [provider]  feeding supplement (ENSURE ENLIVE / ENSURE PLUS) LIQD Take 237 mLs by mouth 2 (two) times daily between meals. 01/14/21  Yes Elgergawy, Silver Huguenin, MD  Multiple Vitamin (MULTIVITAMIN WITH MINERALS) TABS tablet Take 1 tablet by mouth daily. 01/14/21  Yes Elgergawy, Silver Huguenin, MD  simvastatin (ZOCOR) 10 MG tablet Take 10 mg by mouth at bedtime.   Yes [provider]  spironolactone (ALDACTONE) 25 MG tablet Take 0.5 tablets (12.5 mg total) by mouth daily. 04/27/19  Yes Thurnell Lose, MD  torsemide (DEMADEX) 20 MG tablet Take 0.5-1 tablets (10-20 mg total) by mouth See admin instructions. Take 10mg  by mouth  daily.  If weight gain of 5lbs or more, then take 20mg  daily for two days.  If weight does not decrease, then see MD. 05/24/21  Yes Fredia Sorrow, MD  Vitamin D, Cholecalciferol, 10 MCG (400 UNIT) CAPS Take 800 Units by mouth daily.   Yes [provider]  losartan (COZAAR) 25 MG tablet Take 1 tablet (25 mg total) by mouth daily. 01/14/21 03/15/21  Elgergawy, Silver Huguenin, MD      Allergies    Patient has no known allergies.    Review of Systems   Review of Systems  Constitutional:  Positive for fatigue. Negative for fever.  Respiratory:  Positive for cough and shortness of breath.   Cardiovascular:  Positive for leg swelling. Negative for chest pain and palpitations.  Gastrointestinal:  Negative for abdominal pain, diarrhea and vomiting.  Genitourinary:  Negative for flank pain.  Musculoskeletal:  Negative for back pain.  Skin:  Negative for rash.  Neurological:  Positive for syncope. Negative for headaches.   Physical Exam Updated Vital Signs BP 102/81    Pulse 68    Temp 98.2 F (36.8 C) (Oral)    Resp 19    Ht 5\' 8"  (1.727 m)    Wt 99.3 kg    SpO2 98%    BMI 33.30 kg/m  Physical Exam Vitals and nursing note reviewed.  Constitutional:      General: He is not in acute distress.  Appearance: Normal appearance.  HENT:     Head: Normocephalic.     Mouth/Throat:     Mouth: Mucous membranes are moist.  Cardiovascular:     Rate and Rhythm: Normal rate.  Pulmonary:     Effort: Pulmonary effort is normal. No respiratory distress.     Breath sounds: Rales present.  Abdominal:     Palpations: Abdomen is soft.     Tenderness: There is no abdominal tenderness.  Musculoskeletal:     Right lower leg: No edema.     Left lower leg: No edema.  Skin:    General: Skin is warm.  Neurological:     Mental Status: He is alert and oriented to person, place, and time. Mental status is at baseline.  Psychiatric:        Mood and Affect: Mood normal.    ED Results / Procedures /  Treatments   Labs (all labs ordered are listed, but only abnormal results are displayed) Labs Reviewed  CBC - Abnormal; Notable for the following components:      Result Value   RBC 5.90 (*)    RDW 16.8 (*)    Platelets 130 (*)    All other components within normal limits  URINALYSIS, ROUTINE W REFLEX MICROSCOPIC - Abnormal; Notable for the following components:   Protein, ur 30 (*)    Leukocytes,Ua LARGE (*)    All other components within normal limits  COMPREHENSIVE METABOLIC PANEL - Abnormal; Notable for the following components:   Creatinine, Ser 1.46 (*)    Total Bilirubin 2.8 (*)    GFR, Estimated 51 (*)    All other components within normal limits  BRAIN NATRIURETIC PEPTIDE - Abnormal; Notable for the following components:   B Natriuretic Peptide 1,445.1 (*)    All other components within normal limits  TROPONIN I (HIGH SENSITIVITY) - Abnormal; Notable for the following components:   Troponin I (High Sensitivity) 22 (*)    All other components within normal limits  TROPONIN I (HIGH SENSITIVITY) - Abnormal; Notable for the following components:   Troponin I (High Sensitivity) 24 (*)    All other components within normal limits  RESP PANEL BY RT-PCR (FLU A&B, COVID) ARPGX2  D-DIMER, QUANTITATIVE  CBG MONITORING, ED    EKG None  Radiology DG Chest Port 1 View  Result Date: 06/12/2021 CLINICAL DATA:  Shortness of breath EXAM: PORTABLE CHEST 1 VIEW COMPARISON:  05/24/2021 FINDINGS: Unchanged position of left chest cardiac device and lead. Unchanged cardiomegaly. Unchanged mediastinal contours. Unchanged vascular congestion without overt pulmonary edema. No focal pulmonary opacity. No definite pleural effusion. No acute osseous abnormality. IMPRESSION: Cardiomegaly with vascular congestion but without overt pulmonary edema. Electronically Signed   By: Merilyn Baba M.D.   On: 06/12/2021 12:03    Procedures .Critical Care Performed by: Lorelle Gibbs, DO Authorized by:  Lorelle Gibbs, DO   Critical care provider statement:    Critical care time (minutes):  45   Critical care time was exclusive of:  Separately billable procedures and treating other patients   Critical care was necessary to treat or prevent imminent or life-threatening deterioration of the following conditions:  Respiratory failure   Critical care was time spent personally by me on the following activities:  Development of treatment plan with patient or surrogate, evaluation of patient's response to treatment, examination of patient, ordering and review of laboratory studies, ordering and review of radiographic studies, ordering and performing treatments and interventions, pulse oximetry, re-evaluation of  patient's condition, review of old charts and discussions with consultants   I assumed direction of critical care for this patient from another provider in my specialty: no     Care discussed with: admitting provider      Medications Ordered in ED Medications  benzonatate (TESSALON) capsule 200 mg (200 mg Oral Given 06/12/21 1417)  furosemide (LASIX) injection 20 mg (20 mg Intravenous Given 06/12/21 1419)    ED Course/ Medical Decision Making/ A&P                           Medical Decision Making  This patient presents to the ED for concern of shortness of breath, this involves an extensive number of treatment options, and is a complaint that carries with it a high risk of complications and morbidity.  The differential diagnosis includes pneumonia, PE, flu/COVID, CHF   Additional history obtained: -External records from outside source obtained and reviewed including: Chart review including previous notes, labs, imaging, consultation notes   Lab Tests: -I ordered, reviewed, and interpreted labs.  The pertinent results include: Troponin of 24 which is around baseline, BNP over 1400.  Creatinine of 1.46, flu and COVID-negative.   EKG -Sinus rhythm with PVCs, no acute ischemic  changes   Imaging Studies ordered: -I ordered imaging studies including chest x-ray -I independently visualized and interpreted imaging which showed cardiomegaly and pulmonary vascular congestion -I agree with the radiologist interpretation   Medicines ordered and prescription drug management: -I ordered medication including Lasix and supplemental oxygen for symptoms -Reevaluation of the patient after these medicines showed that the patient improved -I have reviewed the patients home medicines and have made adjustments as needed   ED Course: 71 year old male presents emergency department concern for worsening shortness of breath.  He does have supplemental oxygen at home that he has been increasing.  Was hypoxic on arrival.  He is conversationally dyspneic and at times tachypneic but no respiratory distress.  He has been on nasal cannula ranging anywhere from 2 L to 4 L.  Chest x-ray shows cardiomegaly with pulmonary vascular congestion.  Blood work-up and exam is consistent with CHF exacerbation.  D-dimer is negative.  Flu and COVID is negative.  Plan for admission for CHF exacerbation.   Critical Interventions: IV diuresis and supplemental oxygen   Cardiac Monitoring: The patient was maintained on a cardiac monitor.  I personally viewed and interpreted the cardiac monitored which showed an underlying rhythm of: Sinus rhythm   Reevaluation: After the interventions noted above, I reevaluated the patient and found that they have :improved   Dispostion: Patients evaluation and results requires admission for further treatment and care.  Spoke with hospitalist Dr. Benny Lennert, reviewed patient's ED course and they accept admission.  Patient agrees with admission plan, offers no new complaints and is stable/unchanged at time of admit.        Final Clinical Impression(s) / ED Diagnoses Final diagnoses:  None    Rx / DC Orders ED Discharge Orders     None         Lorelle Gibbs, DO 06/12/21 1436

## 2021-06-12 NOTE — Plan of Care (Signed)
°  Problem: Education: Goal: Ability to demonstrate management of disease process will improve Outcome: Progressing   Problem: Activity: Goal: Capacity to carry out activities will improve Outcome: Not Progressing   Problem: Cardiac: Goal: Ability to achieve and maintain adequate cardiopulmonary perfusion will improve Outcome: Not Progressing

## 2021-06-12 NOTE — ED Notes (Signed)
Pt placed on 1L [place] per his request, states, "It helps me breathe better"

## 2021-06-12 NOTE — ED Notes (Signed)
Pt states he wears 2L of oxygen during the day and cpap at night.  Pt requesting something to eat, provider made aware

## 2021-06-12 NOTE — ED Notes (Signed)
RT Note: Nasal Swab obtained/[facility] labelled/given to Lab, RN made aware.

## 2021-06-12 NOTE — H&P (Signed)
History and Physical   Duke Mullineaux [id number] DOB: 1950/07/26 DOA: 06/12/2021  PCP: [facility]   Patient coming from: Home  Chief Complaint: Shortness of breath  HPI: Evan Serrano is a 71 y.o. male with medical history significant of CHF, syncope, orthostatic hypotension, CKD, V. tach status post pacemaker, hypertension, hyperlipidemia, obesity, OSA on CPAP, PTSD, paroxysmal A. fib who presents with worsening shortness of breath.  Patient states he has had worsening shortness of breath for the past 2 months.  He also reports cough which has been nonproductive.  He also states at times when he coughs he gets dizzy has had 2 syncopal episodes.  As above he does have a history of orthostatic syncope.  He also reports decreased appetite.  He has had intermittent swelling of his lower extremities.  He states he did see his primary doctor last week who [place] [facility] [place], but he did not at that time as he would prefer to come to the [facility].  He came in today due to worsening symptoms.  He reports chronic cough.  He also states when he has significant intractable cough he at times will have syncopal episodes.  He denies fevers, chills, chest pain, abdominal pain, constipation, diarrhea, nausea, vomiting.  ED Course: Vital signs in the ED significant for blood pressure ranging in the [id number] to 90 systolic with normal maps.  Requiring 2 to 4 L to maintain saturations.  Lab work-up showed CMP with creatinine 1.46 which is stable for him and T bili elevated at 2.8.  CBC showed platelets of 130.  Troponin stably elevated at 22 and then 24 and repeat.  D-dimer normal.  BNP elevated at greater than 1400.  Respiratory panel for flu and COVID-negative.  Urinalysis with large leukocytes only.  Chest x-ray showed cardiomegaly and vascular congestion.  Patient received a dose of IV Lasix in the ED.  Review of Systems: As  per HPI otherwise all other systems reviewed and are negative.  Past Medical History:  Diagnosis Date   Cardiac pacemaker    COVID    Hypertension    Presence of cardiac defibrillator     Past Surgical History:  Procedure Laterality Date   PACEMAKER IMPLANT      Social History  reports that he has never smoked. He has never used smokeless tobacco. He reports that he does not drink alcohol and does not use drugs.  No Known Allergies  Family History  Problem Relation Age of Onset   Heart attack Mother    Heart attack Father   Reviewed on admission  Prior to Admission medications   Medication Sig Start Date End Date Taking? Authorizing Provider  aspirin 81 MG EC tablet Take 81 mg by mouth daily.   Yes [provider]  carvedilol (COREG) 12.5 MG tablet Take 6.25 mg by mouth 2 (two) times daily with a meal.   Yes [provider]  feeding supplement (ENSURE ENLIVE / ENSURE PLUS) LIQD Take 237 mLs by mouth 2 (two) times daily between meals. 01/14/21  Yes Elgergawy, Silver Huguenin, MD  Multiple Vitamin (MULTIVITAMIN WITH MINERALS) TABS tablet Take 1 tablet by mouth daily. 01/14/21  Yes Elgergawy, Silver Huguenin, MD  simvastatin (ZOCOR) 10 MG tablet Take 10 mg by mouth at bedtime.   Yes [provider]  spironolactone (ALDACTONE) 25 MG tablet Take 0.5 tablets (12.5 mg total) by mouth daily. 04/27/19  Yes Thurnell Lose, MD  torsemide (  DEMADEX) 20 MG tablet Take 0.5-1 tablets (10-20 mg total) by mouth See admin instructions. Take 10mg  by mouth daily.  If weight gain of 5lbs or more, then take 20mg  daily for two days.  If weight does not decrease, then see MD. 05/24/21  Yes Vanetta Mulders, MD  Vitamin D, Cholecalciferol, 10 MCG (400 UNIT) CAPS Take 800 Units by mouth daily.   Yes [provider]  losartan (COZAAR) 25 MG tablet Take 1 tablet (25 mg total) by mouth daily. 01/14/21 03/15/21  Elgergawy, Leana Roe, MD    Physical Exam: Vitals:   06/12/21 1300  06/12/21 1330 06/12/21 1600 06/12/21 1814  BP: 97/81 102/81 (!) 84/72 (!) 109/94  Pulse: 66 68 74 67  Resp: (!) 24 19 20 20   Temp:    97.7 F (36.5 C)  TempSrc:    Oral  SpO2: 96% 98% 95%   Weight:      Height:       Physical Exam Constitutional:      General: He is not in acute distress.    Appearance: Normal appearance.  HENT:     Head: Normocephalic and atraumatic.     Mouth/Throat:     Mouth: Mucous membranes are moist.     Pharynx: Oropharynx is clear.  Eyes:     Extraocular Movements: Extraocular movements intact.     Pupils: Pupils are equal, round, and reactive to light.  Cardiovascular:     Rate and Rhythm: Normal rate and regular rhythm.     Pulses: Normal pulses.     Heart sounds: Normal heart sounds.  Pulmonary:     Effort: Pulmonary effort is normal. No respiratory distress.     Breath sounds: Rales (trace) present.  Abdominal:     General: Bowel sounds are normal. There is no distension.     Palpations: Abdomen is soft.     Tenderness: There is no abdominal tenderness.  Musculoskeletal:        General: No swelling or deformity.     Right lower leg: No edema.     Left lower leg: No edema.  Skin:    General: Skin is warm and dry.  Neurological:     General: No focal deficit present.     Mental Status: Mental status is at baseline.   Labs on Admission: I have personally reviewed following labs and imaging studies  CBC: Recent Labs  Lab 06/12/21 1030  WBC 5.8  HGB 15.8  HCT 48.9  MCV 82.9  PLT 130*    Basic Metabolic Panel: Recent Labs  Lab 06/12/21 1030 06/12/21 1839  NA 139  --   K 3.6  --   CL 101  --   CO2 29  --   GLUCOSE 92  --   BUN 17  --   CREATININE 1.46*  --   CALCIUM 9.4  --   MG  --  2.3    GFR: Estimated Creatinine Clearance: 53 mL/min (A) (by C-G formula based on SCr of 1.46 mg/dL (H)).  Liver Function Tests: Recent Labs  Lab 06/12/21 1030 06/12/21 1839  AST 23 27  ALT 21 25  ALKPHOS 83 85  BILITOT 2.8* 3.0*   PROT 6.9 7.3  ALBUMIN 3.8 3.7    Urine analysis:    Component Value Date/Time   COLORURINE YELLOW 06/12/2021 1342   APPEARANCEUR CLEAR 06/12/2021 1342   LABSPEC 1.023 06/12/2021 1342   PHURINE 5.5 06/12/2021 1342   GLUCOSEU NEGATIVE 06/12/2021 1342   HGBUR  NEGATIVE 06/12/2021 1342   BILIRUBINUR NEGATIVE 06/12/2021 1342   KETONESUR NEGATIVE 06/12/2021 1342   PROTEINUR 30 (A) 06/12/2021 1342   NITRITE NEGATIVE 06/12/2021 1342   LEUKOCYTESUR LARGE (A) 06/12/2021 1342    Radiological Exams on Admission: DG Chest Port 1 View  Result Date: 06/12/2021 CLINICAL DATA:  Shortness of breath EXAM: PORTABLE CHEST 1 VIEW COMPARISON:  05/24/2021 FINDINGS: Unchanged position of left chest cardiac device and lead. Unchanged cardiomegaly. Unchanged mediastinal contours. Unchanged vascular congestion without overt pulmonary edema. No focal pulmonary opacity. No definite pleural effusion. No acute osseous abnormality. IMPRESSION: Cardiomegaly with vascular congestion but without overt pulmonary edema. Electronically Signed   By: Merilyn Baba M.D.   On: 06/12/2021 12:03    EKG: Independently reviewed.  Sinus rhythm at 75 bpm.  Multiple PVCs.  Low voltage.  Nonspecific intraventricular conduction delay.  Assessment/Plan Principal Problem:   CHF exacerbation (HCC) Active Problems:   Orthostatic hypotension   Renal insufficiency   Implantable cardioverter-defibrillator (ICD) in situ   Hyperlipidemia   Obstructive sleep apnea syndrome  Acute on chronic respiratory failure with hypoxia CHF exacerbation > Last EF in August was 20 to 25% with grade 3 diastolic dysfunction and mildly reduced RV EF.  Also has AICD in place. > Patient presenting with 2 months of worsening shortness of breath.  Intermittent edema.  Had seen his PCP who recommended admission to the [place] but patient declined at that time.  This was a week ago. > BNP elevated to greater than 1400 in the ED.  Troponin trend 22 and 24 and  repeat with third ordered. > Patient on home oxygen 2 L.  Requiring up to 4 L to maintain saturations in ED.  > Did receive a dose of Lasix in the ED. > Blood pressure noted to be soft in the 80s to 100s.  80s occurred more after receiving Lasix, MAP is normal.  Does have history of soft blood pressures. - Monitor on telemetry - Hold off on further diuresis given soft blood pressures - Given his history of low output heart failure and soft blood pressures may benefit from cardiology consult in the morning. - Hold home carvedilol given his low blood pressure - Holding spironolactone, this is in our system but I did not see it on his [facility] med list - Trend renal function electrolytes - Ins and outs, daily weights - Check magnesium ADDENDUM > Accepting provider who spoke with the EDP earlier in the evening noted decreasing blood pressures and had consulted cardiology. > Cardiology recommended transfer to [facility] for further evaluation given his degree of heart failure. > Most recent blood pressure reading was in the 100s and patient appears stable at the moment.  However given his significant history and intermittent hypotension agree with transfer to [facility] where he can receive interventional cardiology if needed and have access to the advanced heart failure team. -Transfer to [facility] progressive ordered. - Accepting provider, Dr. Benny Lennert, spoke with cardiologist as above and also spoke with the control to help expedite transfer.  History of orthostatic hypotension > History of this in the chart.  Low normal to mild hypotension in the ED.   - Monitor closely - Up with assistance for now  T. Bili elevation > Currently no abdominal symptoms. - Check fractionated bili and trend   Hyperlipidemia - Continue home simvastatin  OSA - Continue home CPAP nightly - Paroxysmal A. fib - Holding carvedilol in the setting of hypotension with normal maps for  now. - Continue with home Eliquis    DVT prophylaxis: Eliquis Code Status:   Full Family Communication:  None on admission Disposition Plan:   Patient is from:  Home  Anticipated DC to:  Home  Anticipated DC date:  2 to 3 days  Anticipated DC barriers: None  Consults called:  None.  May benefit from cardiology consult in the morning.  Admission status:  Inpatient, telemetry   Severity of Illness: The appropriate patient status for this patient is OBSERVATION. Observation status is judged to be reasonable and necessary in order to provide the required intensity of service to ensure the patient's safety. The patient's presenting symptoms, physical exam findings, and initial radiographic and laboratory data in the context of their medical condition is felt to place them at decreased risk for further clinical deterioration. Furthermore, it is anticipated that the patient will be medically stable for discharge from the hospital within 2 midnights of admission.    Marcelyn Bruins MD [facility]  How to contact the [facility] Attending or Consulting provider [place] or covering provider during after hours [place], for this patient?   Check the care team in [facility] and look for a) attending/consulting [facility] provider listed and b) the [facility] team listed Log into [email or web address] and use [place] universal password to access. If you do not have the password, please contact the hospital operator. Locate the [facility] provider you are looking for under [facility] and page to a number that you can be directly reached. If you still have difficulty reaching the provider, please page the [facility] (Director on Call) for the Hospitalists listed on amion for assistance.  06/12/2021, 7:54 PM

## 2021-06-12 NOTE — Progress Notes (Signed)
Patient refuses nocturnal CPAP tonight. RT will continue to follow and encourage use.

## 2021-06-12 NOTE — ED Notes (Signed)
RT Note: Finger Stick obtained for Blood Sugar.

## 2021-06-12 NOTE — ED Notes (Signed)
Report given to [employer] with [facility]

## 2021-06-13 ENCOUNTER — Encounter: Payer: Self-pay | Admitting: Internal Medicine

## 2021-06-13 ENCOUNTER — Inpatient Hospital Stay (HOSPITAL_COMMUNITY): Payer: No Typology Code available for payment source

## 2021-06-13 DIAGNOSIS — I4729 Other ventricular tachycardia: Secondary | ICD-10-CM

## 2021-06-13 DIAGNOSIS — G4733 Obstructive sleep apnea (adult) (pediatric): Secondary | ICD-10-CM | POA: Diagnosis not present

## 2021-06-13 DIAGNOSIS — I5023 Acute on chronic systolic (congestive) heart failure: Secondary | ICD-10-CM | POA: Diagnosis not present

## 2021-06-13 DIAGNOSIS — Z95810 Presence of automatic (implantable) cardiac defibrillator: Secondary | ICD-10-CM | POA: Diagnosis not present

## 2021-06-13 DIAGNOSIS — I951 Orthostatic hypotension: Secondary | ICD-10-CM

## 2021-06-13 DIAGNOSIS — I5043 Acute on chronic combined systolic (congestive) and diastolic (congestive) heart failure: Secondary | ICD-10-CM | POA: Diagnosis not present

## 2021-06-13 DIAGNOSIS — N289 Disorder of kidney and ureter, unspecified: Secondary | ICD-10-CM

## 2021-06-13 DIAGNOSIS — N1831 Chronic kidney disease, stage 3a: Secondary | ICD-10-CM

## 2021-06-13 LAB — COMPREHENSIVE METABOLIC PANEL
ALT: 12 U/L (ref 0–44)
AST: 23 U/L (ref 15–41)
Albumin: 3.3 g/dL — ABNORMAL LOW (ref 3.5–5.0)
Alkaline Phosphatase: 76 U/L (ref 38–126)
Anion gap: 9 (ref 5–15)
BUN: 18 mg/dL (ref 8–23)
CO2: 27 mmol/L (ref 22–32)
Calcium: 9.1 mg/dL (ref 8.9–10.3)
Chloride: 103 mmol/L (ref 98–111)
Creatinine, Ser: 1.48 mg/dL — ABNORMAL HIGH (ref 0.61–1.24)
GFR, Estimated: 50 mL/min — ABNORMAL LOW (ref >60)
Glucose, Bld: 89 mg/dL (ref 70–99)
Potassium: 3.8 mmol/L (ref 3.5–5.1)
Sodium: 139 mmol/L (ref 135–145)
Total Bilirubin: 2.6 mg/dL — ABNORMAL HIGH (ref 0.3–1.2)
Total Protein: 6.2 g/dL — ABNORMAL LOW (ref 6.5–8.1)

## 2021-06-13 LAB — CBC
HCT: 43.8 % (ref 39.0–52.0)
Hemoglobin: 14.3 g/dL (ref 13.0–17.0)
MCH: 27.6 pg (ref 26.0–34.0)
MCHC: 32.6 g/dL (ref 30.0–36.0)
MCV: 84.4 fL (ref 80.0–100.0)
Platelets: 129 10*3/uL — ABNORMAL LOW (ref 150–400)
RBC: 5.19 MIL/uL (ref 4.22–5.81)
RDW: 16.3 % — ABNORMAL HIGH (ref 11.5–15.5)
WBC: 6.7 10*3/uL (ref 4.0–10.5)
nRBC: 0 % (ref 0.0–0.2)

## 2021-06-13 LAB — ECHOCARDIOGRAM COMPLETE
AR max vel: 1.85 cm2
AV Area VTI: 2.04 cm2
AV Area mean vel: 1.84 cm2
AV Mean grad: 2 mmHg
AV Peak grad: 3.7 mmHg
Ao pk vel: 0.96 m/s
Calc EF: 29.9 %
Height: 68 in
MV VTI: 0.29 cm2
P 1/2 time: 825 msec
S' Lateral: 6.5 cm
Single Plane A2C EF: 25.6 %
Single Plane A4C EF: 33.3 %
Weight: 3548.52 oz

## 2021-06-13 MED ORDER — INFLUENZA VAC A&B SA ADJ QUAD 0.5 ML IM PRSY
0.5000 mL | PREFILLED_SYRINGE | INTRAMUSCULAR | Status: AC
  Administered 2021-06-15: 0.5 mL via INTRAMUSCULAR
  Filled 2021-06-13: qty 0.5

## 2021-06-13 MED ORDER — HEPARIN BOLUS VIA INFUSION
1000.0000 [IU] | Freq: Once | INTRAVENOUS | Status: AC
  Administered 2021-06-13: 1000 [IU] via INTRAVENOUS
  Filled 2021-06-13: qty 1000

## 2021-06-13 MED ORDER — SODIUM CHLORIDE 0.9 % IV SOLN: INTRAVENOUS | Status: DC

## 2021-06-13 MED ORDER — FUROSEMIDE 10 MG/ML IJ SOLN
80.0000 mg | Freq: Two times a day (BID) | INTRAMUSCULAR | Status: DC
  Administered 2021-06-13 – 2021-06-17 (×8): 80 mg via INTRAVENOUS
  Filled 2021-06-13 (×8): qty 8

## 2021-06-13 MED ORDER — SODIUM CHLORIDE 0.9% FLUSH
3.0000 mL | INTRAVENOUS | Status: DC | PRN
Start: 2021-06-13 — End: 2021-06-14

## 2021-06-13 MED ORDER — SODIUM CHLORIDE 0.9% FLUSH: 3.0000 mL | Freq: Two times a day (BID) | INTRAVENOUS | Status: DC

## 2021-06-13 MED ORDER — PNEUMOCOCCAL VAC POLYVALENT 25 MCG/0.5ML IJ INJ
0.5000 mL | INJECTION | INTRAMUSCULAR | Status: AC
  Administered 2021-06-15: 0.5 mL via INTRAMUSCULAR
  Filled 2021-06-13: qty 0.5

## 2021-06-13 MED ORDER — HEPARIN (PORCINE) 25000 UT/250ML-% IV SOLN
1050.0000 [IU]/h | INTRAVENOUS | Status: DC
  Administered 2021-06-13: 1400 [IU]/h via INTRAVENOUS
  Filled 2021-06-13 (×2): qty 250

## 2021-06-13 MED ORDER — SODIUM CHLORIDE 0.9 % IV SOLN: 250.0000 mL | INTRAVENOUS | Status: DC | PRN

## 2021-06-13 MED ORDER — ASPIRIN 81 MG PO CHEW
81.0000 mg | CHEWABLE_TABLET | ORAL | Status: AC
  Administered 2021-06-14: 81 mg via ORAL
  Filled 2021-06-13: qty 1

## 2021-06-13 MED ORDER — ASPIRIN EC 81 MG PO TBEC
81.0000 mg | DELAYED_RELEASE_TABLET | Freq: Every day | ORAL | Status: DC
  Administered 2021-06-15 – 2021-06-17 (×3): 81 mg via ORAL
  Filled 2021-06-13 (×3): qty 1

## 2021-06-13 MED ORDER — POTASSIUM CHLORIDE CRYS ER 20 MEQ PO TBCR
40.0000 meq | EXTENDED_RELEASE_TABLET | Freq: Once | ORAL | Status: AC
  Administered 2021-06-13: 40 meq via ORAL
  Filled 2021-06-13: qty 2

## 2021-06-13 NOTE — Progress Notes (Signed)
Pt declined nocturnal cpap at this time.  Pt was encouraged to let his nurse know if he changes his mind.  RN aware.

## 2021-06-13 NOTE — Progress Notes (Signed)
RN called to confirm [facility] setup for transfer, contact person [place]

## 2021-06-13 NOTE — Progress Notes (Signed)
ANTICOAGULATION CONSULT NOTE - Initial Consult  Pharmacy Consult for Heparin while Eliquis on hold Indication: atrial fibrillation  No Known Allergies  Patient Measurements: Height: 5\' 8"  (172.7 cm) Weight: 100.6 kg (221 lb 12.5 oz) IBW/kg (Calculated) : 68.4 Heparin Dosing Weight: 89.7 kg  Vital Signs: Temp: 97.4 F (36.3 C) (01/12 0338) Temp Source: Oral (01/12 0338) BP: 104/87 (01/12 0338) Pulse Rate: 66 (01/12 0338)  Labs: Recent Labs    06/12/21 1030 06/12/21 1342 06/12/21 1839 06/13/21 0336  HGB 15.8  --   --  14.3  HCT 48.9  --   --  43.8  PLT 130*  --   --  129*  CREATININE 1.46*  --   --  1.48*  TROPONINIHS 22* 24* 21*  --     Estimated Creatinine Clearance: 52.6 mL/min (A) (by C-G formula based on SCr of 1.48 mg/dL (H)).   Medical History: Past Medical History:  Diagnosis Date   Cardiac pacemaker    COVID    Hypertension    Presence of cardiac defibrillator     Medications:  Scheduled:   apixaban  5 mg Oral BID   aspirin EC  81 mg Oral Daily   feeding supplement  237 mL Oral BID BM   furosemide  80 mg Intravenous BID   [START ON 06/14/2021] influenza vaccine adjuvanted  0.5 mL Intramuscular Tomorrow-1000   multivitamin with minerals  1 tablet Oral Daily   [START ON 06/14/2021] pneumococcal 23 valent vaccine  0.5 mL Intramuscular Tomorrow-1000   simvastatin  10 mg Oral QHS   sodium chloride flush  3 mL Intravenous Q12H   Infusions:  PRN: acetaminophen **OR** acetaminophen  Assessment: 71 yo male on chronic eliquis for afib who presents with acute on chronic systolic and diastolic heart failure.  Pharmacy consulted to transition apixaban to IV heparin with plans for cardiac cath.  Last dose of apixaban today at 01:21  Goal of Therapy:  Heparin level 0.3-0.7 units/ml Heparin level 66-102 units/ml Monitor platelets by anticoagulation protocol: Yes   Plan:  Give low heparin bolus 1000 units IV x 1 Heparin infusion 1400 units/hr Check aPTT  and heparin level 8hrs after starting drip Daily aPTT, heparin level and CBC  Peggyann Juba, PharmD, BCPS Pharmacy: [phone] 06/13/2021,2:52 PM

## 2021-06-13 NOTE — Progress Notes (Signed)
° °  Echocardiogram 2D Echocardiogram has been performed.  Festus Barren 06/13/2021, 1:14 PM

## 2021-06-13 NOTE — Progress Notes (Signed)
Report called, given to [employer].  Pt will go to room [facility]- 3 E 02C - 01 PCU - CHF unit   Pt notified of bed ready at [facility]

## 2021-06-13 NOTE — Progress Notes (Signed)
PROGRESS NOTE  Evan Serrano [id number] DOB: 06-13-1950   PCP: [facility]  Patient is from: Home.  Lives with his wife.  Uses cane at baseline.  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  BNP elevated to 1400.  CXR consistent with CHF.  Received IV Lasix 20 mg in ED and became hypotensive with systolic in [id number] and 0000000.  Cardiology consulted and recommended transfer to [facility] for further care and evaluation.  Subjective: Seen and examined earlier this morning.  Nonsustained VT's on telemetry overnight.  Reports some improvement in his breathing.  He says, he is able to eat now.  Continues to endorse productive cough with clear phlegm.  He admits to orthopnea and PND over the last 2 weeks.  He denies blood in stool.  Admits to weight loss due to poor appetite/poor p.o. intake.  Objective: Vitals:   06/13/21 0120 06/13/21 0213 06/13/21 0338 06/13/21 0700  BP: 104/77  104/87   Pulse: 67 73 66   Resp: 16 20 20    Temp: 98.1 F (36.7 C)  (!) 97.4 F (36.3 C)   TempSrc: Oral  Oral   SpO2: 100% 100% 98%   Weight:    100.6 kg  Height:        Examination:  GENERAL: No apparent distress.  Nontoxic. HEENT: MMM.  Vision and hearing grossly intact.  NECK: Supple.  No apparent JVD but sitting upright.  RESP: 98% on 4 L.  Feels short of breath when he speaks.  Fair aeration bilaterally. CVS:  RRR. Heart sounds normal.  ABD/GI/GU: BS+. Abd soft, NTND.  MSK/EXT:  Moves extremities. No apparent deformity. No edema.   SKIN: no apparent skin lesion or wound NEURO: Awake, alert and oriented appropriately.  No apparent focal neuro deficit. PSYCH: Calm. Normal affect.   Procedures:  None  Microbiology summarized: [id number] and influenza PCR nonreactive.  Assessment & Plan: Acute on chronic respiratory failure with hypoxia due to acute on chronic combined CHF: POA.  Desaturated to 88% on 2 L requiring 4 L to maintain saturation in 90s.  BNP elevated to 1445 but seems chronic.  CXR consistent with acute CHF.  TTE in 12/2020 with LVEF of 20 to 25%, GH, G3 DD, RVSP of 40 mmHg, moderate LAE, mild to moderate MVR.  He endorses progressive dyspnea, DOE, orthopnea and PND.  Due to noncompliance?  Started on low-dose IV Lasix but became hypotensive.  I&O incomplete. -Cardiology following -Plan for transfer to [facility] -Monitor fluid status, renal functions and electrolytes. -Sodium and fluid restriction -Wean oxygen as able -Follow echocardiogram -Continue holding home Aldactone, losartan and Demadex  Nonsustained VT-patient with history of VT and ICD in place. -Optimize electrolytes  Recurrent syncope/orthostatic hypotension: Normotensive this morning. -Fall precaution -Continue telemetry monitoring -TED hose -Check TSH in the morning -Follow echocardiogram  Paroxysmal A. fib: On Coreg and Eliquis at home. -Continue Eliquis -Defer rate control to cardiology.  Prefer metoprolol versus Coreg given hypotension    Hyperbilirubinemia: Total bili 3.0.  Indirect 2.4.  Hemolysis?  Gilbert's syndrome?  Hgb within normal. -Check haptoglobin and LDH -Continue monitoring  OSA on CPAP -Continue nightly CPAP.  Debility/physical deconditioning: Uses cane at baseline. -PT/OT eval  Class I obesity: Body mass index is 33.72 kg/m.         DVT prophylaxis:   apixaban (ELIQUIS) tablet 5 mg  Code Status: Full code Family Communication: Patient and/or RN. Available if any question.  Level of care:  Progressive Status is: Inpatient  Remains inpatient appropriate because: Acute on chronic combined CHF with hypotension       Consultants:  Cardiology   Sch Meds:  Scheduled Meds:  apixaban  5 mg Oral BID   aspirin EC  81 mg Oral Daily   feeding supplement  237 mL Oral BID BM   [START ON 06/14/2021] influenza vaccine adjuvanted  0.5 mL Intramuscular Tomorrow-1000   multivitamin with minerals  1 tablet Oral Daily   [START ON 06/14/2021] pneumococcal 23 valent vaccine  0.5 mL Intramuscular Tomorrow-1000   simvastatin  10 mg Oral QHS   sodium chloride flush  3 mL Intravenous Q12H   Continuous Infusions: PRN Meds:.acetaminophen **OR** acetaminophen  Antimicrobials: Anti-infectives (From admission, onward)    None        I have personally reviewed the following labs and images: CBC: Recent Labs  Lab 06/12/21 1030 06/13/21 0336  WBC 5.8 6.7  HGB 15.8 14.3  HCT 48.9 43.8  MCV 82.9 84.4  PLT 130* 129*   BMP &GFR Recent Labs  Lab 06/12/21 1030 06/12/21 1839 06/13/21 0336  NA 139  --  139  K 3.6  --  3.8  CL 101  --  103  CO2 29  --  27  GLUCOSE 92  --  89  BUN 17  --  18  CREATININE 1.46*  --  1.48*  CALCIUM 9.4  --  9.1  MG  --  2.3  --    Estimated Creatinine Clearance: 52.6 mL/min (A) (by C-G formula based on SCr of 1.48 mg/dL (H)). Liver & Pancreas: Recent Labs  Lab 06/12/21 1030 06/12/21 1839 06/13/21 0336  AST 23 27 23   ALT 21 25 12   ALKPHOS 83 85 76  BILITOT 2.8* 3.0* 2.6*  PROT 6.9 7.3 6.2*  ALBUMIN 3.8 3.7 3.3*   No results for input(s): LIPASE, AMYLASE in the last 168 hours. No results for input(s): AMMONIA in the last 168 hours. Diabetic: No results for input(s): HGBA1C in the last 72 hours. Recent Labs  Lab 06/12/21 1037  GLUCAP 99   Cardiac Enzymes: No results for input(s): CKTOTAL, CKMB, CKMBINDEX, TROPONINI in the last 168 hours. No results for input(s): PROBNP in the last 8760 hours. Coagulation Profile: No results for  input(s): INR, PROTIME in the last 168 hours. Thyroid Function Tests: No results for input(s): TSH, T4TOTAL, FREET4, T3FREE, THYROIDAB in the last 72 hours. Lipid Profile: No results for input(s): CHOL, HDL, LDLCALC, TRIG, CHOLHDL, LDLDIRECT in the last 72 hours. Anemia Panel: No results for input(s): VITAMINB12, FOLATE, FERRITIN, TIBC, IRON, RETICCTPCT in the last 72 hours. Urine analysis:    Component Value Date/Time   COLORURINE YELLOW 06/12/2021 1342   APPEARANCEUR CLEAR 06/12/2021 1342   LABSPEC 1.023 06/12/2021 1342   PHURINE 5.5 06/12/2021 1342   GLUCOSEU NEGATIVE 06/12/2021 1342   HGBUR NEGATIVE 06/12/2021 1342   BILIRUBINUR NEGATIVE 06/12/2021 1342   KETONESUR NEGATIVE 06/12/2021 1342   PROTEINUR 30 (A) 06/12/2021 1342   NITRITE NEGATIVE 06/12/2021 1342  LEUKOCYTESUR LARGE (A) 06/12/2021 1342   Sepsis Labs: Invalid input(s): PROCALCITONIN, [place]  Microbiology: Recent Results (from the past 240 hour(s))  Resp Panel by RT-PCR (Flu A&B, Covid) Nasopharyngeal Swab     Status: None   Collection Time: 06/12/21 11:58 AM   Specimen: Nasopharyngeal Swab; Nasopharyngeal(NP) swabs in vial transport medium  Result Value Ref Range Status   SARS Coronavirus 2 by RT PCR NEGATIVE NEGATIVE Final    Comment: (NOTE) SARS-CoV-2 target nucleic acids are NOT DETECTED.  The SARS-CoV-2 RNA is generally detectable in upper respiratory specimens during the acute phase of infection. The lowest concentration of SARS-CoV-2 viral copies this assay can detect is 138 copies/mL. A negative result does not preclude SARS-Cov-2 infection and should not be used as the sole basis for treatment or other patient management decisions. A negative result may occur with  improper specimen collection/handling, submission of specimen other than nasopharyngeal swab, presence of viral mutation(s) within the areas targeted by this assay, and inadequate number of viral copies(<138 copies/mL). A negative  result must be combined with clinical observations, patient history, and epidemiological information. The expected result is Negative.  Fact Sheet for Patients:  EntrepreneurPulse.com.au  Fact Sheet for Healthcare Providers:  IncredibleEmployment.be  This test is no t yet approved or cleared by the [place] FDA and  has been authorized for detection and/or diagnosis of SARS-CoV-2 by FDA under an Emergency Use Authorization (EUA). This EUA will remain  in effect (meaning this test can be used) for the duration of the COVID-19 declaration under Section 564(b)(1) of the Act, 21 U.S.C.section 360bbb-3(b)(1), unless the authorization is terminated  or revoked sooner.       Influenza A by PCR NEGATIVE NEGATIVE Final   Influenza B by PCR NEGATIVE NEGATIVE Final    Comment: (NOTE) The Xpert Xpress SARS-CoV-2/FLU/RSV plus assay is intended as an aid in the diagnosis of influenza from Nasopharyngeal swab specimens and should not be used as a sole basis for treatment. Nasal washings and aspirates are unacceptable for Xpert Xpress SARS-CoV-2/FLU/RSV testing.  Fact Sheet for Patients: EntrepreneurPulse.com.au  Fact Sheet for Healthcare Providers: IncredibleEmployment.be  This test is not yet approved or cleared by the [place] FDA and has been authorized for detection and/or diagnosis of SARS-CoV-2 by FDA under an Emergency Use Authorization (EUA). This EUA will remain in effect (meaning this test can be used) for the duration of the COVID-19 declaration under Section 564(b)(1) of the Act, 21 U.S.C. section 360bbb-3(b)(1), unless the authorization is terminated or revoked.  Performed at [facility], [address], [place], [place] [address]     Radiology Studies: DG Chest [facility] 1 View  Result Date: 06/12/2021 CLINICAL DATA:  Shortness of breath EXAM: PORTABLE CHEST 1 VIEW  COMPARISON:  05/24/2021 FINDINGS: Unchanged position of left chest cardiac device and lead. Unchanged cardiomegaly. Unchanged mediastinal contours. Unchanged vascular congestion without overt pulmonary edema. No focal pulmonary opacity. No definite pleural effusion. No acute osseous abnormality. IMPRESSION: Cardiomegaly with vascular congestion but without overt pulmonary edema. Electronically Signed   By: Merilyn Baba M.D.   On: 06/12/2021 12:03       Mikka Kissner T. [place]  If 7PM-7AM, please contact night-coverage [email or web address] 06/13/2021, 11:38 AM

## 2021-06-13 NOTE — Progress Notes (Addendum)
Central tele notified RN of pt with 3 VT run. Pt resting and not in any acute distress or pain. On-call provider notified and recommended use of CPAP. RN educated pt on benefit of CPAP and pt agreed to attempt wearing for the rest of the night.  Pt had 6 beat of VT and found resting on back with CPAP on. Vital signs stable. On-call provider notified. Labs pending. RN continues to monitor 4:00 AM

## 2021-06-13 NOTE — Telephone Encounter (Signed)
error 

## 2021-06-13 NOTE — Consult Note (Signed)
Cardiology Consultation:   Patient ID: Evan Serrano MRN: [id number]; DOB: 1950-12-22  Admit date: 06/12/2021 Date of Consult: 06/13/2021  PCP:  Clinic, [facility]   [facility] Providers Cardiologist:  Chrystie Nose, MD new, received care from [facility]  Patient Profile:   Evan Serrano is a 71 y.o. male with a hx of HFrEF, ICD in place, HTN, orthostatic hypotension, renal insufficiency, PAF on eliquis, and OSA on CPAP who is being seen 06/13/2021 for the evaluation of CHF exacerbation at the request of Dr. Alanda Slim.  History of Present Illness:   Mr. Hennington has a history of HFrEF with ICD in place. He received his care through the [place] in [place]. He was hospitalized in Aug 2022 with CHF exacerbation. Echo at that time showed LVEF 20-25% with grade III DD, mild MR, mildly reduced RV fx. Cardiology was not consulted during that admission. He was diuresed and instructed to [facility].   He is a never smoker. He has a history of syncope felt related to orthostatic hypotension.   PTA: ASA, eliquis (PAF), coreg, spiro, losartan, lasix, and statin.  He was seen at [facility] 05/24/21 with cough and syncope, found to be hypotensive. He had reduced PO intake and improved with IVF. He had not been taking his home torsemide 20 mg. BNP at that time was 1500. He was discharged without admission and stated he would take home torsemide.  He presented to [facility] 06/12/21 with shortness of breath and lower extremity swelling.    BNP 1445 HS troponin 22 --> 24 --> 21 sCr 1.48 (1.16) K 3.8 Mg 2.3 PLT 129, previously in the 150-180s  He reports having a cold before thanksgiving associated with productive cough and shortness of breath. He reports compliance on his medications. He also reports intermittently poor PO intake. He recounts syncope in the setting of coughing in Dec 2022, likely vagally mediated. He denies lower extremity swelling, abdominal  fullness and weight gain. He does describe orthopnea and PND over the last 2 weeks. He also describes a cramping sensation on the left lateral chest wall. This CP is sharp and lasts about 20 min. It has occurred about 4 times at rest in the last 2 weeks.   He is on chronic O2 therapy since COVID infection in 2020. He uses CPAP about 3 times per week. He states he gets enough air because he sits up with the fan on and window cracked.   He is originally from [facility] and was drafted in the 1970s to the [employer]. He is a [place] veteran. He retired from delivering paper products in [place] and relocated to [place] in 2011. He was first diagnosed with systolic heart failure in 2011 when he became sick with a URI that never improved. Between 2011-2017, he was back and forth between [facility] and [place]. He moved back to [place] permanently in 2017. He had ICD placed in 2018.   He states he has received 4 shocks since his ICD was placed. He states that he was diagnosed with Afib noted on device interrogation around 2018 and was started on eliquis.   He describes a heart catheterization in 2011 noted to be "clean." He denies history of MI, PCI, and stroke. He is a never smoker and does not use alcohol or illicit drugs.  He is one of 12 children and is married with 7 children.   Received callback from [facility] cardiology in [place]. He had an echo in April  2022 with LVEF 20-25%. He wore a Holter monitor at that time that did show Afib, and was ordered for PVC burden, and showed many PSVT, PVCs, and PAT with periods of Wenckebach noted.  ICD Medtronic single chamber implanted in 2018.  Jun 10 2021 - ICD shock received 05/27/21 for one Vfib episode after failed ATP.   She states he has not seen a cardiology provider since June 2022.   Last note being faxed to cath lab.   Past Medical History:  Diagnosis Date   Cardiac pacemaker    COVID    Hypertension    Presence of cardiac defibrillator     Past Surgical History:   Procedure Laterality Date   PACEMAKER IMPLANT       Home Medications:  Prior to Admission medications   Medication Sig Start Date End Date Taking? Authorizing Provider  aspirin 81 MG EC tablet Take 81 mg by mouth daily.   Yes [provider]  carvedilol (COREG) 12.5 MG tablet Take 6.25 mg by mouth 2 (two) times daily with a meal.   Yes [provider]  feeding supplement (ENSURE ENLIVE / ENSURE PLUS) LIQD Take 237 mLs by mouth 2 (two) times daily between meals. 01/14/21  Yes Elgergawy, Silver Huguenin, MD  Multiple Vitamin (MULTIVITAMIN WITH MINERALS) TABS tablet Take 1 tablet by mouth daily. 01/14/21  Yes Elgergawy, Silver Huguenin, MD  simvastatin (ZOCOR) 10 MG tablet Take 10 mg by mouth at bedtime.   Yes [provider]  spironolactone (ALDACTONE) 25 MG tablet Take 0.5 tablets (12.5 mg total) by mouth daily. 04/27/19  Yes Thurnell Lose, MD  torsemide (DEMADEX) 20 MG tablet Take 0.5-1 tablets (10-20 mg total) by mouth See admin instructions. Take 10mg  by mouth daily.  If weight gain of 5lbs or more, then take 20mg  daily for two days.  If weight does not decrease, then see MD. 05/24/21  Yes Fredia Sorrow, MD  Vitamin D, Cholecalciferol, 10 MCG (400 UNIT) CAPS Take 800 Units by mouth daily.   Yes [provider]  losartan (COZAAR) 25 MG tablet Take 1 tablet (25 mg total) by mouth daily. 01/14/21 03/15/21  Elgergawy, Silver Huguenin, MD    Inpatient Medications: Scheduled Meds:  apixaban  5 mg Oral BID   aspirin EC  81 mg Oral Daily   feeding supplement  237 mL Oral BID BM   [START ON 06/14/2021] influenza vaccine adjuvanted  0.5 mL Intramuscular Tomorrow-1000   multivitamin with minerals  1 tablet Oral Daily   [START ON 06/14/2021] pneumococcal 23 valent vaccine  0.5 mL Intramuscular Tomorrow-1000   simvastatin  10 mg Oral QHS   sodium chloride flush  3 mL Intravenous Q12H   Continuous Infusions:  PRN Meds: acetaminophen **OR** acetaminophen  Allergies:   No Known  Allergies  Social History:   Social History   Socioeconomic History   Marital status: Married    Spouse name: Not on file   Number of children: Not on file   Years of education: Not on file   Highest education level: Not on file  Occupational History   Not on file  Tobacco Use   Smoking status: Never   Smokeless tobacco: Never  Vaping Use   Vaping Use: Never used  Substance and Sexual Activity   Alcohol use: Never   Drug use: Never   Sexual activity: Not Currently  Other Topics Concern   Not on file  Social History Narrative   Not on file   Social  Determinants of Health   Financial Resource Strain: Not on file  Food Insecurity: Not on file  Transportation Needs: Not on file  Physical Activity: Not on file  Stress: Not on file  Social Connections: Not on file  Intimate Partner Violence: Not on file    Family History:    Family History  Problem Relation Age of Onset   Heart attack Mother    Heart attack Father      ROS:  Please see the history of present illness.   All other ROS reviewed and negative.     Physical Exam/Data:   Vitals:   06/13/21 0120 06/13/21 0213 06/13/21 0338 06/13/21 0700  BP: 104/77  104/87   Pulse: 67 73 66   Resp: 16 20 20    Temp: 98.1 F (36.7 C)  (!) 97.4 F (36.3 C)   TempSrc: Oral  Oral   SpO2: 100% 100% 98%   Weight:    100.6 kg  Height:        Intake/Output Summary (Last 24 hours) at 06/13/2021 1329 Last data filed at 06/13/2021 0921 Gross per 24 hour  Intake 360 ml  Output 775 ml  Net -415 ml   Last 3 Weights 06/13/2021 06/12/2021 05/24/2021  Weight (lbs) 221 lb 12.5 oz 219 lb 221 lb  Weight (kg) 100.6 kg 99.338 kg 100.245 kg     Body mass index is 33.72 kg/m.  General:  Well nourished, well developed, in no acute distress HEENT: normal Neck: no JVD Vascular: No carotid bruits; Distal pulses 2+ bilaterally Cardiac:  normal S1, S2; RRR; no murmur  Lungs:  clear to auscultation bilaterally, no wheezing, rhonchi  or rales  Abd: soft, nontender, no hepatomegaly  Ext: no edema Musculoskeletal:  No deformities, BUE and BLE strength normal and equal Skin: warm and dry  Neuro:  CNs 2-12 intact, no focal abnormalities noted Psych:  Normal affect   EKG:  The EKG was personally reviewed and demonstrates:  sinus rhythm with heart rate 72 with PVCs, first degree heart bloc, LBBB, old inferior and anterior infarcts seen on previous tracings in our system Telemetry:  Telemetry was personally reviewed and demonstrates:  sinus rhythm with prolonged PR, PVCs, intermittent V pacing  Relevant CV Studies:  Echo 01/13/21: 1. Left ventricular ejection fraction, by estimation, is 20 to 25%. The  left ventricle has severely decreased function. The left ventricle  demonstrates global hypokinesis. The left ventricular internal cavity size  was severely dilated. Left ventricular  diastolic parameters are consistent with Grade III diastolic dysfunction  (restrictive). Elevated left ventricular end-diastolic pressure. The E/e'  is 23.   2. Right ventricular systolic function is mildly reduced. The right  ventricular size is mildly enlarged. There is mildly elevated pulmonary  artery systolic pressure. The estimated right ventricular systolic  pressure is [id number] mmHg.   3. Left atrial size was moderately dilated.   4. The mitral valve is abnormal. Mild to moderate mitral valve  regurgitation.   5. The aortic valve is tricuspid. Aortic valve regurgitation is mild.  Mild aortic valve sclerosis is present, with no evidence of aortic valve  stenosis. Aortic regurgitation PHT measures 472 msec.   6. The inferior vena cava is normal in size with greater than 50%  respiratory variability, suggesting right atrial pressure of 3 mmHg.   Laboratory Data:  High Sensitivity Troponin:   Recent Labs  Lab 05/24/21 1003 05/24/21 1532 06/12/21 1030 06/12/21 1342 06/12/21 1839  TROPONINIHS 22* 21* 22* 24* 21*  Chemistry Recent Labs  Lab 06/12/21 1030 06/12/21 1839 06/13/21 0336  NA 139  --  139  K 3.6  --  3.8  CL 101  --  103  CO2 29  --  27  GLUCOSE 92  --  89  BUN 17  --  18  CREATININE 1.46*  --  1.48*  CALCIUM 9.4  --  9.1  MG  --  2.3  --   GFRNONAA 51*  --  50*  ANIONGAP 9  --  9    Recent Labs  Lab 06/12/21 1030 06/12/21 1839 06/13/21 0336  PROT 6.9 7.3 6.2*  ALBUMIN 3.8 3.7 3.3*  AST 23 27 23   ALT 21 25 12   ALKPHOS 83 85 76  BILITOT 2.8* 3.0* 2.6*   Lipids No results for input(s): CHOL, TRIG, HDL, LABVLDL, LDLCALC, CHOLHDL in the last 168 hours.  Hematology Recent Labs  Lab 06/12/21 1030 06/13/21 0336  WBC 5.8 6.7  RBC 5.90* 5.19  HGB 15.8 14.3  HCT 48.9 43.8  MCV 82.9 84.4  MCH 26.8 27.6  MCHC 32.3 32.6  RDW 16.8* 16.3*  PLT 130* 129*   Thyroid No results for input(s): TSH, FREET4 in the last 168 hours.  BNP Recent Labs  Lab 06/12/21 1030  BNP 1,445.1*    DDimer  Recent Labs  Lab 06/12/21 1030  DDIMER <0.27     Radiology/Studies:  DG Chest Port 1 View  Result Date: 06/12/2021 CLINICAL DATA:  Shortness of breath EXAM: PORTABLE CHEST 1 VIEW COMPARISON:  05/24/2021 FINDINGS: Unchanged position of left chest cardiac device and lead. Unchanged cardiomegaly. Unchanged mediastinal contours. Unchanged vascular congestion without overt pulmonary edema. No focal pulmonary opacity. No definite pleural effusion. No acute osseous abnormality. IMPRESSION: Cardiomegaly with vascular congestion but without overt pulmonary edema. Electronically Signed   By: Merilyn Baba M.D.   On: 06/12/2021 12:03     Assessment and Plan:   Acute on chronic systolic and diastolic heart failure Intermittent hypotension Chronic respiratory failure since COVID 2020 OSA on CPAP intermittently - Aug 2022 LVEF 20-25% with grade III DD, mildly reduced RV function, mild to moderate MR - EKG with evidence of prior inferior and anterior infarcts - he describes "chest cramping"  over the last two weeks - he is fairly sedentary now that he is retired, difficult to ascertain exertional symptoms - elevated BNP with CXR concerning for congestion - he does reports some chest discomfort that sounds atypical - unclear etiology for his heart failure, CE low and flat - he does not appear extremely volume overloaded on exam - he is comfortable, answering questions, and no distress - he received 20 mg IV lasix x 1 yesterday, I&Os incomplete - creatinine appears at baseline and CO2 24 - holding BB, spironolactone, and losartan given marginal BP - consider additional 40 mg IV lasix today and left and right heart catheterization tomorrow - [facility], has not had a recent ischemic evaluation   Syncope - in the setting of coughing and poor PO intake - suspect this is vagally mediated, although telemetry with possible Wenckebach - will interrogate ICD - medtronic ICD - [facility] showed many PVCs, PAT, and SVT - he did have a shock in Dec 2022 for Vfib that did not respond to ATP   CKD stage III - sCr appears at baseline - 1.48 - baseline appears to be 1.3-1.5   PAF Chronic anticoagulation - unclear when this started, suspect it was autodetected  on his device - maintaining sinus rhythm now - if performing heart cath tomorrow, consider transitioning eliquis to heparin this afternoon   Hyperlipidemia - continue home zocor - consider repeating a lipid profile     Risk Assessment/Risk Scores:     HEAR Score (for undifferentiated chest pain):  HEAR Score: 4  New York Heart Association (NYHA) Functional Class NYHA Class II  CHA2DS2-VASc Score = 3   This indicates a 3.2% annual risk of stroke. The patient's score is based upon: CHF History: 1 HTN History: 1 Diabetes History: 0 Stroke History: 0 Vascular Disease History: 0 Age Score: 1 Gender Score: 0      For questions or updates, please contact [place] Please consult  [email or web address] for contact info under    Signed, Ledora Bottcher, PA  06/13/2021 1:29 PM

## 2021-06-14 ENCOUNTER — Encounter (HOSPITAL_COMMUNITY)
Admission: EM | Disposition: A | Discharge status: Home Health | Payer: Self-pay | Source: Home / Self Care | Attending: Internal Medicine

## 2021-06-14 DIAGNOSIS — I2510 Atherosclerotic heart disease of native coronary artery without angina pectoris: Secondary | ICD-10-CM | POA: Diagnosis not present

## 2021-06-14 DIAGNOSIS — N289 Disorder of kidney and ureter, unspecified: Secondary | ICD-10-CM | POA: Diagnosis not present

## 2021-06-14 DIAGNOSIS — I951 Orthostatic hypotension: Secondary | ICD-10-CM | POA: Diagnosis not present

## 2021-06-14 DIAGNOSIS — Z95810 Presence of automatic (implantable) cardiac defibrillator: Secondary | ICD-10-CM | POA: Diagnosis not present

## 2021-06-14 DIAGNOSIS — I5023 Acute on chronic systolic (congestive) heart failure: Secondary | ICD-10-CM | POA: Diagnosis not present

## 2021-06-14 HISTORY — PX: RIGHT/LEFT HEART CATH AND CORONARY ANGIOGRAPHY: CATH118266

## 2021-06-14 LAB — CBC
HCT: 44.4 % (ref 39.0–52.0)
HCT: 50.1 % (ref 39.0–52.0)
Hemoglobin: 14.2 g/dL (ref 13.0–17.0)
Hemoglobin: 16.2 g/dL (ref 13.0–17.0)
MCH: 26.9 pg (ref 26.0–34.0)
MCH: 27.1 pg (ref 26.0–34.0)
MCHC: 32 g/dL (ref 30.0–36.0)
MCHC: 32.3 g/dL (ref 30.0–36.0)
MCV: 84.1 fL (ref 80.0–100.0)
MCV: 83.9 fL (ref 80.0–100.0)
Platelets: 115 10*3/uL — ABNORMAL LOW (ref 150–400)
Platelets: UNDETERMINED 10*3/uL (ref 150–400)
RBC: 5.28 MIL/uL (ref 4.22–5.81)
RBC: 5.97 MIL/uL — ABNORMAL HIGH (ref 4.22–5.81)
RDW: 16.2 % — ABNORMAL HIGH (ref 11.5–15.5)
RDW: 16.4 % — ABNORMAL HIGH (ref 11.5–15.5)
WBC: 7.2 10*3/uL (ref 4.0–10.5)
WBC: 7.2 10*3/uL (ref 4.0–10.5)
nRBC: 0 % (ref 0.0–0.2)
nRBC: 0 % (ref 0.0–0.2)

## 2021-06-14 LAB — RENAL FUNCTION PANEL
Albumin: 3 g/dL — ABNORMAL LOW (ref 3.5–5.0)
Anion gap: 5 (ref 5–15)
BUN: 16 mg/dL (ref 8–23)
CO2: 29 mmol/L (ref 22–32)
Calcium: 8.4 mg/dL — ABNORMAL LOW (ref 8.9–10.3)
Chloride: 104 mmol/L (ref 98–111)
Creatinine, Ser: 1.48 mg/dL — ABNORMAL HIGH (ref 0.61–1.24)
GFR, Estimated: 50 mL/min — ABNORMAL LOW (ref >60)
Glucose, Bld: 87 mg/dL (ref 70–99)
Phosphorus: 3.7 mg/dL (ref 2.5–4.6)
Potassium: 3.4 mmol/L — ABNORMAL LOW (ref 3.5–5.1)
Sodium: 138 mmol/L (ref 135–145)

## 2021-06-14 LAB — BASIC METABOLIC PANEL
Anion gap: 6 (ref 5–15)
BUN: 15 mg/dL (ref 8–23)
CO2: 29 mmol/L (ref 22–32)
Calcium: 8.8 mg/dL — ABNORMAL LOW (ref 8.9–10.3)
Chloride: 102 mmol/L (ref 98–111)
Creatinine, Ser: 1.53 mg/dL — ABNORMAL HIGH (ref 0.61–1.24)
GFR, Estimated: 48 mL/min — ABNORMAL LOW (ref >60)
Glucose, Bld: 92 mg/dL (ref 70–99)
Potassium: 3.5 mmol/L (ref 3.5–5.1)
Sodium: 137 mmol/L (ref 135–145)

## 2021-06-14 LAB — TSH: TSH: 3.168 u[IU]/mL (ref 0.350–4.500)

## 2021-06-14 LAB — POCT I-STAT EG7
Acid-Base Excess: 5 mmol/L — ABNORMAL HIGH (ref 0.0–2.0)
Bicarbonate: 31.3 mmol/L — ABNORMAL HIGH (ref 20.0–28.0)
Calcium, Ion: 1.26 mmol/L (ref 1.15–1.40)
HCT: 50 % (ref 39.0–52.0)
Hemoglobin: 17 g/dL (ref 13.0–17.0)
O2 Saturation: 58 %
Potassium: 3.5 mmol/L (ref 3.5–5.1)
Sodium: 142 mmol/L (ref 135–145)
TCO2: 33 mmol/L — ABNORMAL HIGH (ref 22–32)
pCO2, Ven: 52.2 mmHg (ref 44.0–60.0)
pH, Ven: 7.386 (ref 7.250–7.430)
pO2, Ven: 32 mmHg (ref 32.0–45.0)

## 2021-06-14 LAB — POCT I-STAT 7, (LYTES, BLD GAS, ICA,H+H)
Acid-Base Excess: 4 mmol/L — ABNORMAL HIGH (ref 0.0–2.0)
Bicarbonate: 31.1 mmol/L — ABNORMAL HIGH (ref 20.0–28.0)
Calcium, Ion: 1.2 mmol/L (ref 1.15–1.40)
HCT: 45 % (ref 39.0–52.0)
Hemoglobin: 15.3 g/dL (ref 13.0–17.0)
O2 Saturation: 92 %
Potassium: 3.5 mmol/L (ref 3.5–5.1)
Sodium: 125 mmol/L — ABNORMAL LOW (ref 135–145)
TCO2: 33 mmol/L — ABNORMAL HIGH (ref 22–32)
pCO2 arterial: 54 mmHg — ABNORMAL HIGH (ref 32.0–48.0)
pH, Arterial: 7.368 (ref 7.350–7.450)
pO2, Arterial: 67 mmHg — ABNORMAL LOW (ref 83.0–108.0)

## 2021-06-14 LAB — HEPARIN LEVEL (UNFRACTIONATED)
Heparin Unfractionated: >1.1 IU/mL — ABNORMAL HIGH (ref 0.30–0.70)
Heparin Unfractionated: >1.1 IU/mL — ABNORMAL HIGH (ref 0.30–0.70)

## 2021-06-14 LAB — APTT
aPTT: 133 seconds — ABNORMAL HIGH (ref 24–36)
aPTT: 113 seconds — ABNORMAL HIGH (ref 24–36)

## 2021-06-14 LAB — MAGNESIUM: Magnesium: 2.2 mg/dL (ref 1.7–2.4)

## 2021-06-14 LAB — CREATININE, SERUM
Creatinine, Ser: 1.43 mg/dL — ABNORMAL HIGH (ref 0.61–1.24)
GFR, Estimated: 52 mL/min — ABNORMAL LOW (ref >60)

## 2021-06-14 SURGERY — RIGHT/LEFT HEART CATH AND CORONARY ANGIOGRAPHY
Anesthesia: LOCAL

## 2021-06-14 MED ORDER — MIDAZOLAM HCL 2 MG/2ML IJ SOLN
INTRAMUSCULAR | Status: AC
  Filled 2021-06-14: qty 2

## 2021-06-14 MED ORDER — FENTANYL CITRATE (PF) 100 MCG/2ML IJ SOLN
INTRAMUSCULAR | Status: AC
  Filled 2021-06-14: qty 2

## 2021-06-14 MED ORDER — ONDANSETRON HCL 4 MG/2ML IJ SOLN: 4.0000 mg | Freq: Four times a day (QID) | INTRAMUSCULAR | Status: DC | PRN

## 2021-06-14 MED ORDER — HYDRALAZINE HCL 20 MG/ML IJ SOLN: 10.0000 mg | INTRAMUSCULAR | Status: AC | PRN

## 2021-06-14 MED ORDER — IOHEXOL 350 MG/ML SOLN
INTRAVENOUS | Status: DC | PRN
  Administered 2021-06-14: 35 mL

## 2021-06-14 MED ORDER — HEPARIN (PORCINE) IN NACL 1000-0.9 UT/500ML-% IV SOLN
INTRAVENOUS | Status: AC
  Filled 2021-06-14: qty 1000

## 2021-06-14 MED ORDER — HEPARIN (PORCINE) IN NACL 1000-0.9 UT/500ML-% IV SOLN
INTRAVENOUS | Status: DC | PRN
  Administered 2021-06-14 (×2): 500 mL

## 2021-06-14 MED ORDER — HEPARIN SODIUM (PORCINE) 1000 UNIT/ML IJ SOLN
INTRAMUSCULAR | Status: AC
  Filled 2021-06-14: qty 10

## 2021-06-14 MED ORDER — SODIUM CHLORIDE 0.9 % IV SOLN: 250.0000 mL | INTRAVENOUS | Status: DC | PRN

## 2021-06-14 MED ORDER — SODIUM CHLORIDE 0.9% FLUSH: 3.0000 mL | INTRAVENOUS | Status: DC | PRN

## 2021-06-14 MED ORDER — MIDAZOLAM HCL 2 MG/2ML IJ SOLN
INTRAMUSCULAR | Status: DC | PRN
  Administered 2021-06-14: 1 mg via INTRAVENOUS

## 2021-06-14 MED ORDER — HEPARIN SODIUM (PORCINE) 5000 UNIT/ML IJ SOLN
5000.0000 [IU] | Freq: Three times a day (TID) | INTRAMUSCULAR | Status: DC
  Administered 2021-06-15: 5000 [IU] via SUBCUTANEOUS
  Filled 2021-06-14: qty 1

## 2021-06-14 MED ORDER — LIDOCAINE HCL (PF) 1 % IJ SOLN
INTRAMUSCULAR | Status: AC
  Filled 2021-06-14: qty 30

## 2021-06-14 MED ORDER — SODIUM CHLORIDE 0.9% FLUSH
3.0000 mL | Freq: Two times a day (BID) | INTRAVENOUS | Status: DC
  Administered 2021-06-14 – 2021-06-17 (×5): 3 mL via INTRAVENOUS

## 2021-06-14 MED ORDER — LABETALOL HCL 5 MG/ML IV SOLN: 10.0000 mg | INTRAVENOUS | Status: AC | PRN

## 2021-06-14 MED ORDER — VERAPAMIL HCL 2.5 MG/ML IV SOLN
INTRAVENOUS | Status: DC | PRN
  Administered 2021-06-14: 10 mL via INTRA_ARTERIAL

## 2021-06-14 MED ORDER — VERAPAMIL HCL 2.5 MG/ML IV SOLN
INTRAVENOUS | Status: AC
  Filled 2021-06-14: qty 2

## 2021-06-14 MED ORDER — HEPARIN SODIUM (PORCINE) 1000 UNIT/ML IJ SOLN
INTRAMUSCULAR | Status: DC | PRN
  Administered 2021-06-14: 5000 [IU] via INTRAVENOUS

## 2021-06-14 MED ORDER — LIDOCAINE HCL (PF) 1 % IJ SOLN
INTRAMUSCULAR | Status: DC | PRN
  Administered 2021-06-14: 5 mL

## 2021-06-14 MED ORDER — FENTANYL CITRATE (PF) 100 MCG/2ML IJ SOLN
INTRAMUSCULAR | Status: DC | PRN
  Administered 2021-06-14: 25 ug via INTRAVENOUS

## 2021-06-14 SURGICAL SUPPLY — 12 items

## 2021-06-14 NOTE — Progress Notes (Signed)
ANTICOAGULATION CONSULT NOTE Pharmacy Consult for Heparin  Indication: atrial fibrillation  No Known Allergies  Patient Measurements: Height: 5\' 8"  (172.7 cm) Weight: 98.9 kg (218 lb 0.6 oz) IBW/kg (Calculated) : 68.4 Heparin Dosing Weight: 89.7 kg  Vital Signs: Temp: 97.7 F (36.5 C) (01/13 1132) Temp Source: Oral (01/13 1132) BP: 91/80 (01/13 1132) Pulse Rate: 69 (01/13 1132)  Labs: Recent Labs    06/12/21 1030 06/12/21 1342 06/12/21 1839 06/13/21 0336 06/14/21 0048 06/14/21 0209 06/14/21 0447 06/14/21 1114  HGB 15.8  --   --  14.3  --  14.2  --   --   HCT 48.9  --   --  43.8  --  44.4  --   --   PLT 130*  --   --  129*  --  115*  --   --   APTT  --   --   --   --  133*  --   --  113*  HEPARINUNFRC  --   --   --   --  >1.10*  --   --  >1.10*  CREATININE 1.46*  --   --  1.48*  --  1.48* 1.53*  --   TROPONINIHS 22* 24* 21*  --   --   --   --   --      Estimated Creatinine Clearance: 50.5 mL/min (A) (by C-G formula based on SCr of 1.53 mg/dL (H)).  Assessment: 71 y.o. male with h/o Afib, Eliquis on hold,  now on IV heparin infusion.  Monitoring by aPTTs as heparin levels are elevated due to Eliquis .  Now the aPTT is 113 seconds after heparin rate reduced to 1200 units/hr.   PTT down from previous value, however still supra-therapeutic.  Will decrease heparin rate again.   No bleeding noted.   Pltc 115k,  H/H within normal/ stable.    Goal of Therapy:  Heparin level 0.3-0.7 units/ml Heparin level 66-102 units/ml Monitor platelets by anticoagulation protocol: Yes   Plan:  Decrease Heparin 1050 units/hr Follow up after cath today    Thank you for allowing pharmacy to be part of this patients care team. Nicole Cella, [place] Pharmacist [phone] 06/14/2021,1:17 PM .rpxho

## 2021-06-14 NOTE — Progress Notes (Signed)
Pt has refused CPAP for bed tonight. Pt is currently resting on [place]. RT will cont to monitor.

## 2021-06-14 NOTE — TOC Progression Note (Signed)
Transition of Care [facility]) - Progression Note    Patient Details  Name: Lekendrick Alpern MRN: [id number] Date of Birth: 1951-02-26  Transition of Care [facility]) CM/SW Contact  Leone Haven, RN Phone Number: 06/14/2021, 10:35 AM  Clinical Narrative:     Transition of Care [facility]) Screening Note   Patient Details  Name: Race Latour Date of Birth: 02-May-1951   Transition of Care [facility]) CM/SW Contact:    Leone Haven, RN Phone Number: 06/14/2021, 10:35 AM    Transition of Care Department [facility]) has reviewed patient and no TOC needs have been identified at this time. We will continue to monitor patient advancement through interdisciplinary progression rounds. If new patient transition needs arise, please place a TOC consult.          Expected Discharge Plan and Services                                                 Social Determinants of Health (SDOH) Interventions    Readmission Risk Interventions No flowsheet data found.

## 2021-06-14 NOTE — Progress Notes (Signed)
ANTICOAGULATION CONSULT NOTE Pharmacy Consult for Heparin  Indication: atrial fibrillation Brief A/P: aPTT subtherapeutic  Decrease Heparin rate  No Known Allergies  Patient Measurements: Height: 5\' 8"  (172.7 cm) Weight: 98.9 kg (218 lb 0.6 oz) IBW/kg (Calculated) : 68.4 Heparin Dosing Weight: 89.7 kg  Vital Signs: Temp: 98.4 F (36.9 C) (01/13 0029) Temp Source: Oral (01/13 0029) BP: 112/69 (01/13 0029) Pulse Rate: 75 (01/13 0029)  Labs: Recent Labs    06/12/21 1030 06/12/21 1342 06/12/21 1839 06/13/21 0336 06/14/21 0048  HGB 15.8  --   --  14.3  --   HCT 48.9  --   --  43.8  --   PLT 130*  --   --  129*  --   APTT  --   --   --   --  133*  HEPARINUNFRC  --   --   --   --  >1.10*  CREATININE 1.46*  --   --  1.48*  --   TROPONINIHS 22* 24* 21*  --   --      Estimated Creatinine Clearance: 52.2 mL/min (A) (by C-G formula based on SCr of 1.48 mg/dL (H)).  Assessment: 71 y.o. male with h/o Afib, Eliquis on hold, for heparin  Goal of Therapy:  Heparin level 0.3-0.7 units/ml Heparin level 66-102 units/ml Monitor platelets by anticoagulation protocol: Yes   Plan:  Decrease Heparin 1200 units/hr Follow up after cath today   62, PharmD, BCPS  06/14/2021,1:32 AM

## 2021-06-14 NOTE — TOC Progression Note (Addendum)
Transition of Care [facility]) - Progression Note    Patient Details  Name: Evan Serrano MRN: [id number] Date of Birth: 1951-01-25  Transition of Care [facility]) CM/SW Contact  Leone Haven, RN Phone Number: 06/14/2021, 2:54 PM  Clinical Narrative:    He is from home with his wife, he states she has notified the [place] that he is here in the hospital. He has home oxygen with [employer] 1.5 liters prn, he also has a cpap machine and a heart monitor.  He conts on iv lasix and for heart cath today.  He states he weighs himself everyother day, he checks his bp but not everyday, he states he does eat a low sodium diet and his wife helps him with this.  TOC will continue to follow for dc needs.  NCM offered choice, for HHPT, HHOT, he states he does not have a preference, NCM made referral to [facility] with [facility], she is able to take referral with soc 24 to 48 hrs post dc.  His PCP at the [facility] is Freada Bergeron , fax number is [phone].  Orders faxed to PCP office.  CSW is [facility] [phone] ext [phone]. Elnita Maxwell with [facility] will call to get authorization for [facility] services.        Expected Discharge Plan and Services                                                 Social Determinants of Health (SDOH) Interventions    Readmission Risk Interventions No flowsheet data found.

## 2021-06-14 NOTE — Progress Notes (Signed)
Physical Therapy Evaluation Patient Details Name: Evan Serrano MRN: [id number] DOB: 09-14-1950 Today's Date: 06/14/2021  History of Present Illness  Pt is a 71 y.o. male who presented 06/12/21 to ED with SOB, transferred to [facility] 1/13. Pt with 2 syncopal episodes secondary to coughing. Pt admitted with CHF exacerbation. 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 PTA, he was mod I holding onto the walls for support, intermittently using his SPC or RW when feeling weak. He lives with his wife in a 1-level apartment with 15 STE. Pt uses 1.5 L/min of supplemental O2 at baseline. Currently, pt displays generalized weakness and deficits in activity tolerance and balance. He was able to mobilize around the room with a RW at a min guard assist level without LOB today. He would benefit from follow-up HHPT to maximize his independence and safety with all functional mobility and reduce his risk for falls. Will continue to follow acutely.   BP:  97/76 supine 109/84 sitting 105/86 standing 102/82 sitting after ambulating    Recommendations for follow up therapy are one component of a multi-disciplinary discharge planning process, led by the attending physician.  Recommendations may be updated based on patient status, additional functional criteria and insurance authorization.  Follow Up Recommendations Home health PT    Assistance Recommended at Discharge PRN  Patient can return home with the following  Help with stairs or ramp for entrance;A little help with bathing/dressing/bathroom    Equipment Recommendations Other (comment) (shower chair)  Recommendations for Other Services       Functional Status Assessment Patient has had a recent decline in their functional status and demonstrates the ability to make significant improvements in function in a reasonable and predictable amount of  time.     Precautions / Restrictions Precautions Precautions: Fall Precaution Comments: 1.5L O2 baseline Restrictions Weight Bearing Restrictions: No      Mobility  Bed Mobility Overal bed mobility: Needs Assistance Bed Mobility: Supine to Sit;Sit to Supine     Supine to sit: Supervision;HOB elevated Sit to supine: Supervision   General bed mobility comments: sup for safety, no physical assist required    Transfers Overall transfer level: Needs assistance Equipment used: Rolling walker (2 wheels) Transfers: Sit to/from Stand;Bed to chair/wheelchair/BSC Sit to Stand: Min guard   Step pivot transfers: Min guard       General transfer comment: min guard A for safety    Ambulation/Gait Ambulation/Gait assistance: Min guard Gait Distance (Feet): 40 Feet Assistive device: Rolling walker (2 wheels) Gait Pattern/deviations: Step-through pattern;Decreased stride length Gait velocity: reduced Gait velocity interpretation: <1.8 ft/sec, indicate of risk for recurrent falls   General Gait Details: Pt with slow, mostly steady gait with RW within room, min guard for safety.  Stairs            Wheelchair Mobility    Modified Rankin (Stroke Patients Only)       Balance Overall balance assessment: Mild deficits observed, not formally tested Sitting-balance support: No upper extremity supported;Feet supported Sitting balance-Leahy Scale: Good     Standing balance support: Reliant on assistive device for balance Standing balance-Leahy Scale: Poor Standing balance comment: Reliant on RW                             Pertinent Vitals/Pain Pain Assessment: No/denies pain    Home Living  Family/patient expects to be discharged to:: Private residence Living Arrangements: Spouse/significant other Available Help at Discharge: Family Type of Home: Apartment Home Access: Stairs to enter Entrance Stairs-Rails: Right Entrance Stairs-Number of Steps: [place]: One level [place]: Conservation officer, nature (2 wheels);Cane - single point;BSC/3in1 Additional Comments: Wears 1.5 L of supplemental O2 during day and wears CPAP at night    Prior Function Prior Level of Function : Independent/Modified Independent             Mobility Comments: Normally holds onto walls for support, but intermittently uses SPC when weak and the RW when very weak. Denies any falls in past 6 months. ADLs Comments: Ind with bathing, dressimg, grooming anf toileting. Uses arm rests of bedside commode in shower for UE support while propping one foot up on it at a time to wash. Does not like to transfer bedside commode back out of shower due to constantly having to wipe it down.     Hand Dominance   Dominant Hand: Right    Extremity/Trunk Assessment   Upper Extremity Assessment Upper Extremity Assessment: Generalized weakness    Lower Extremity Assessment Lower Extremity Assessment: Defer to PT evaluation    Cervical / Trunk Assessment Cervical / Trunk Assessment: Normal  Communication   Communication: No difficulties  Cognition Arousal/Alertness: Awake/alert Behavior During Therapy: WFL for tasks assessed/performed Overall Cognitive Status: Within Functional Limits for tasks assessed                                 General Comments: very friendly,  talkative        General Comments General comments (skin integrity, edema, etc.): SpO2 stable on 1.5L when pulse ox would read; BP 97/76 supine, 109/84 sitting, 105/86 standing, 102/82 sitting after ambulating    Exercises     Assessment/Plan    PT Assessment Patient needs continued PT services  PT Problem List Decreased strength;Decreased activity tolerance;Decreased balance;Decreased mobility;Cardiopulmonary status limiting activity       PT Treatment Interventions DME instruction;Gait training;Stair training;Functional mobility training;Therapeutic activities;Therapeutic  exercise;Balance training;Neuromuscular re-education;Patient/family education    PT Goals (Current goals can be found in the Care Plan section)  Acute Rehab PT Goals Patient Stated Goal: to improve PT Goal Formulation: With patient Time For Goal Achievement: 06/28/21 Potential to Achieve Goals: Good    Frequency Min 3X/week     Co-evaluation               AM-PAC PT "6 Clicks" Mobility  Outcome Measure Help needed turning from your back to your side while in a flat bed without using bedrails?: A Little Help needed moving from lying on your back to sitting on the side of a flat bed without using bedrails?: A Little Help needed moving to and from a bed to a chair (including a wheelchair)?: A Little Help needed standing up from a chair using your arms (e.g., wheelchair or bedside chair)?: A Little Help needed to walk in hospital room?: A Little Help needed climbing 3-5 steps with a railing? : A Little 6 Click Score: 18    End of Session Equipment Utilized During Treatment: Gait belt Activity Tolerance: Patient tolerated treatment well Patient left: in bed;with call bell/phone within reach;with bed alarm set Nurse Communication: Mobility status PT Visit Diagnosis: Unsteadiness on feet (R26.81);Other abnormalities of gait and mobility (R26.89);Muscle weakness (generalized) (M62.81);Difficulty in walking, not elsewhere classified (R26.2)  Time: 1201-1226 PT Time Calculation (min) (ACUTE ONLY): 25 min   Charges:   PT Evaluation $PT Eval Moderate Complexity: 1 Mod PT Treatments $Therapeutic Activity: 8-22 mins        Moishe Spice, PT, DPT Acute Rehabilitation Services  Pager: [phone] Office: [phone]   Orvan Falconer 06/14/2021, 1:53 PM

## 2021-06-14 NOTE — Progress Notes (Signed)
[facility] arrived to transfer pt to [facility].   Pt alert and oriented x4. Mildly anxious about plan of care.   Pt has expressed need for life alert and medical bracelet if possible during this hospital encounter

## 2021-06-14 NOTE — Progress Notes (Signed)
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  BNP elevated to 1400.  CXR consistent with CHF.  Received IV Lasix 20 mg in ED and became hypotensive with systolic in [id number] and 0000000.  Cardiology consulted and recommended transfer to [facility] for further care and evaluation.  Assessment & Plan:   Principal Problem:   CHF exacerbation ([place]) Active Problems:   Orthostatic hypotension   Renal insufficiency   Implantable cardioverter-defibrillator (ICD) in situ   Hyperlipidemia   Obstructive sleep apnea syndrome     Acute on Chronic respiratory failure on 2 lit/min.  - secondary to  acute on chronic systolic and  diastolic heart failure.  She appears to be volume overloaded. - CXR showing Cardiomegaly with vascular congestion but without overt pulmonary edema. - Echocardiogram showed Left ventricular ejection fraction is 20 to 25%,  has severely decreased function. The left ventricle demonstrates global hypokinesis. The left ventricular internal cavity size was severely dilated. Left ventricular  diastolic parameters are consistent with Grade III diastolic dysfunction (restrictive).  -IV lasix 80 mg BID.  - continue with strict intake and output, daily weights and monitor renal parameters while on IV Lasix.  PAF:  - rate controlled.  - on IV heparin.    Nonischemic cardiomyopathy S/p ICD for VF Continue to monitor.  Obstructive sleep apnea on CPAP  Hyperlipidemia:  Zocor.    Elevated troponins   Demand ischemia from CHF.    Hypokalemia  Replaced.    Stage 3b CKD:  Creatinine stable.   Essential hypertension Blood pressure parameters appear to be on the softer side. Continue to monitor.  DVT prophylaxis: Heparin.  Code Status: full code.  Family Communication: none at bedside.  Disposition:   Status is: Inpatient  Remains inpatient appropriate because: work up.        Consultants:  Cardiology.   Procedures: Echo L/R CATH.  Antimicrobials: None.   Subjective: Breathing has improved.   Objective: Vitals:   06/13/21 2200 06/14/21 0029 06/14/21 0453 06/14/21 0751  BP:  112/69 94/69 99/80   Pulse:  75 70 71  Resp:  20 17 20   Temp:  98.4 F (36.9 C) 97.9 F (36.6 C) 98.4 F (36.9 C)  TempSrc:  Oral Oral Oral  SpO2:  99% 92% 99%  Weight: 99.3 kg 98.9 kg    Height:  5\' 8"  (1.727 m)      Intake/Output Summary (Last 24 hours) at 06/14/2021 0917 Last data filed at 06/14/2021 0600 Gross per 24 hour  Intake 1563.78 ml  Output 3200 ml  Net -1636.22 ml   Filed Weights   06/13/21 0700 06/13/21 2200 06/14/21 0029  Weight: 100.6 kg 99.3 kg 98.9 kg    Examination:  General exam: Appears calm and comfortable  Respiratory system: Clear to auscultation. Respiratory effort normal. Cardiovascular system: S1 & S2 heard, RRR. No JVD,  2 + leg edema Gastrointestinal system: Abdomen is nondistended, soft and nontender. Normal bowel sounds heard. Central nervous system: Alert and oriented. No focal neurological  deficits. Extremities: Symmetric 5 x 5 power. Skin: No rashes, lesions or ulcers Psychiatry: Mood & affect appropriate.     Data Reviewed: I have personally reviewed following labs and imaging studies  CBC: Recent Labs  Lab 06/12/21 1030 06/13/21 0336 06/14/21 0209  WBC 5.8 6.7 7.2  HGB 15.8 14.3 14.2  HCT 48.9 43.8 44.4  MCV 82.9 84.4 84.1  PLT 130* 129* 115*    Basic Metabolic Panel: Recent Labs  Lab 06/12/21 1030 06/12/21 1839  06/13/21 0336 06/14/21 0209 06/14/21 0447  NA 139  --  139 138 137  K 3.6  --  3.8 3.4* 3.5  CL 101  --  103 104 102  CO2 29  --  27 29 29   GLUCOSE 92  --  89 87 92  BUN 17  --  18 16 15   CREATININE 1.46*  --  1.48* 1.48* 1.53*  CALCIUM 9.4  --  9.1 8.4* 8.8*  MG  --  2.3  --  2.2  --   PHOS  --   --   --  3.7  --     GFR: Estimated Creatinine Clearance: 50.5 mL/min (A) (by C-G formula based on SCr of 1.53 mg/dL (H)).  Liver Function Tests: Recent Labs  Lab 06/12/21 1030 06/12/21 1839 06/13/21 0336 06/14/21 0209  AST 23 27 23   --   ALT 21 25 12   --   ALKPHOS 83 85 76  --   BILITOT 2.8* 3.0* 2.6*  --   PROT 6.9 7.3 6.2*  --   ALBUMIN 3.8 3.7 3.3* 3.0*    CBG: Recent Labs  Lab 06/12/21 1037  GLUCAP 99     Recent Results (from the past 240 hour(s))  Resp Panel by RT-PCR (Flu A&B, Covid) Nasopharyngeal Swab     Status: None   Collection Time: 06/12/21 11:58 AM   Specimen: Nasopharyngeal Swab; Nasopharyngeal(NP) swabs in vial transport medium  Result Value Ref Range Status   SARS Coronavirus 2 by RT PCR NEGATIVE NEGATIVE Final    Comment: (NOTE) SARS-CoV-2 target nucleic acids are NOT DETECTED.  The SARS-CoV-2 RNA is generally detectable in upper respiratory specimens during the acute phase of infection. The lowest concentration of SARS-CoV-2 viral copies this assay can detect is 138 copies/mL. A negative result does not preclude SARS-Cov-2 infection and should not be used as the sole basis for treatment or other patient management decisions. A negative result may occur with  improper specimen collection/handling, submission of specimen other than nasopharyngeal swab, presence of viral mutation(s) within the areas targeted by this assay, and inadequate number of viral copies(<138 copies/mL). A negative result must be combined with clinical observations, patient history, and epidemiological information. The expected result is Negative.  Fact Sheet for  Patients:  EntrepreneurPulse.com.au  Fact Sheet for Healthcare Providers:  IncredibleEmployment.be  This test is no t yet approved or cleared by the [place] FDA and  has been authorized for detection and/or diagnosis of SARS-CoV-2 by FDA under an Emergency Use Authorization (EUA). This EUA will remain  in effect (meaning this test can be used) for the duration of the COVID-19 declaration under Section 564(b)(1) of the Act, 21 U.S.C.section 360bbb-3(b)(1), unless the authorization is terminated  or revoked sooner.       Influenza A by PCR NEGATIVE NEGATIVE Final   Influenza B by PCR NEGATIVE NEGATIVE Final    Comment: (NOTE) The Xpert Xpress SARS-CoV-2/FLU/RSV plus assay is intended as an aid in the diagnosis of influenza  from Nasopharyngeal swab specimens and should not be used as a sole basis for treatment. Nasal washings and aspirates are unacceptable for Xpert Xpress SARS-CoV-2/FLU/RSV testing.  Fact Sheet for Patients: EntrepreneurPulse.com.au  Fact Sheet for Healthcare Providers: IncredibleEmployment.be  This test is not yet approved or cleared by the [place] FDA and has been authorized for detection and/or diagnosis of SARS-CoV-2 by FDA under an Emergency Use Authorization (EUA). This EUA will remain in effect (meaning this test can be used) for the duration of the COVID-19 declaration under Section 564(b)(1) of the Act, 21 U.S.C. section 360bbb-3(b)(1), unless the authorization is terminated or revoked.  Performed at [facility], [address] [place], [place] [address]          Radiology Studies: DG Chest [facility] 1 View  Result Date: 06/12/2021 CLINICAL DATA:  Shortness of breath EXAM: PORTABLE CHEST 1 VIEW COMPARISON:  05/24/2021 FINDINGS: Unchanged position of left chest cardiac device and lead. Unchanged cardiomegaly. Unchanged mediastinal contours.  Unchanged vascular congestion without overt pulmonary edema. No focal pulmonary opacity. No definite pleural effusion. No acute osseous abnormality. IMPRESSION: Cardiomegaly with vascular congestion but without overt pulmonary edema. Electronically Signed   By: Merilyn Baba M.D.   On: 06/12/2021 12:03   ECHOCARDIOGRAM COMPLETE  Result Date: 06/13/2021    ECHOCARDIOGRAM REPORT   Patient Name:   EATHEL SLONAKER Date of Exam: 06/13/2021 Medical Rec #:  [id number]        Height:       68.0 in Accession #:    [id number]       Weight:       221.8 lb Date of Birth:  1951-01-05       BSA:          2.136 m Patient Age:    32 years         BP:           104/87 mmHg Patient Gender: M                HR:           72 bpm. Exam Location:  Inpatient Procedure: 2D Echo, Cardiac Doppler and Color Doppler Indications:    CHF  History:        Patient has prior history of Echocardiogram examinations, most                 recent 01/13/2021. Arrythmias:Atrial Fibrillation,                 Signs/Symptoms:Syncope and Dyspnea; Risk Factors:Hypertension.                 VTACH/ pacer device.  Sonographer:    Beryle Beams Referring Phys: [id number] [place]  1. Left ventricular ejection fraction, by estimation, is 20 to 25%. The left ventricle has severely decreased function. The left ventricle demonstrates global hypokinesis. The left ventricular internal cavity size was severely dilated. Left ventricular diastolic parameters are consistent with Grade III diastolic dysfunction (restrictive). Elevated left ventricular end-diastolic pressure.  2. Right ventricular systolic function is low normal. The right ventricular size is normal. There is moderately elevated pulmonary artery systolic pressure. The estimated right ventricular systolic pressure is 123456 mmHg.  3. Left atrial size was severely dilated.  4. Right atrial size was severely dilated.  5. The mitral valve is abnormal. Mild to moderate mitral valve  regurgitation.  6. The tricuspid valve is abnormal. Tricuspid valve regurgitation is moderate.  7. The aortic valve  is tricuspid. Aortic valve regurgitation is mild. Aortic valve sclerosis/calcification is present, without any evidence of aortic stenosis.  8. Aortic dilatation noted. There is borderline dilatation of the ascending aorta, measuring 39 mm.  9. The inferior vena cava is dilated in size with <50% respiratory variability, suggesting right atrial pressure of 15 mmHg. Comparison(s): Changes from prior study are noted. 01/13/2021: LVEF 20-25%, global HK, grade 3 DD, elevated LVEDP. FINDINGS  Left Ventricle: Left ventricular ejection fraction, by estimation, is 20 to 25%. The left ventricle has severely decreased function. The left ventricle demonstrates global hypokinesis. The left ventricular internal cavity size was severely dilated. There is no left ventricular hypertrophy. Left ventricular diastolic parameters are consistent with Grade III diastolic dysfunction (restrictive). Elevated left ventricular end-diastolic pressure. Right Ventricle: The right ventricular size is normal. No increase in right ventricular wall thickness. Right ventricular systolic function is low normal. There is moderately elevated pulmonary artery systolic pressure. The tricuspid regurgitant velocity  is 3.16 m/s, and with an assumed right atrial pressure of 15 mmHg, the estimated right ventricular systolic pressure is 123456 mmHg. Left Atrium: Left atrial size was severely dilated. Right Atrium: Right atrial size was severely dilated. Pericardium: There is no evidence of pericardial effusion. Mitral Valve: The mitral valve is abnormal. There is mild thickening of the anterior and posterior mitral valve leaflet(s). Mild to moderate mitral valve regurgitation. MV peak gradient, 35.3 mmHg. The mean mitral valve gradient is 28.0 mmHg. Tricuspid Valve: The tricuspid valve is abnormal. Tricuspid valve regurgitation is moderate. Aortic  Valve: The aortic valve is tricuspid. Aortic valve regurgitation is mild. Aortic regurgitation PHT measures 825 msec. Aortic valve sclerosis/calcification is present, without any evidence of aortic stenosis. Aortic valve mean gradient measures 2.0  mmHg. Aortic valve peak gradient measures 3.7 mmHg. Aortic valve area, by VTI measures 2.04 cm. Pulmonic Valve: The pulmonic valve was grossly normal. Pulmonic valve regurgitation is trivial. Aorta: Aortic dilatation noted. There is borderline dilatation of the ascending aorta, measuring 39 mm. Venous: The inferior vena cava is dilated in size with less than 50% respiratory variability, suggesting right atrial pressure of 15 mmHg. IAS/Shunts: No atrial level shunt detected by color flow Doppler. Additional Comments: A device lead is visualized.  LEFT VENTRICLE PLAX 2D LVIDd:         7.00 cm      Diastology LVIDs:         6.50 cm      LV e' medial:    6.31 cm/s LV PW:         1.00 cm      LV E/e' medial:  14.1 LV IVS:        0.90 cm      LV e' lateral:   7.51 cm/s LVOT diam:     1.80 cm      LV E/e' lateral: 11.9 LV SV:         36 LV SV Index:   17 LVOT Area:     2.54 cm  LV Volumes (MOD) LV vol d, MOD A2C: 203.0 ml LV vol d, MOD A4C: 204.0 ml LV vol s, MOD A2C: 151.0 ml LV vol s, MOD A4C: 136.0 ml LV SV MOD A2C:     52.0 ml LV SV MOD A4C:     204.0 ml LV SV MOD BP:      61.4 ml RIGHT VENTRICLE             IVC RV S prime:  10.30 cm/s  IVC diam: 2.30 cm TAPSE (M-mode): 2.0 cm LEFT ATRIUM              Index        RIGHT ATRIUM           Index LA diam:        5.30 cm  2.48 cm/m   RA Area:     37.40 cm LA Vol (A2C):   131.0 ml 61.34 ml/m  RA Volume:   162.00 ml 75.86 ml/m LA Vol (A4C):   128.0 ml 59.94 ml/m LA Biplane Vol: 130.0 ml 60.87 ml/m  AORTIC VALVE                    PULMONIC VALVE AV Area (Vmax):    1.85 cm     PV Vmax:       0.40 m/s AV Area (Vmean):   1.84 cm     PV Peak grad:  0.6 mmHg AV Area (VTI):     2.04 cm AV Vmax:           96.10 cm/s AV Vmean:           66.500 cm/s AV VTI:            0.175 m AV Peak Grad:      3.7 mmHg AV Mean Grad:      2.0 mmHg LVOT Vmax:         69.80 cm/s LVOT Vmean:        48.000 cm/s LVOT VTI:          0.140 m LVOT/AV VTI ratio: 0.80 AI PHT:            825 msec  AORTA Ao Root diam: 2.80 cm Ao Asc diam:  3.90 cm MITRAL VALVE               TRICUSPID VALVE MV Area VTI:  0.29 cm     TV Peak grad:   48.7 mmHg MV Peak grad: 35.3 mmHg    TV Mean grad:   32.0 mmHg MV Mean grad: 28.0 mmHg    TV Vmax:        3.49 m/s MV Vmax:      2.97 m/s     TV Vmean:       266.0 cm/s MV Vmean:     260.0 cm/s   TV VTI:         1.30 msec MV E velocity: 89.12 cm/s  TR Peak grad:   39.9 mmHg                            TR Vmax:        316.00 cm/s                             SHUNTS                            Systemic VTI:  0.14 m                            Systemic Diam: 1.80 cm Lyman Bishop MD Electronically signed by Lyman Bishop MD Signature Date/Time: 06/13/2021/5:40:25 PM    Final         Scheduled Meds:  [START ON 06/15/2021] aspirin EC  81  mg Oral Daily   feeding supplement  237 mL Oral BID BM   furosemide  80 mg Intravenous BID   influenza vaccine adjuvanted  0.5 mL Intramuscular Tomorrow-1000   multivitamin with minerals  1 tablet Oral Daily   pneumococcal 23 valent vaccine  0.5 mL Intramuscular Tomorrow-1000   simvastatin  10 mg Oral QHS   sodium chloride flush  3 mL Intravenous Q12H   sodium chloride flush  3 mL Intravenous Q12H   Continuous Infusions:  sodium chloride     sodium chloride 10 mL/hr at 06/14/21 0521   heparin 1,200 Units/hr (06/14/21 0201)     LOS: 2 days        Hosie Poisson, MD [facility]   To contact the attending provider between 7A-7P or the covering provider during after hours 7P-7A, please log into the web site [email or web address] and access using universal [place] password for that web site. If you do not have the password, please call the hospital operator.  06/14/2021, 9:17 AM

## 2021-06-14 NOTE — Evaluation (Signed)
Occupational Therapy Evaluation Patient Details Name: Evan Serrano MRN: [id number] DOB: Apr 12, 1951 Today's Date: 06/14/2021   History of Present Illness Pt is a 71 y.o. male who presented 06/12/21 to ED with SOB, transferred to [facility] 1/13. Pt with 2 syncopal episodes secondary to coughing. Pt admitted with CHF exacerbation. 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 PTA pt lived at home with his wife in a second floor apartment (says he will be closing on a 1 level home February 7)  and was Ind with ADLs/selfcare, used can and RW for mobility, was driving. Pt currently requires min A - min guard A with LB ADLs, min guard A with mobility using RW. Pt would benefit from acute OT services to address impairments to maximize level of function and safety     Recommendations for follow up therapy are one component of a multi-disciplinary discharge planning process, led by the attending physician.  Recommendations may be updated based on patient status, additional functional criteria and insurance authorization.   Follow Up Recommendations  Home health OT    Assistance Recommended at Discharge Intermittent Supervision/Assistance  Patient can return home with the following A little help with walking and/or transfers;A little help with bathing/dressing/bathroom;Assist for transportation    Functional Status Assessment  Patient has had a recent decline in their functional status and demonstrates the ability to make significant improvements in function in a reasonable and predictable amount of time.  Equipment Recommendations  Tub/shower seat    Recommendations for Other Services PT consult     Precautions / Restrictions Precautions Precautions: Fall Precaution Comments: 1.5L O2 baseline Restrictions Weight Bearing Restrictions: No       Mobility Bed Mobility Overal bed mobility: Needs Assistance Bed Mobility: Supine to Sit;Sit to Supine     Supine to sit: Supervision;HOB elevated Sit to supine: Supervision   General bed mobility comments: sup for safety, no physical assist required    Transfers Overall transfer level: Needs assistance Equipment used: Rolling walker (2 wheels) Transfers: Sit to/from Stand;Bed to chair/wheelchair/BSC Sit to Stand: Min guard     Step pivot transfers: Min guard     General transfer comment: min guard A for safety      Balance Overall balance assessment: Mild deficits observed, not formally tested                                         ADL either performed or assessed with clinical judgement   ADL Overall ADL's : Needs assistance/impaired Eating/Feeding: Independent;Sitting   Grooming: Wash/dry hands;Wash/dry face;Min guard;Standing   Upper Body Bathing: Set up;Sitting   Lower Body Bathing: Minimal assistance;Min guard   Upper Body Dressing : Set up;Sitting   Lower Body Dressing: Minimal assistance;Min guard   Toilet Transfer: Min guard;Ambulation;Rolling walker (2 wheels);Regular Toilet   Toileting- Architect and Hygiene: Min guard;Sit to/from stand       Functional mobility during ADLs: Min guard;Rolling walker (2 wheels)       Vision Baseline Vision/History: 1 Wears glasses Ability to See in Adequate Light: 0 Adequate Patient Visual Report: No change from baseline       Perception     Praxis      Pertinent Vitals/Pain Pain Assessment: No/denies pain  Hand Dominance Right   Extremity/Trunk Assessment Upper Extremity Assessment Upper Extremity Assessment: Generalized weakness   Lower Extremity Assessment Lower Extremity Assessment: Defer to PT evaluation   Cervical / Trunk Assessment Cervical / Trunk Assessment: Normal   Communication Communication Communication: No difficulties   Cognition  Arousal/Alertness: Awake/alert Behavior During Therapy: WFL for tasks assessed/performed Overall Cognitive Status: Within Functional Limits for tasks assessed                                 General Comments: very friendly,  talkative     General Comments       Exercises     Shoulder Instructions      Home Living Family/patient expects to be discharged to:: Private residence Living Arrangements: Spouse/significant other Available Help at Discharge: Family Type of Home: Apartment Home Access: Stairs to enter Technical brewer of Steps: 15 Entrance Stairs-Rails: Right Home Layout: One level     Bathroom Shower/Tub: Teacher, early years/pre: Standard Bathroom Accessibility: Yes   Home Equipment: Conservation officer, nature (2 wheels);Cane - single point;BSC/3in1   Additional Comments: Wears 1.5 L of supplemental O2 during day and wears CPAP at night      Prior Functioning/Environment Prior Level of Function : Independent/Modified Independent             Mobility Comments: Normally holds onto walls for support, but intermittently uses SPC when weak and the RW when very weak. Denies any falls in past 6 months. ADLs Comments: Ind with bathing, dressimg, grooming anf toileting. Uses arm rests of bedside commode in shower for UE support while propping one foot up on it at a time to wash. Does not like to transfer bedside commode back out of shower due to constantly having to wipe it down.        OT Problem List: Decreased strength;Decreased activity tolerance;Decreased knowledge of use of DME or AE      OT Treatment/Interventions:      OT Goals(Current goals can be found in the care plan section) Acute Rehab OT Goals Patient Stated Goal: go home OT Goal Formulation: With patient Time For Goal Achievement: 06/28/21 Potential to Achieve Goals: Good ADL Goals Pt Will Perform Grooming: with supervision;with set-up;with modified independence;standing Pt  Will Perform Upper Body Bathing: with modified independence Pt Will Perform Lower Body Bathing: with min guard assist;with supervision;sit to/from stand Pt Will Perform Upper Body Dressing: with modified independence Pt Will Perform Lower Body Dressing: with min guard assist;with supervision;sit to/from stand Pt Will Perform Toileting - Clothing Manipulation and hygiene: with supervision;with modified independence;sit to/from stand Pt Will Perform Tub/Shower Transfer: with min guard assist;with supervision;ambulating;shower seat  OT Frequency: Min 2X/week    Co-evaluation              AM-PAC OT "6 Clicks" Daily Activity     Outcome Measure Help from another person eating meals?: None Help from another person taking care of personal grooming?: A Little Help from another person toileting, which includes using toliet, bedpan, or urinal?: A Little Help from another person bathing (including washing, rinsing, drying)?: A Little Help from another person to put on and taking off regular upper body clothing?: None Help from another person to put on and taking off regular lower body clothing?: A Little 6 Click Score: 20   End of Session Equipment Utilized During Treatment: Gait belt;Rolling walker (2 wheels)  Activity Tolerance: Patient tolerated treatment well  Patient left: in bed;with call bell/phone within reach;with bed alarm set  OT Visit Diagnosis: Other abnormalities of gait and mobility (R26.89);Muscle weakness (generalized) (M62.81)                Time: [id number] OT Time Calculation (min): 27 min Charges:  OT General Charges $OT Visit: 1 Visit OT Evaluation $OT Eval Moderate Complexity: 1 Mod OT Treatments $Self Care/Home Management : 8-22 mins   Emmit Alexanders [facility] 06/14/2021, 1:29 PM

## 2021-06-14 NOTE — Interval H&P Note (Signed)
History and Physical Interval Note:  06/14/2021 4:24 PM  Evan Serrano  has presented today for surgery, with the diagnosis of chest pain.  The various methods of treatment have been discussed with the patient and family. After consideration of risks, benefits and other options for treatment, the patient has consented to  Procedure(s): RIGHT/LEFT HEART CATH AND CORONARY ANGIOGRAPHY (N/A) as a surgical intervention.  The patient's history has been reviewed, patient examined, no change in status, stable for surgery.  I have reviewed the patient's chart and labs.  Questions were answered to the patient's satisfaction.     Tonny Bollman

## 2021-06-14 NOTE — Progress Notes (Signed)
DAILY PROGRESS NOTE   Patient Name: Evan Serrano Date of Encounter: 06/14/2021 Cardiologist: Pixie Casino, MD  Chief Complaint   Breathing is good  Patient Profile   Evan Serrano is a 71 y.o. male with a hx of HFrEF, ICD in place, HTN, orthostatic hypotension, renal insufficiency, PAF on eliquis, and OSA on CPAP who is being seen 06/13/2021 for the evaluation of CHF exacerbation at the request of Dr. Cyndia Skeeters.  Subjective   Breathing better - no chest pain overnight. Plan for [facility] today-  recent ICD shock for VF, LVEF is lower at 20-25% with global HK and grade III DD, severe biatrial enlargement, elevated LV filling pressures and moderate pulmonary hypertension.  Diuresed about 1.6L negative overnight. Creatinine stable around 1.48-1.53. Weight is down about 2kg since yesterday.  Objective   Vitals:   06/13/21 2200 06/14/21 0029 06/14/21 0453 06/14/21 0751  BP:  112/69 94/69 99/80   Pulse:  75 70 71  Resp:  20 17 20   Temp:  98.4 F (36.9 C) 97.9 F (36.6 C) 98.4 F (36.9 C)  TempSrc:  Oral Oral Oral  SpO2:  99% 92% 99%  Weight: 99.3 kg 98.9 kg    Height:  5\' 8"  (1.727 m)      Intake/Output Summary (Last 24 hours) at 06/14/2021 0811 Last data filed at 06/14/2021 0600 Gross per 24 hour  Intake 1563.78 ml  Output 3200 ml  Net -1636.22 ml   Filed Weights   06/13/21 0700 06/13/21 2200 06/14/21 0029  Weight: 100.6 kg 99.3 kg 98.9 kg    Physical Exam   General appearance: alert and no distress Neck: JVD - 5 cm above sternal notch, no carotid bruit, and thyroid not enlarged, symmetric, no tenderness/mass/nodules Lungs: diminished breath sounds bibasilar Heart: regular rate and rhythm Abdomen: soft, non-tender; bowel sounds normal; no masses,  no organomegaly and mildly protuberant Extremities: extremities normal, atraumatic, no cyanosis or edema Pulses: 2+ and symmetric Skin: Skin color, texture, turgor normal. No rashes or lesions Neurologic: Grossly  normal Psych: Pleasant  Inpatient Medications    Scheduled Meds:  [START ON 06/15/2021] aspirin EC  81 mg Oral Daily   feeding supplement  237 mL Oral BID BM   furosemide  80 mg Intravenous BID   influenza vaccine adjuvanted  0.5 mL Intramuscular Tomorrow-1000   multivitamin with minerals  1 tablet Oral Daily   pneumococcal 23 valent vaccine  0.5 mL Intramuscular Tomorrow-1000   simvastatin  10 mg Oral QHS   sodium chloride flush  3 mL Intravenous Q12H   sodium chloride flush  3 mL Intravenous Q12H    Continuous Infusions:  sodium chloride     sodium chloride 10 mL/hr at 06/14/21 0521   heparin 1,200 Units/hr (06/14/21 0201)    PRN Meds: sodium chloride, acetaminophen **OR** acetaminophen, sodium chloride flush   Labs   Results for orders placed or performed during the hospital encounter of 06/12/21 (from the past 48 hour(s))  CBC     Status: Abnormal   Collection Time: 06/12/21 10:30 AM  Result Value Ref Range   WBC 5.8 4.0 - 10.5 K/uL   RBC 5.90 (H) 4.22 - 5.81 MIL/uL   Hemoglobin 15.8 13.0 - 17.0 g/dL   HCT 48.9 39.0 - 52.0 %   MCV 82.9 80.0 - 100.0 fL   MCH 26.8 26.0 - 34.0 pg   MCHC 32.3 30.0 - 36.0 g/dL   RDW 16.8 (H) 11.5 - 15.5 %   Platelets 130 (L) 150 -  400 K/uL    Comment: REPEATED TO VERIFY   nRBC 0.0 0.0 - 0.2 %    Comment: Performed at [facility], [address] [place], [place] [address]  Comprehensive metabolic panel     Status: Abnormal   Collection Time: 06/12/21 10:30 AM  Result Value Ref Range   Sodium 139 135 - 145 mmol/L   Potassium 3.6 3.5 - 5.1 mmol/L   Chloride 101 98 - 111 mmol/L   CO2 29 22 - 32 mmol/L   Glucose, Bld 92 70 - 99 mg/dL    Comment: Glucose reference range applies only to samples taken after fasting for at least 8 hours.   BUN 17 8 - 23 mg/dL   Creatinine, Ser 1.46 (H) 0.61 - 1.24 mg/dL   Calcium 9.4 8.9 - 10.3 mg/dL   Total Protein 6.9 6.5 - 8.1 g/dL   Albumin 3.8 3.5 - 5.0 g/dL   AST 23 15 - 41  U/L   ALT 21 0 - 44 U/L   Alkaline Phosphatase 83 38 - 126 U/L   Total Bilirubin 2.8 (H) 0.3 - 1.2 mg/dL   GFR, Estimated 51 (L) >60 mL/min    Comment: (NOTE) Calculated using the CKD-EPI Creatinine Equation (2021)    Anion gap 9 5 - 15    Comment: Performed at [facility], [place], [place] [address]  Troponin I (High Sensitivity)     Status: Abnormal   Collection Time: 06/12/21 10:30 AM  Result Value Ref Range   Troponin I (High Sensitivity) 22 (H) <18 ng/L    Comment: (NOTE) Elevated high sensitivity troponin I (hsTnI) values and significant  changes across serial measurements may suggest ACS but many other  chronic and acute conditions are known to elevate hsTnI results.  Refer to the "Links" section for chest pain algorithms and additional  guidance. Performed at [facility], [address] [place], [place] [address]   Brain natriuretic peptide     Status: Abnormal   Collection Time: 06/12/21 10:30 AM  Result Value Ref Range   B Natriuretic Peptide 1,445.1 (H) 0.0 - 100.0 pg/mL    Comment: Performed at [facility], [place], [place] [address]  D-dimer, quantitative     Status: None   Collection Time: 06/12/21 10:30 AM  Result Value Ref Range   D-Dimer, Quant <0.27 0.00 - 0.50 ug/mL-FEU    Comment: (NOTE) At the manufacturer cut-off value of 0.5 g/mL FEU, this assay has a negative predictive value of 95-100%.This assay is intended for use in conjunction with a clinical pretest probability (PTP) assessment model to exclude pulmonary embolism (PE) and deep venous thrombosis (DVT) in outpatients suspected of PE or DVT. Results should be correlated with clinical presentation. Performed at [facility], [address] [place], [place] [address]   CBG monitoring, ED     Status: None   Collection Time: 06/12/21 10:37 AM  Result Value Ref Range    Glucose-Capillary 99 70 - 99 mg/dL    Comment: Glucose reference range applies only to samples taken after fasting for at least 8 hours.  Resp Panel by RT-PCR (Flu A&B, Covid) Nasopharyngeal Swab     Status: None   Collection Time: 06/12/21 11:58 AM   Specimen: Nasopharyngeal Swab; Nasopharyngeal(NP) swabs in vial transport medium  Result Value Ref Range   SARS Coronavirus 2 by RT PCR NEGATIVE NEGATIVE    Comment: (NOTE) SARS-CoV-2 target nucleic acids are NOT DETECTED.  The SARS-CoV-2 RNA is generally detectable in upper respiratory specimens during the acute phase of infection. The lowest concentration of SARS-CoV-2 viral copies this assay can detect is 138 copies/mL. A negative result does not preclude SARS-Cov-2 infection and should not be used as the sole basis for treatment or other patient management decisions. A negative result may occur with  improper specimen collection/handling, submission of specimen other than nasopharyngeal swab, presence of viral mutation(s) within the areas targeted by this assay, and inadequate number of viral copies(<138 copies/mL). A negative result must be combined with clinical observations, patient history, and epidemiological information. The expected result is Negative.  Fact Sheet for Patients:  EntrepreneurPulse.com.au  Fact Sheet for Healthcare Providers:  IncredibleEmployment.be  This test is no t yet approved or cleared by the [place] FDA and  has been authorized for detection and/or diagnosis of SARS-CoV-2 by FDA under an Emergency Use Authorization (EUA). This EUA will remain  in effect (meaning this test can be used) for the duration of the COVID-19 declaration under Section 564(b)(1) of the Act, 21 U.S.C.section 360bbb-3(b)(1), unless the authorization is terminated  or revoked sooner.       Influenza A by PCR NEGATIVE NEGATIVE   Influenza B by PCR NEGATIVE NEGATIVE    Comment:  (NOTE) The Xpert Xpress SARS-CoV-2/FLU/RSV plus assay is intended as an aid in the diagnosis of influenza from Nasopharyngeal swab specimens and should not be used as a sole basis for treatment. Nasal washings and aspirates are unacceptable for Xpert Xpress SARS-CoV-2/FLU/RSV testing.  Fact Sheet for Patients: EntrepreneurPulse.com.au  Fact Sheet for Healthcare Providers: IncredibleEmployment.be  This test is not yet approved or cleared by the [place] FDA and has been authorized for detection and/or diagnosis of SARS-CoV-2 by FDA under an Emergency Use Authorization (EUA). This EUA will remain in effect (meaning this test can be used) for the duration of the COVID-19 declaration under Section 564(b)(1) of the Act, 21 U.S.C. section 360bbb-3(b)(1), unless the authorization is terminated or revoked.  Performed at [facility], [address] [place], [place] [address]   Urinalysis, Routine w reflex microscopic Urine, Clean Catch     Status: Abnormal   Collection Time: 06/12/21  1:42 PM  Result Value Ref Range   Color, Urine YELLOW YELLOW   APPearance CLEAR CLEAR   Specific Gravity, Urine 1.023 1.005 - 1.030   pH 5.5 5.0 - 8.0   Glucose, UA NEGATIVE NEGATIVE mg/dL   Hgb urine dipstick NEGATIVE NEGATIVE   Bilirubin Urine NEGATIVE NEGATIVE   Ketones, ur NEGATIVE NEGATIVE mg/dL   Protein, ur 30 (A) NEGATIVE mg/dL   Nitrite NEGATIVE NEGATIVE   Leukocytes,Ua LARGE (A) NEGATIVE   RBC / HPF 0-5 0 - 5 RBC/hpf   WBC, UA 11-20 0 - 5 WBC/hpf   Squamous Epithelial / LPF 0-5 0 - 5   Mucus PRESENT    Hyaline Casts, UA PRESENT     Comment: Performed at [facility], [place], [place] [address]  Troponin I (High Sensitivity)     Status: Abnormal   Collection Time: 06/12/21  1:42 PM  Result Value Ref Range   Troponin I (High Sensitivity) 24 (H) <18 ng/L    Comment: (NOTE) Elevated high  sensitivity troponin I (hsTnI) values and significant  changes across serial measurements may suggest ACS but many other  chronic and acute conditions are known to elevate hsTnI results.  Refer to the "Links" section for chest pain algorithms and additional  guidance. Performed at [facility], [address], [place], [place] [address]   Hepatic function panel     Status: Abnormal   Collection Time: 06/12/21  6:39 PM  Result Value Ref Range   Total Protein 7.3 6.5 - 8.1 g/dL   Albumin 3.7 3.5 - 5.0 g/dL   AST 27 15 - 41 U/L   ALT 25 0 - 44 U/L   Alkaline Phosphatase 85 38 - 126 U/L   Total Bilirubin 3.0 (H) 0.3 - 1.2 mg/dL   Bilirubin, Direct 0.6 (H) 0.0 - 0.2 mg/dL   Indirect Bilirubin 2.4 (H) 0.3 - 0.9 mg/dL    Comment: Performed at [facility], [place] [address], [place], [place] [address]  Troponin I (High Sensitivity)     Status: Abnormal   Collection Time: 06/12/21  6:39 PM  Result Value Ref Range   Troponin I (High Sensitivity) 21 (H) <18 ng/L    Comment: (NOTE) Elevated high sensitivity troponin I (hsTnI) values and significant  changes across serial measurements may suggest ACS but many other  chronic and acute conditions are known to elevate hsTnI results.  Refer to the "Links" section for chest pain algorithms and additional  guidance. Performed at [facility], [place] [address], [place], [place] [address]   Magnesium     Status: None   Collection Time: 06/12/21  6:39 PM  Result Value Ref Range   Magnesium 2.3 1.7 - 2.4 mg/dL    Comment: Performed at [facility], [place] [address] [place], [place] [address]  CBC     Status: Abnormal   Collection Time: 06/13/21  3:36 AM  Result Value Ref Range   WBC 6.7 4.0 - 10.5 K/uL   RBC 5.19 4.22 - 5.81 MIL/uL   Hemoglobin 14.3 13.0 - 17.0 g/dL   HCT 43.8 39.0 - 52.0 %   MCV 84.4 80.0 - 100.0 fL   MCH 27.6 26.0 - 34.0 pg   MCHC 32.6 30.0 - 36.0 g/dL    RDW 16.3 (H) 11.5 - 15.5 %   Platelets 129 (L) 150 - 400 K/uL    Comment: REPEATED TO VERIFY   nRBC 0.0 0.0 - 0.2 %    Comment: Performed at [facility], [place] [address] [place], [place] [address]  Comprehensive metabolic panel     Status: Abnormal   Collection Time: 06/13/21  3:36 AM  Result Value Ref Range   Sodium 139 135 - 145 mmol/L   Potassium 3.8 3.5 - 5.1 mmol/L   Chloride 103 98 - 111 mmol/L   CO2 27 22 - 32 mmol/L   Glucose, Bld 89 70 - 99 mg/dL    Comment: Glucose reference range applies only to samples taken after fasting for at least 8 hours.   BUN 18 8 - 23 mg/dL   Creatinine, Ser 1.48 (H) 0.61 - 1.24 mg/dL   Calcium 9.1 8.9 - 10.3 mg/dL   Total Protein 6.2 (L) 6.5 - 8.1 g/dL   Albumin 3.3 (L) 3.5 - 5.0 g/dL   AST 23 15 - 41 U/L   ALT 12 0 - 44 U/L   Alkaline Phosphatase 76 38 - 126 U/L   Total Bilirubin 2.6 (H) 0.3 - 1.2 mg/dL   GFR, Estimated 50 (L) >60 mL/min    Comment: (NOTE) Calculated using the CKD-EPI Creatinine Equation (2021)    Anion gap 9 5 - 15    Comment: Performed at [facility],  [place] [address], [place], [place] [address]  APTT     Status: Abnormal   Collection Time: 06/14/21 12:48 AM  Result Value Ref Range   aPTT 133 (H) 24 - 36 seconds    Comment:        IF BASELINE aPTT IS ELEVATED, SUGGEST PATIENT RISK ASSESSMENT BE USED TO DETERMINE APPROPRIATE ANTICOAGULANT THERAPY. Performed at [facility], [place] [address], [place], [place] [address]   Heparin level (unfractionated)     Status: Abnormal   Collection Time: 06/14/21 12:48 AM  Result Value Ref Range   Heparin Unfractionated >1.10 (H) 0.30 - 0.70 IU/mL    Comment: (NOTE) The clinical reportable range upper limit is being lowered to >1.10 to align with the FDA approved guidance for the current laboratory assay.  If heparin results are below expected values, and patient dosage has  been confirmed, suggest follow up testing of antithrombin  III levels. Performed at [facility], [place] [address] [place], [place] [address]   Renal function panel     Status: Abnormal   Collection Time: 06/14/21  2:09 AM  Result Value Ref Range   Sodium 138 135 - 145 mmol/L   Potassium 3.4 (L) 3.5 - 5.1 mmol/L   Chloride 104 98 - 111 mmol/L   CO2 29 22 - 32 mmol/L   Glucose, Bld 87 70 - 99 mg/dL    Comment: Glucose reference range applies only to samples taken after fasting for at least 8 hours.   BUN 16 8 - 23 mg/dL   Creatinine, Ser 1.48 (H) 0.61 - 1.24 mg/dL   Calcium 8.4 (L) 8.9 - 10.3 mg/dL   Phosphorus 3.7 2.5 - 4.6 mg/dL   Albumin 3.0 (L) 3.5 - 5.0 g/dL   GFR, Estimated 50 (L) >60 mL/min    Comment: (NOTE) Calculated using the CKD-EPI Creatinine Equation (2021)    Anion gap 5 5 - 15    Comment: Performed at [facility] [address], [place], [place] [address]  CBC     Status: Abnormal   Collection Time: 06/14/21  2:09 AM  Result Value Ref Range   WBC 7.2 4.0 - 10.5 K/uL   RBC 5.28 4.22 - 5.81 MIL/uL   Hemoglobin 14.2 13.0 - 17.0 g/dL   HCT 44.4 39.0 - 52.0 %   MCV 84.1 80.0 - 100.0 fL   MCH 26.9 26.0 - 34.0 pg   MCHC 32.0 30.0 - 36.0 g/dL   RDW 16.2 (H) 11.5 - 15.5 %   Platelets 115 (L) 150 - 400 K/uL    Comment: REPEATED TO VERIFY PLATELET COUNT CONFIRMED BY SMEAR    nRBC 0.0 0.0 - 0.2 %    Comment: Performed at [facility], [place] [address], [place], [place] [address]  Magnesium     Status: None   Collection Time: 06/14/21  2:09 AM  Result Value Ref Range   Magnesium 2.2 1.7 - 2.4 mg/dL    Comment: Performed at [facility] [address], [place], [place] [address]  TSH     Status: None   Collection Time: 06/14/21  2:09 AM  Result Value Ref Range   TSH 3.168 0.350 - 4.500 uIU/mL    Comment: Performed by a 3rd Generation assay with a functional sensitivity of <=0.01 uIU/mL. Performed at [facility], [place] [address], [place], [place] [id number]   Basic metabolic panel     Status:  Abnormal  Collection Time: 06/14/21  4:47 AM  Result Value Ref Range   Sodium 137 135 - 145 mmol/L   Potassium 3.5 3.5 - 5.1 mmol/L   Chloride 102 98 - 111 mmol/L   CO2 29 22 - 32 mmol/L   Glucose, Bld 92 70 - 99 mg/dL    Comment: Glucose reference range applies only to samples taken after fasting for at least 8 hours.   BUN 15 8 - 23 mg/dL   Creatinine, Ser 1.53 (H) 0.61 - 1.24 mg/dL   Calcium 8.8 (L) 8.9 - 10.3 mg/dL   GFR, Estimated 48 (L) >60 mL/min    Comment: (NOTE) Calculated using the CKD-EPI Creatinine Equation (2021)    Anion gap 6 5 - 15    Comment: Performed at [facility] [address] [place], [place] [address]    ECG   N/A  Telemetry   Sinus rhythm with PVCs, short 6 beat run of NSVT - Personally Reviewed  Radiology    DG Chest Port 1 View  Result Date: 06/12/2021 CLINICAL DATA:  Shortness of breath EXAM: PORTABLE CHEST 1 VIEW COMPARISON:  05/24/2021 FINDINGS: Unchanged position of left chest cardiac device and lead. Unchanged cardiomegaly. Unchanged mediastinal contours. Unchanged vascular congestion without overt pulmonary edema. No focal pulmonary opacity. No definite pleural effusion. No acute osseous abnormality. IMPRESSION: Cardiomegaly with vascular congestion but without overt pulmonary edema. Electronically Signed   By: Merilyn Baba M.D.   On: 06/12/2021 12:03   ECHOCARDIOGRAM COMPLETE  Result Date: 06/13/2021    ECHOCARDIOGRAM REPORT   Patient Name:   Evan Serrano Date of Exam: 06/13/2021 Medical Rec #:  [id number]        Height:       68.0 in Accession #:    [id number]       Weight:       221.8 lb Date of Birth:  04-02-1951       BSA:          2.136 m Patient Age:    77 years         BP:           104/87 mmHg Patient Gender: M                HR:           72 bpm. Exam Location:  Inpatient Procedure: 2D Echo, Cardiac Doppler and Color Doppler Indications:    CHF  History:        Patient has prior history of Echocardiogram examinations, most                  recent 01/13/2021. Arrythmias:Atrial Fibrillation,                 Signs/Symptoms:Syncope and Dyspnea; Risk Factors:Hypertension.                 VTACH/ pacer device.  Sonographer:    Beryle Beams Referring Phys: [id number] [place]  1. Left ventricular ejection fraction, by estimation, is 20 to 25%. The left ventricle has severely decreased function. The left ventricle demonstrates global hypokinesis. The left ventricular internal cavity size was severely dilated. Left ventricular diastolic parameters are consistent with Grade III diastolic dysfunction (restrictive). Elevated left ventricular end-diastolic pressure.  2. Right ventricular systolic function is low normal. The right ventricular size is normal. There is moderately elevated pulmonary artery systolic pressure. The estimated right ventricular systolic pressure is 123456 mmHg.  3. Left atrial size  was severely dilated.  4. Right atrial size was severely dilated.  5. The mitral valve is abnormal. Mild to moderate mitral valve regurgitation.  6. The tricuspid valve is abnormal. Tricuspid valve regurgitation is moderate.  7. The aortic valve is tricuspid. Aortic valve regurgitation is mild. Aortic valve sclerosis/calcification is present, without any evidence of aortic stenosis.  8. Aortic dilatation noted. There is borderline dilatation of the ascending aorta, measuring 39 mm.  9. The inferior vena cava is dilated in size with <50% respiratory variability, suggesting right atrial pressure of 15 mmHg. Comparison(s): Changes from prior study are noted. 01/13/2021: LVEF 20-25%, global HK, grade 3 DD, elevated LVEDP. FINDINGS  Left Ventricle: Left ventricular ejection fraction, by estimation, is 20 to 25%. The left ventricle has severely decreased function. The left ventricle demonstrates global hypokinesis. The left ventricular internal cavity size was severely dilated. There is no left ventricular hypertrophy. Left ventricular  diastolic parameters are consistent with Grade III diastolic dysfunction (restrictive). Elevated left ventricular end-diastolic pressure. Right Ventricle: The right ventricular size is normal. No increase in right ventricular wall thickness. Right ventricular systolic function is low normal. There is moderately elevated pulmonary artery systolic pressure. The tricuspid regurgitant velocity  is 3.16 m/s, and with an assumed right atrial pressure of 15 mmHg, the estimated right ventricular systolic pressure is 123456 mmHg. Left Atrium: Left atrial size was severely dilated. Right Atrium: Right atrial size was severely dilated. Pericardium: There is no evidence of pericardial effusion. Mitral Valve: The mitral valve is abnormal. There is mild thickening of the anterior and posterior mitral valve leaflet(s). Mild to moderate mitral valve regurgitation. MV peak gradient, 35.3 mmHg. The mean mitral valve gradient is 28.0 mmHg. Tricuspid Valve: The tricuspid valve is abnormal. Tricuspid valve regurgitation is moderate. Aortic Valve: The aortic valve is tricuspid. Aortic valve regurgitation is mild. Aortic regurgitation PHT measures 825 msec. Aortic valve sclerosis/calcification is present, without any evidence of aortic stenosis. Aortic valve mean gradient measures 2.0  mmHg. Aortic valve peak gradient measures 3.7 mmHg. Aortic valve area, by VTI measures 2.04 cm. Pulmonic Valve: The pulmonic valve was grossly normal. Pulmonic valve regurgitation is trivial. Aorta: Aortic dilatation noted. There is borderline dilatation of the ascending aorta, measuring 39 mm. Venous: The inferior vena cava is dilated in size with less than 50% respiratory variability, suggesting right atrial pressure of 15 mmHg. IAS/Shunts: No atrial level shunt detected by color flow Doppler. Additional Comments: A device lead is visualized.  LEFT VENTRICLE PLAX 2D LVIDd:         7.00 cm      Diastology LVIDs:         6.50 cm      LV e' medial:    6.31  cm/s LV PW:         1.00 cm      LV E/e' medial:  14.1 LV IVS:        0.90 cm      LV e' lateral:   7.51 cm/s LVOT diam:     1.80 cm      LV E/e' lateral: 11.9 LV SV:         36 LV SV Index:   17 LVOT Area:     2.54 cm  LV Volumes (MOD) LV vol d, MOD A2C: 203.0 ml LV vol d, MOD A4C: 204.0 ml LV vol s, MOD A2C: 151.0 ml LV vol s, MOD A4C: 136.0 ml LV SV MOD A2C:     52.0 ml LV  SV MOD A4C:     204.0 ml LV SV MOD BP:      61.4 ml RIGHT VENTRICLE             IVC RV S prime:     10.30 cm/s  IVC diam: 2.30 cm TAPSE (M-mode): 2.0 cm LEFT ATRIUM              Index        RIGHT ATRIUM           Index LA diam:        5.30 cm  2.48 cm/m   RA Area:     37.40 cm LA Vol (A2C):   131.0 ml 61.34 ml/m  RA Volume:   162.00 ml 75.86 ml/m LA Vol (A4C):   128.0 ml 59.94 ml/m LA Biplane Vol: 130.0 ml 60.87 ml/m  AORTIC VALVE                    PULMONIC VALVE AV Area (Vmax):    1.85 cm     PV Vmax:       0.40 m/s AV Area (Vmean):   1.84 cm     PV Peak grad:  0.6 mmHg AV Area (VTI):     2.04 cm AV Vmax:           96.10 cm/s AV Vmean:          66.500 cm/s AV VTI:            0.175 m AV Peak Grad:      3.7 mmHg AV Mean Grad:      2.0 mmHg LVOT Vmax:         69.80 cm/s LVOT Vmean:        48.000 cm/s LVOT VTI:          0.140 m LVOT/AV VTI ratio: 0.80 AI PHT:            825 msec  AORTA Ao Root diam: 2.80 cm Ao Asc diam:  3.90 cm MITRAL VALVE               TRICUSPID VALVE MV Area VTI:  0.29 cm     TV Peak grad:   48.7 mmHg MV Peak grad: 35.3 mmHg    TV Mean grad:   32.0 mmHg MV Mean grad: 28.0 mmHg    TV Vmax:        3.49 m/s MV Vmax:      2.97 m/s     TV Vmean:       266.0 cm/s MV Vmean:     260.0 cm/s   TV VTI:         1.30 msec MV E velocity: 89.12 cm/s  TR Peak grad:   39.9 mmHg                            TR Vmax:        316.00 cm/s                             SHUNTS                            Systemic VTI:  0.14 m  Systemic Diam: 1.80 cm Lyman Bishop MD Electronically signed by Lyman Bishop MD Signature  Date/Time: 06/13/2021/5:40:25 PM    Final     Cardiac Studies   See echo above  Assessment   Principal Problem:   CHF exacerbation ([place]) Active Problems:   Orthostatic hypotension   Renal insufficiency   Implantable cardioverter-defibrillator (ICD) in situ   Hyperlipidemia   Obstructive sleep apnea syndrome   Plan   Good diuresis overnight- creatinine essentially stable. Will continue given volume overload by echo - plan L/RHC today to evaluate decline in LVEF, recent VT s/p AICD shock and filling pressures/cardiac output. Discussed the risks, benefits and alteratives as well as the procedure in depth with him and he is willing to proceed. BP remains soft, little room to uptitrate HF meds at this time.  Time Spent Directly with Patient:  I have spent a total of 25 minutes with the patient reviewing hospital notes, telemetry, EKGs, labs and examining the patient as well as establishing an assessment and plan that was discussed personally with the patient.  > 50% of time was spent in direct patient care.  Length of Stay:  LOS: 2 days   Pixie Casino, MD, [facility], [place] Director of the Advanced Lipid Disorders &  Cardiovascular Risk Reduction Clinic Diplomate of the American Board of Clinical Lipidology Attending Cardiologist  Direct Dial: [phone]   [email or web address]: [phone]  Website:  [email or web address] 06/14/2021, 8:11 AM

## 2021-06-14 NOTE — H&P (View-Only) (Signed)
DAILY PROGRESS NOTE   Patient Name: Evan Serrano Date of Encounter: 06/14/2021 Cardiologist: Pixie Casino, MD  Chief Complaint   Breathing is good  Patient Profile   Evan Serrano is a 71 y.o. male with a hx of HFrEF, ICD in place, HTN, orthostatic hypotension, renal insufficiency, PAF on eliquis, and OSA on CPAP who is being seen 06/13/2021 for the evaluation of CHF exacerbation at the request of Dr. Cyndia Skeeters.  Subjective   Breathing better - no chest pain overnight. Plan for [facility] today-  recent ICD shock for VF, LVEF is lower at 20-25% with global HK and grade III DD, severe biatrial enlargement, elevated LV filling pressures and moderate pulmonary hypertension.  Diuresed about 1.6L negative overnight. Creatinine stable around 1.48-1.53. Weight is down about 2kg since yesterday.  Objective   Vitals:   06/13/21 2200 06/14/21 0029 06/14/21 0453 06/14/21 0751  BP:  112/69 94/69 99/80   Pulse:  75 70 71  Resp:  20 17 20   Temp:  98.4 F (36.9 C) 97.9 F (36.6 C) 98.4 F (36.9 C)  TempSrc:  Oral Oral Oral  SpO2:  99% 92% 99%  Weight: 99.3 kg 98.9 kg    Height:  5\' 8"  (1.727 m)      Intake/Output Summary (Last 24 hours) at 06/14/2021 0811 Last data filed at 06/14/2021 0600 Gross per 24 hour  Intake 1563.78 ml  Output 3200 ml  Net -1636.22 ml   Filed Weights   06/13/21 0700 06/13/21 2200 06/14/21 0029  Weight: 100.6 kg 99.3 kg 98.9 kg    Physical Exam   General appearance: alert and no distress Neck: JVD - 5 cm above sternal notch, no carotid bruit, and thyroid not enlarged, symmetric, no tenderness/mass/nodules Lungs: diminished breath sounds bibasilar Heart: regular rate and rhythm Abdomen: soft, non-tender; bowel sounds normal; no masses,  no organomegaly and mildly protuberant Extremities: extremities normal, atraumatic, no cyanosis or edema Pulses: 2+ and symmetric Skin: Skin color, texture, turgor normal. No rashes or lesions Neurologic: Grossly  normal Psych: Pleasant  Inpatient Medications    Scheduled Meds:  [START ON 06/15/2021] aspirin EC  81 mg Oral Daily   feeding supplement  237 mL Oral BID BM   furosemide  80 mg Intravenous BID   influenza vaccine adjuvanted  0.5 mL Intramuscular Tomorrow-1000   multivitamin with minerals  1 tablet Oral Daily   pneumococcal 23 valent vaccine  0.5 mL Intramuscular Tomorrow-1000   simvastatin  10 mg Oral QHS   sodium chloride flush  3 mL Intravenous Q12H   sodium chloride flush  3 mL Intravenous Q12H    Continuous Infusions:  sodium chloride     sodium chloride 10 mL/hr at 06/14/21 0521   heparin 1,200 Units/hr (06/14/21 0201)    PRN Meds: sodium chloride, acetaminophen **OR** acetaminophen, sodium chloride flush   Labs   Results for orders placed or performed during the hospital encounter of 06/12/21 (from the past 48 hour(s))  CBC     Status: Abnormal   Collection Time: 06/12/21 10:30 AM  Result Value Ref Range   WBC 5.8 4.0 - 10.5 K/uL   RBC 5.90 (H) 4.22 - 5.81 MIL/uL   Hemoglobin 15.8 13.0 - 17.0 g/dL   HCT 48.9 39.0 - 52.0 %   MCV 82.9 80.0 - 100.0 fL   MCH 26.8 26.0 - 34.0 pg   MCHC 32.3 30.0 - 36.0 g/dL   RDW 16.8 (H) 11.5 - 15.5 %   Platelets 130 (L) 150 -  400 K/uL    Comment: REPEATED TO VERIFY   nRBC 0.0 0.0 - 0.2 %    Comment: Performed at [facility], [address] [place], [place] [address]  Comprehensive metabolic panel     Status: Abnormal   Collection Time: 06/12/21 10:30 AM  Result Value Ref Range   Sodium 139 135 - 145 mmol/L   Potassium 3.6 3.5 - 5.1 mmol/L   Chloride 101 98 - 111 mmol/L   CO2 29 22 - 32 mmol/L   Glucose, Bld 92 70 - 99 mg/dL    Comment: Glucose reference range applies only to samples taken after fasting for at least 8 hours.   BUN 17 8 - 23 mg/dL   Creatinine, Ser 1.46 (H) 0.61 - 1.24 mg/dL   Calcium 9.4 8.9 - 10.3 mg/dL   Total Protein 6.9 6.5 - 8.1 g/dL   Albumin 3.8 3.5 - 5.0 g/dL   AST 23 15 - 41  U/L   ALT 21 0 - 44 U/L   Alkaline Phosphatase 83 38 - 126 U/L   Total Bilirubin 2.8 (H) 0.3 - 1.2 mg/dL   GFR, Estimated 51 (L) >60 mL/min    Comment: (NOTE) Calculated using the CKD-EPI Creatinine Equation (2021)    Anion gap 9 5 - 15    Comment: Performed at [facility], [place], [place] [address]  Troponin I (High Sensitivity)     Status: Abnormal   Collection Time: 06/12/21 10:30 AM  Result Value Ref Range   Troponin I (High Sensitivity) 22 (H) <18 ng/L    Comment: (NOTE) Elevated high sensitivity troponin I (hsTnI) values and significant  changes across serial measurements may suggest ACS but many other  chronic and acute conditions are known to elevate hsTnI results.  Refer to the "Links" section for chest pain algorithms and additional  guidance. Performed at [facility], [address] [place], [place] [address]   Brain natriuretic peptide     Status: Abnormal   Collection Time: 06/12/21 10:30 AM  Result Value Ref Range   B Natriuretic Peptide 1,445.1 (H) 0.0 - 100.0 pg/mL    Comment: Performed at [facility], [place], [place] [address]  D-dimer, quantitative     Status: None   Collection Time: 06/12/21 10:30 AM  Result Value Ref Range   D-Dimer, Quant <0.27 0.00 - 0.50 ug/mL-FEU    Comment: (NOTE) At the manufacturer cut-off value of 0.5 g/mL FEU, this assay has a negative predictive value of 95-100%.This assay is intended for use in conjunction with a clinical pretest probability (PTP) assessment model to exclude pulmonary embolism (PE) and deep venous thrombosis (DVT) in outpatients suspected of PE or DVT. Results should be correlated with clinical presentation. Performed at [facility], [address] [place], [place] [address]   CBG monitoring, ED     Status: None   Collection Time: 06/12/21 10:37 AM  Result Value Ref Range    Glucose-Capillary 99 70 - 99 mg/dL    Comment: Glucose reference range applies only to samples taken after fasting for at least 8 hours.  Resp Panel by RT-PCR (Flu A&B, Covid) Nasopharyngeal Swab     Status: None   Collection Time: 06/12/21 11:58 AM   Specimen: Nasopharyngeal Swab; Nasopharyngeal(NP) swabs in vial transport medium  Result Value Ref Range   SARS Coronavirus 2 by RT PCR NEGATIVE NEGATIVE    Comment: (NOTE) SARS-CoV-2 target nucleic acids are NOT DETECTED.  The SARS-CoV-2 RNA is generally detectable in upper respiratory specimens during the acute phase of infection. The lowest concentration of SARS-CoV-2 viral copies this assay can detect is 138 copies/mL. A negative result does not preclude SARS-Cov-2 infection and should not be used as the sole basis for treatment or other patient management decisions. A negative result may occur with  improper specimen collection/handling, submission of specimen other than nasopharyngeal swab, presence of viral mutation(s) within the areas targeted by this assay, and inadequate number of viral copies(<138 copies/mL). A negative result must be combined with clinical observations, patient history, and epidemiological information. The expected result is Negative.  Fact Sheet for Patients:  EntrepreneurPulse.com.au  Fact Sheet for Healthcare Providers:  IncredibleEmployment.be  This test is no t yet approved or cleared by the [place] FDA and  has been authorized for detection and/or diagnosis of SARS-CoV-2 by FDA under an Emergency Use Authorization (EUA). This EUA will remain  in effect (meaning this test can be used) for the duration of the COVID-19 declaration under Section 564(b)(1) of the Act, 21 U.S.C.section 360bbb-3(b)(1), unless the authorization is terminated  or revoked sooner.       Influenza A by PCR NEGATIVE NEGATIVE   Influenza B by PCR NEGATIVE NEGATIVE    Comment:  (NOTE) The Xpert Xpress SARS-CoV-2/FLU/RSV plus assay is intended as an aid in the diagnosis of influenza from Nasopharyngeal swab specimens and should not be used as a sole basis for treatment. Nasal washings and aspirates are unacceptable for Xpert Xpress SARS-CoV-2/FLU/RSV testing.  Fact Sheet for Patients: EntrepreneurPulse.com.au  Fact Sheet for Healthcare Providers: IncredibleEmployment.be  This test is not yet approved or cleared by the [place] FDA and has been authorized for detection and/or diagnosis of SARS-CoV-2 by FDA under an Emergency Use Authorization (EUA). This EUA will remain in effect (meaning this test can be used) for the duration of the COVID-19 declaration under Section 564(b)(1) of the Act, 21 U.S.C. section 360bbb-3(b)(1), unless the authorization is terminated or revoked.  Performed at [facility], [address] [place], [place] [address]   Urinalysis, Routine w reflex microscopic Urine, Clean Catch     Status: Abnormal   Collection Time: 06/12/21  1:42 PM  Result Value Ref Range   Color, Urine YELLOW YELLOW   APPearance CLEAR CLEAR   Specific Gravity, Urine 1.023 1.005 - 1.030   pH 5.5 5.0 - 8.0   Glucose, UA NEGATIVE NEGATIVE mg/dL   Hgb urine dipstick NEGATIVE NEGATIVE   Bilirubin Urine NEGATIVE NEGATIVE   Ketones, ur NEGATIVE NEGATIVE mg/dL   Protein, ur 30 (A) NEGATIVE mg/dL   Nitrite NEGATIVE NEGATIVE   Leukocytes,Ua LARGE (A) NEGATIVE   RBC / HPF 0-5 0 - 5 RBC/hpf   WBC, UA 11-20 0 - 5 WBC/hpf   Squamous Epithelial / LPF 0-5 0 - 5   Mucus PRESENT    Hyaline Casts, UA PRESENT     Comment: Performed at [facility], [place], [place] [address]  Troponin I (High Sensitivity)     Status: Abnormal   Collection Time: 06/12/21  1:42 PM  Result Value Ref Range   Troponin I (High Sensitivity) 24 (H) <18 ng/L    Comment: (NOTE) Elevated high  sensitivity troponin I (hsTnI) values and significant  changes across serial measurements may suggest ACS but many other  chronic and acute conditions are known to elevate hsTnI results.  Refer to the "Links" section for chest pain algorithms and additional  guidance. Performed at [facility], [address] [place], [place] [address]   Hepatic function panel     Status: Abnormal   Collection Time: 06/12/21  6:39 PM  Result Value Ref Range   Total Protein 7.3 6.5 - 8.1 g/dL   Albumin 3.7 3.5 - 5.0 g/dL   AST 27 15 - 41 U/L   ALT 25 0 - 44 U/L   Alkaline Phosphatase 85 38 - 126 U/L   Total Bilirubin 3.0 (H) 0.3 - 1.2 mg/dL   Bilirubin, Direct 0.6 (H) 0.0 - 0.2 mg/dL   Indirect Bilirubin 2.4 (H) 0.3 - 0.9 mg/dL    Comment: Performed at [facility], [place] [address] [place], [place] [address]  Troponin I (High Sensitivity)     Status: Abnormal   Collection Time: 06/12/21  6:39 PM  Result Value Ref Range   Troponin I (High Sensitivity) 21 (H) <18 ng/L    Comment: (NOTE) Elevated high sensitivity troponin I (hsTnI) values and significant  changes across serial measurements may suggest ACS but many other  chronic and acute conditions are known to elevate hsTnI results.  Refer to the "Links" section for chest pain algorithms and additional  guidance. Performed at [facility], [place] [address], [place], [place] [address]   Magnesium     Status: None   Collection Time: 06/12/21  6:39 PM  Result Value Ref Range   Magnesium 2.3 1.7 - 2.4 mg/dL    Comment: Performed at [facility], [place] [address] [place], [place] [address]  CBC     Status: Abnormal   Collection Time: 06/13/21  3:36 AM  Result Value Ref Range   WBC 6.7 4.0 - 10.5 K/uL   RBC 5.19 4.22 - 5.81 MIL/uL   Hemoglobin 14.3 13.0 - 17.0 g/dL   HCT 43.8 39.0 - 52.0 %   MCV 84.4 80.0 - 100.0 fL   MCH 27.6 26.0 - 34.0 pg   MCHC 32.6 30.0 - 36.0 g/dL    RDW 16.3 (H) 11.5 - 15.5 %   Platelets 129 (L) 150 - 400 K/uL    Comment: REPEATED TO VERIFY   nRBC 0.0 0.0 - 0.2 %    Comment: Performed at [facility], [place] [address] [place], [place] [address]  Comprehensive metabolic panel     Status: Abnormal   Collection Time: 06/13/21  3:36 AM  Result Value Ref Range   Sodium 139 135 - 145 mmol/L   Potassium 3.8 3.5 - 5.1 mmol/L   Chloride 103 98 - 111 mmol/L   CO2 27 22 - 32 mmol/L   Glucose, Bld 89 70 - 99 mg/dL    Comment: Glucose reference range applies only to samples taken after fasting for at least 8 hours.   BUN 18 8 - 23 mg/dL   Creatinine, Ser 1.48 (H) 0.61 - 1.24 mg/dL   Calcium 9.1 8.9 - 10.3 mg/dL   Total Protein 6.2 (L) 6.5 - 8.1 g/dL   Albumin 3.3 (L) 3.5 - 5.0 g/dL   AST 23 15 - 41 U/L   ALT 12 0 - 44 U/L   Alkaline Phosphatase 76 38 - 126 U/L   Total Bilirubin 2.6 (H) 0.3 - 1.2 mg/dL   GFR, Estimated 50 (L) >60 mL/min    Comment: (NOTE) Calculated using the CKD-EPI Creatinine Equation (2021)    Anion gap 9 5 - 15    Comment: Performed at [facility],  [address] [place], [place] [address]  °APTT     Status: Abnormal  ° Collection Time: 06/14/21 12:48 AM  °Result Value Ref Range  ° aPTT 133 (H) 24 - 36 seconds  °  Comment:        °IF BASELINE aPTT IS ELEVATED, °SUGGEST PATIENT RISK ASSESSMENT °BE USED TO DETERMINE APPROPRIATE °ANTICOAGULANT THERAPY. °Performed at [facility], [address] [place], [place] [address] °  °Heparin level (unfractionated)     Status: Abnormal  ° Collection Time: 06/14/21 12:48 AM  °Result Value Ref Range  ° Heparin Unfractionated >1.10 (H) 0.30 - 0.70 IU/mL  °  Comment: (NOTE) °The clinical reportable range upper limit is being lowered to >1.10 °to align with the FDA approved guidance for the current laboratory °assay. ° °If heparin results are below expected values, and patient dosage has  °been confirmed, suggest follow up testing of antithrombin  III levels. °Performed at [facility], [address] [place], [place] °[address] °  °Renal function panel     Status: Abnormal  ° Collection Time: 06/14/21  2:09 AM  °Result Value Ref Range  ° Sodium 138 135 - 145 mmol/L  ° Potassium 3.4 (L) 3.5 - 5.1 mmol/L  ° Chloride 104 98 - 111 mmol/L  ° CO2 29 22 - 32 mmol/L  ° Glucose, Bld 87 70 - 99 mg/dL  °  Comment: Glucose reference range applies only to samples taken after fasting for at least 8 hours.  ° BUN 16 8 - 23 mg/dL  ° Creatinine, Ser 1.48 (H) 0.61 - 1.24 mg/dL  ° Calcium 8.4 (L) 8.9 - 10.3 mg/dL  ° Phosphorus 3.7 2.5 - 4.6 mg/dL  ° Albumin 3.0 (L) 3.5 - 5.0 g/dL  ° GFR, Estimated 50 (L) >60 mL/min  °  Comment: (NOTE) °Calculated using the CKD-EPI Creatinine Equation (2021) °  ° Anion gap 5 5 - 15  °  Comment: Performed at [facility], [address] [place], [place] [address]  °CBC     Status: Abnormal  ° Collection Time: 06/14/21  2:09 AM  °Result Value Ref Range  ° WBC 7.2 4.0 - 10.5 K/uL  ° RBC 5.28 4.22 - 5.81 MIL/uL  ° Hemoglobin 14.2 13.0 - 17.0 g/dL  ° HCT 44.4 39.0 - 52.0 %  ° MCV 84.1 80.0 - 100.0 fL  ° MCH 26.9 26.0 - 34.0 pg  ° MCHC 32.0 30.0 - 36.0 g/dL  ° RDW 16.2 (H) 11.5 - 15.5 %  ° Platelets 115 (L) 150 - 400 K/uL  °  Comment: REPEATED TO VERIFY °PLATELET COUNT CONFIRMED BY SMEAR °  ° nRBC 0.0 0.0 - 0.2 %  °  Comment: Performed at [facility], [address] [place], [place] [address]  °Magnesium     Status: None  ° Collection Time: 06/14/21  2:09 AM  °Result Value Ref Range  ° Magnesium 2.2 1.7 - 2.4 mg/dL  °  Comment: Performed at [facility], [address] [place],  [address]  °TSH     Status: None  ° Collection Time: 06/14/21  2:09 AM  °Result Value Ref Range  ° TSH 3.168 0.350 - 4.500 uIU/mL  °  Comment: Performed by a 3rd Generation assay with a functional sensitivity of <=0.01 uIU/mL. °Performed at [facility], [address] [place],  [address] °  °Basic metabolic panel     Status:  Abnormal  °   Collection Time: 06/14/21  4:47 AM  Result Value Ref Range   Sodium 137 135 - 145 mmol/L   Potassium 3.5 3.5 - 5.1 mmol/L   Chloride 102 98 - 111 mmol/L   CO2 29 22 - 32 mmol/L   Glucose, Bld 92 70 - 99 mg/dL    Comment: Glucose reference range applies only to samples taken after fasting for at least 8 hours.   BUN 15 8 - 23 mg/dL   Creatinine, Ser 1.53 (H) 0.61 - 1.24 mg/dL   Calcium 8.8 (L) 8.9 - 10.3 mg/dL   GFR, Estimated 48 (L) >60 mL/min    Comment: (NOTE) Calculated using the CKD-EPI Creatinine Equation (2021)    Anion gap 6 5 - 15    Comment: Performed at [facility] [address], [place], [place] [address]    ECG   N/A  Telemetry   Sinus rhythm with PVCs, short 6 beat run of NSVT - Personally Reviewed  Radiology    DG Chest Port 1 View  Result Date: 06/12/2021 CLINICAL DATA:  Shortness of breath EXAM: PORTABLE CHEST 1 VIEW COMPARISON:  05/24/2021 FINDINGS: Unchanged position of left chest cardiac device and lead. Unchanged cardiomegaly. Unchanged mediastinal contours. Unchanged vascular congestion without overt pulmonary edema. No focal pulmonary opacity. No definite pleural effusion. No acute osseous abnormality. IMPRESSION: Cardiomegaly with vascular congestion but without overt pulmonary edema. Electronically Signed   By: Merilyn Baba M.D.   On: 06/12/2021 12:03   ECHOCARDIOGRAM COMPLETE  Result Date: 06/13/2021    ECHOCARDIOGRAM REPORT   Patient Name:   Evan Serrano Date of Exam: 06/13/2021 Medical Rec #:  [id number]        Height:       68.0 in Accession #:    [id number]       Weight:       221.8 lb Date of Birth:  11/08/1950       BSA:          2.136 m Patient Age:    50 years         BP:           104/87 mmHg Patient Gender: M                HR:           72 bpm. Exam Location:  Inpatient Procedure: 2D Echo, Cardiac Doppler and Color Doppler Indications:    CHF  History:        Patient has prior history of Echocardiogram examinations, most                  recent 01/13/2021. Arrythmias:Atrial Fibrillation,                 Signs/Symptoms:Syncope and Dyspnea; Risk Factors:Hypertension.                 VTACH/ pacer device.  Sonographer:    Beryle Beams Referring Phys: [id number] [place]  1. Left ventricular ejection fraction, by estimation, is 20 to 25%. The left ventricle has severely decreased function. The left ventricle demonstrates global hypokinesis. The left ventricular internal cavity size was severely dilated. Left ventricular diastolic parameters are consistent with Grade III diastolic dysfunction (restrictive). Elevated left ventricular end-diastolic pressure.  2. Right ventricular systolic function is low normal. The right ventricular size is normal. There is moderately elevated pulmonary artery systolic pressure. The estimated right ventricular systolic pressure is 123456 mmHg.  3. Left atrial size  was severely dilated.  4. Right atrial size was severely dilated.  5. The mitral valve is abnormal. Mild to moderate mitral valve regurgitation.  6. The tricuspid valve is abnormal. Tricuspid valve regurgitation is moderate.  7. The aortic valve is tricuspid. Aortic valve regurgitation is mild. Aortic valve sclerosis/calcification is present, without any evidence of aortic stenosis.  8. Aortic dilatation noted. There is borderline dilatation of the ascending aorta, measuring 39 mm.  9. The inferior vena cava is dilated in size with <50% respiratory variability, suggesting right atrial pressure of 15 mmHg. Comparison(s): Changes from prior study are noted. 01/13/2021: LVEF 20-25%, global HK, grade 3 DD, elevated LVEDP. FINDINGS  Left Ventricle: Left ventricular ejection fraction, by estimation, is 20 to 25%. The left ventricle has severely decreased function. The left ventricle demonstrates global hypokinesis. The left ventricular internal cavity size was severely dilated. There is no left ventricular hypertrophy. Left ventricular  diastolic parameters are consistent with Grade III diastolic dysfunction (restrictive). Elevated left ventricular end-diastolic pressure. Right Ventricle: The right ventricular size is normal. No increase in right ventricular wall thickness. Right ventricular systolic function is low normal. There is moderately elevated pulmonary artery systolic pressure. The tricuspid regurgitant velocity  is 3.16 m/s, and with an assumed right atrial pressure of 15 mmHg, the estimated right ventricular systolic pressure is 123456 mmHg. Left Atrium: Left atrial size was severely dilated. Right Atrium: Right atrial size was severely dilated. Pericardium: There is no evidence of pericardial effusion. Mitral Valve: The mitral valve is abnormal. There is mild thickening of the anterior and posterior mitral valve leaflet(s). Mild to moderate mitral valve regurgitation. MV peak gradient, 35.3 mmHg. The mean mitral valve gradient is 28.0 mmHg. Tricuspid Valve: The tricuspid valve is abnormal. Tricuspid valve regurgitation is moderate. Aortic Valve: The aortic valve is tricuspid. Aortic valve regurgitation is mild. Aortic regurgitation PHT measures 825 msec. Aortic valve sclerosis/calcification is present, without any evidence of aortic stenosis. Aortic valve mean gradient measures 2.0  mmHg. Aortic valve peak gradient measures 3.7 mmHg. Aortic valve area, by VTI measures 2.04 cm. Pulmonic Valve: The pulmonic valve was grossly normal. Pulmonic valve regurgitation is trivial. Aorta: Aortic dilatation noted. There is borderline dilatation of the ascending aorta, measuring 39 mm. Venous: The inferior vena cava is dilated in size with less than 50% respiratory variability, suggesting right atrial pressure of 15 mmHg. IAS/Shunts: No atrial level shunt detected by color flow Doppler. Additional Comments: A device lead is visualized.  LEFT VENTRICLE PLAX 2D LVIDd:         7.00 cm      Diastology LVIDs:         6.50 cm      LV e' medial:    6.31  cm/s LV PW:         1.00 cm      LV E/e' medial:  14.1 LV IVS:        0.90 cm      LV e' lateral:   7.51 cm/s LVOT diam:     1.80 cm      LV E/e' lateral: 11.9 LV SV:         36 LV SV Index:   17 LVOT Area:     2.54 cm  LV Volumes (MOD) LV vol d, MOD A2C: 203.0 ml LV vol d, MOD A4C: 204.0 ml LV vol s, MOD A2C: 151.0 ml LV vol s, MOD A4C: 136.0 ml LV SV MOD A2C:     52.0 ml LV  SV MOD A4C:     204.0 ml LV SV MOD BP:      61.4 ml RIGHT VENTRICLE             IVC RV S prime:     10.30 cm/s  IVC diam: 2.30 cm TAPSE (M-mode): 2.0 cm LEFT ATRIUM              Index        RIGHT ATRIUM           Index LA diam:        5.30 cm  2.48 cm/m   RA Area:     37.40 cm LA Vol (A2C):   131.0 ml 61.34 ml/m  RA Volume:   162.00 ml 75.86 ml/m LA Vol (A4C):   128.0 ml 59.94 ml/m LA Biplane Vol: 130.0 ml 60.87 ml/m  AORTIC VALVE                    PULMONIC VALVE AV Area (Vmax):    1.85 cm     PV Vmax:       0.40 m/s AV Area (Vmean):   1.84 cm     PV Peak grad:  0.6 mmHg AV Area (VTI):     2.04 cm AV Vmax:           96.10 cm/s AV Vmean:          66.500 cm/s AV VTI:            0.175 m AV Peak Grad:      3.7 mmHg AV Mean Grad:      2.0 mmHg LVOT Vmax:         69.80 cm/s LVOT Vmean:        48.000 cm/s LVOT VTI:          0.140 m LVOT/AV VTI ratio: 0.80 AI PHT:            825 msec  AORTA Ao Root diam: 2.80 cm Ao Asc diam:  3.90 cm MITRAL VALVE               TRICUSPID VALVE MV Area VTI:  0.29 cm     TV Peak grad:   48.7 mmHg MV Peak grad: 35.3 mmHg    TV Mean grad:   32.0 mmHg MV Mean grad: 28.0 mmHg    TV Vmax:        3.49 m/s MV Vmax:      2.97 m/s     TV Vmean:       266.0 cm/s MV Vmean:     260.0 cm/s   TV VTI:         1.30 msec MV E velocity: 89.12 cm/s  TR Peak grad:   39.9 mmHg                            TR Vmax:        316.00 cm/s                             SHUNTS                            Systemic VTI:  0.14 m  Systemic Diam: 1.80 cm Lyman Bishop MD Electronically signed by Lyman Bishop MD Signature  Date/Time: 06/13/2021/5:40:25 PM    Final     Cardiac Studies   See echo above  Assessment   Principal Problem:   CHF exacerbation ([place] [place]) Active Problems:   Orthostatic hypotension   Renal insufficiency   Implantable cardioverter-defibrillator (ICD) in situ   Hyperlipidemia   Obstructive sleep apnea syndrome   Plan   Good diuresis overnight- creatinine essentially stable. Will continue given volume overload by echo - plan L/RHC today to evaluate decline in LVEF, recent VT s/p AICD shock and filling pressures/cardiac output. Discussed the risks, benefits and alteratives as well as the procedure in depth with him and he is willing to proceed. BP remains soft, little room to uptitrate HF meds at this time.  Time Spent Directly with Patient:  I have spent a total of 25 minutes with the patient reviewing hospital notes, telemetry, EKGs, labs and examining the patient as well as establishing an assessment and plan that was discussed personally with the patient.  > 50% of time was spent in direct patient care.  Length of Stay:  LOS: 2 days   Pixie Casino, MD, [facility], [place] Director of the [facility] &  Cardiovascular Risk Reduction Clinic Diplomate of the American Board of Clinical Lipidology Attending Cardiologist  Direct Dial: [phone]   Fax: [phone]  Website:  [email or web address] 06/14/2021, 8:11 AM

## 2021-06-15 DIAGNOSIS — I5023 Acute on chronic systolic (congestive) heart failure: Secondary | ICD-10-CM | POA: Diagnosis not present

## 2021-06-15 DIAGNOSIS — I130 Hypertensive heart and chronic kidney disease with heart failure and stage 1 through stage 4 chronic kidney disease, or unspecified chronic kidney disease: Secondary | ICD-10-CM | POA: Diagnosis not present

## 2021-06-15 LAB — CBC
HCT: 47.8 % (ref 39.0–52.0)
Hemoglobin: 15.4 g/dL (ref 13.0–17.0)
MCH: 26.7 pg (ref 26.0–34.0)
MCHC: 32.2 g/dL (ref 30.0–36.0)
MCV: 83 fL (ref 80.0–100.0)
Platelets: 142 10*3/uL — ABNORMAL LOW (ref 150–400)
RBC: 5.76 MIL/uL (ref 4.22–5.81)
RDW: 16.4 % — ABNORMAL HIGH (ref 11.5–15.5)
WBC: 6.6 10*3/uL (ref 4.0–10.5)
nRBC: 0 % (ref 0.0–0.2)

## 2021-06-15 LAB — LIPID PANEL
Cholesterol: 108 mg/dL (ref 0–200)
HDL: 27 mg/dL — ABNORMAL LOW (ref >40)
LDL Cholesterol: 71 mg/dL (ref 0–99)
Total CHOL/HDL Ratio: 4 RATIO
Triglycerides: 52 mg/dL (ref <150)
VLDL: 10 mg/dL (ref 0–40)

## 2021-06-15 LAB — BASIC METABOLIC PANEL
Anion gap: 12 (ref 5–15)
BUN: 15 mg/dL (ref 8–23)
CO2: 29 mmol/L (ref 22–32)
Calcium: 9 mg/dL (ref 8.9–10.3)
Chloride: 98 mmol/L (ref 98–111)
Creatinine, Ser: 1.33 mg/dL — ABNORMAL HIGH (ref 0.61–1.24)
GFR, Estimated: 57 mL/min — ABNORMAL LOW (ref >60)
Glucose, Bld: 78 mg/dL (ref 70–99)
Potassium: 3.3 mmol/L — ABNORMAL LOW (ref 3.5–5.1)
Sodium: 139 mmol/L (ref 135–145)

## 2021-06-15 MED ORDER — POTASSIUM CHLORIDE CRYS ER 20 MEQ PO TBCR
40.0000 meq | EXTENDED_RELEASE_TABLET | Freq: Two times a day (BID) | ORAL | Status: DC
  Administered 2021-06-16 – 2021-06-17 (×3): 40 meq via ORAL
  Filled 2021-06-15 (×3): qty 2

## 2021-06-15 MED ORDER — APIXABAN 5 MG PO TABS
5.0000 mg | ORAL_TABLET | Freq: Two times a day (BID) | ORAL | Status: DC
  Administered 2021-06-15 – 2021-06-17 (×5): 5 mg via ORAL
  Filled 2021-06-15 (×5): qty 1

## 2021-06-15 MED ORDER — ATORVASTATIN CALCIUM 40 MG PO TABS
40.0000 mg | ORAL_TABLET | Freq: Every day | ORAL | Status: DC
  Administered 2021-06-15 – 2021-06-17 (×3): 40 mg via ORAL
  Filled 2021-06-15 (×3): qty 1

## 2021-06-15 MED ORDER — POTASSIUM CHLORIDE CRYS ER 20 MEQ PO TBCR: 40.0000 meq | EXTENDED_RELEASE_TABLET | Freq: Two times a day (BID) | ORAL | Status: DC

## 2021-06-15 MED ORDER — POTASSIUM CHLORIDE CRYS ER 20 MEQ PO TBCR
40.0000 meq | EXTENDED_RELEASE_TABLET | Freq: Once | ORAL | Status: AC
Start: 2021-06-15 — End: 2021-06-15
  Administered 2021-06-15: 40 meq via ORAL
  Filled 2021-06-15: qty 2

## 2021-06-15 MED ORDER — POTASSIUM CHLORIDE CRYS ER 20 MEQ PO TBCR
40.0000 meq | EXTENDED_RELEASE_TABLET | Freq: Once | ORAL | Status: AC
  Administered 2021-06-15: 40 meq via ORAL
  Filled 2021-06-15: qty 2

## 2021-06-15 NOTE — Progress Notes (Signed)
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  BNP elevated to 1400.  CXR consistent with CHF.  Received IV Lasix 20 mg in ED and became hypotensive with systolic in [id number] and 0000000.  Cardiology consulted and recommended transfer to [facility] for further care and evaluation. Pt underwent cardiac catheterization showing non obstructive CAD. Pt is being diuresed .  Assessment & Plan:   Principal Problem:   CHF exacerbation ([place]) Active Problems:   Orthostatic hypotension   Renal insufficiency   Implantable cardioverter-defibrillator (ICD) in situ   Hyperlipidemia   Obstructive sleep apnea syndrome     Acute on Chronic respiratory failure on 2 lit/min.  - secondary to  acute on chronic systolic and  diastolic heart failure.  She appears to be volume overloaded. - CXR showing Cardiomegaly with vascular congestion but without overt pulmonary edema. - Echocardiogram showed Left ventricular ejection fraction is 20 to 25%,  has severely decreased function. The left ventricle demonstrates global hypokinesis. The left ventricular internal cavity size was severely dilated. Left ventricular  diastolic parameters are consistent with Grade III diastolic dysfunction (restrictive).  -IV lasix 80 mg BID.  - continue with strict intake and output, daily weights and monitor renal parameters while on IV Lasix. - creatinine is improving  with IV diuresis. Pt diuresed about 4.7 lit.  Continue with iIV lasix 80 mg BID.     Hypokalemia  Replaced.  Recheck in am.    PAF:  - rate controlled. On Eliquis for anti coagulation.    Nonischemic cardiomyopathy S/p ICD for VF Continue to monitor.  Obstructive sleep apnea on CPAP  Hyperlipidemia:  Resume lipitor.    Elevated troponins  Demand ischemia from CHF.    Stage 3b CKD:  Creatinine improving.   Essential hypertension Blood pressure parameters appear to be on the softer side. Continue to monitor.  DVT prophylaxis: Eliquis  Code Status: full code.  Family Communication: none at bedside.  Disposition:   Status is: Inpatient  Remains inpatient appropriate because: IV diuresis.        Consultants:  Cardiology.   Procedures: Echo L/R CATH.  Antimicrobials: None.   Subjective: Breathing  is much better today remains on 3lit of [place] oxygen, coughing better.   Objective: Vitals:   06/15/21 0012 06/15/21 0414 06/15/21 0802 06/15/21 1153  BP: 101/77 108/84 (!) 105/91   Pulse:   87   Resp: 20 (!) 21 19   Temp: 98.6 F (37 C) 98.7 F (37.1 C) 97.9 F (36.6 C) 97.7 F (36.5 C)  TempSrc: Oral Oral Oral Oral  SpO2: 99% 98% 99%   Weight:  96.3 kg    Height:        Intake/Output Summary (Last 24 hours) at 06/15/2021 1421 Last data filed at 06/15/2021 1221 Gross per 24 hour  Intake 720 ml  Output 2275 ml  Net -1555 ml    Filed Weights   06/13/21 2200 06/14/21 0029 06/15/21 0414  Weight: 99.3 kg 98.9 kg 96.3 kg  Examination:  General exam: Appears calm and comfortable  Respiratory system: on 3lit of [place] Oxygen. Diminished air entry.  Cardiovascular system: S1 & S2 heard, RRR.  No pedal edema. Gastrointestinal system: Abdomen is nondistended, soft and nontender. Normal bowel sounds heard. Central nervous system: Alert and oriented. No focal neurological deficits. Extremities: Symmetric 5 x 5 power. Skin: No rashes, lesions or ulcers Psychiatry: Mood & affect appropriate.      Data Reviewed: I have personally  reviewed following labs and imaging studies  CBC: Recent Labs  Lab 06/12/21 1030 06/13/21 0336 06/14/21 0209 06/14/21 1650 06/14/21 1837 06/15/21 0353  WBC 5.8 6.7 7.2  --  7.2 6.6  HGB 15.8 14.3 14.2 15.3   17.0 16.2 15.4  HCT 48.9 43.8 44.4 45.0   50.0 50.1 47.8  MCV 82.9 84.4 84.1  --  83.9 83.0  PLT 130* 129* 115*  --  PLATELET CLUMPS NOTED ON SMEAR, UNABLE TO ESTIMATE 142*     Basic Metabolic Panel: Recent Labs  Lab 06/12/21 1030 06/12/21 1839 06/13/21 0336 06/14/21 0209 06/14/21 0447 06/14/21 1650 06/14/21 1837 06/15/21 0353  NA 139  --  139 138 137 125*   142  --  139  K 3.6  --  3.8 3.4* 3.5 3.5   3.5  --  3.3*  CL 101  --  103 104 102  --   --  98  CO2 29  --  27 29 29   --   --  29  GLUCOSE 92  --  89 87 92  --   --  78  BUN 17  --  18 16 15   --   --  15  CREATININE 1.46*  --  1.48* 1.48* 1.53*  --  1.43* 1.33*  CALCIUM 9.4  --  9.1 8.4* 8.8*  --   --  9.0  MG  --  2.3  --  2.2  --   --   --   --   PHOS  --   --   --  3.7  --   --   --   --      GFR: Estimated Creatinine Clearance: 57.4 mL/min (A) (by C-G formula based on SCr of 1.33 mg/dL (H)).  Liver Function Tests: Recent Labs  Lab 06/12/21 1030 06/12/21 1839 06/13/21 0336 06/14/21 0209  AST 23 27 23   --   ALT 21 25 12   --   ALKPHOS 83 85 76  --   BILITOT 2.8* 3.0* 2.6*  --   PROT 6.9 7.3 6.2*  --   ALBUMIN 3.8 3.7 3.3* 3.0*     CBG: Recent Labs  Lab 06/12/21 1037  GLUCAP 99      Recent Results (from the past 240 hour(s))  Resp Panel by RT-PCR (Flu A&B, Covid) Nasopharyngeal Swab     Status: None   Collection Time: 06/12/21 11:58 AM   Specimen: Nasopharyngeal Swab; Nasopharyngeal(NP) swabs in vial transport medium  Result Value Ref Range Status   SARS Coronavirus 2 by RT PCR NEGATIVE NEGATIVE Final    Comment: (NOTE) SARS-CoV-2 target nucleic acids are NOT DETECTED.  The SARS-CoV-2 RNA is generally detectable in upper respiratory specimens during the acute phase of  infection. The lowest concentration of SARS-CoV-2 viral copies this assay can detect is 138 copies/mL. A negative result does not preclude SARS-Cov-2 infection and should not be used as the sole basis for treatment or other patient management decisions. A negative result may occur with  improper specimen collection/handling, submission of specimen other than nasopharyngeal swab, presence of viral mutation(s) within the areas targeted by this assay, and inadequate number of viral copies(<138 copies/mL). A negative result must be combined with clinical observations, patient history, and epidemiological information. The expected result is Negative.  Fact Sheet for Patients:  EntrepreneurPulse.com.au  Fact Sheet for Healthcare Providers:  IncredibleEmployment.be  This test is no t yet approved or cleared by the [place] FDA and  has been authorized for detection and/or diagnosis of SARS-CoV-2 by FDA under an Emergency Use Authorization (EUA). This EUA will remain  in effect (meaning this test can be used) for the duration of the COVID-19 declaration under Section 564(b)(1) of the Act, 21 U.S.C.section 360bbb-3(b)(1), unless the authorization is terminated  or revoked sooner.       Influenza A by PCR NEGATIVE NEGATIVE Final   Influenza B by PCR NEGATIVE NEGATIVE Final    Comment: (NOTE) The Xpert Xpress SARS-CoV-2/FLU/RSV plus assay is intended as an aid in the diagnosis of influenza from Nasopharyngeal swab specimens and should not be used as a sole basis for treatment. Nasal washings and aspirates are unacceptable for Xpert Xpress SARS-CoV-2/FLU/RSV testing.  Fact Sheet for Patients: EntrepreneurPulse.com.au  Fact Sheet for Healthcare Providers: IncredibleEmployment.be  This test is not yet approved or cleared by the [place] FDA and has been authorized for detection and/or diagnosis of SARS-CoV-2  by FDA under an Emergency Use Authorization (EUA). This EUA will remain in effect (meaning this test can be used) for the duration of the COVID-19 declaration under Section 564(b)(1) of the Act, 21 U.S.C. section 360bbb-3(b)(1), unless the authorization is terminated or revoked.  Performed at [facility], [address] [place], [place] [address]           Radiology Studies: CARDIAC CATHETERIZATION  Result Date: 06/14/2021   Dist RCA lesion is 50% stenosed.   Mid RCA lesion is 50% stenosed. 1.  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  Patent left main, LAD, and left circumflex, with minimal irregularity in each vessel but no significant stenosis 3.  Mildly elevated right heart pressures with mean pulmonary artery pressure 38, RA pressure of 9, pulmonary capillary wedge pressure 19 mmHg. Recommend: Patient appears to have severe nonischemic cardiomyopathy, continue heart failure treatments per primary team        Scheduled Meds:  apixaban  5 mg Oral BID   aspirin EC  81 mg Oral Daily   atorvastatin  40 mg Oral Daily   feeding supplement  237 mL Oral BID BM   furosemide  80 mg Intravenous BID   multivitamin with minerals  1 tablet Oral Daily   [START ON 06/16/2021] potassium chloride  40 mEq Oral BID   sodium chloride flush  3 mL Intravenous Q12H   sodium chloride flush  3 mL Intravenous Q12H   Continuous Infusions:  sodium chloride       LOS: 3 days        Hosie Poisson, MD [facility]   To contact the attending provider between 7A-7P or the covering provider during after hours 7P-7A, please log into the web site [email or web address] and access using universal [place] password for that web site. If you do not have the password, please call the hospital operator.  06/15/2021, 2:21 PM

## 2021-06-15 NOTE — Progress Notes (Addendum)
DAILY PROGRESS NOTE   Patient Name: Evan Serrano Date of Encounter: 06/15/2021 Cardiologist: Pixie Casino, MD  Patient Profile   Evan Serrano is a 71 y.o. male with a hx of HFrEF, ICD in place, HTN, orthostatic hypotension, renal insufficiency, PAF on eliquis, and OSA on CPAP who was seen 06/13/2021 for the evaluation of CHF exacerbation at the request of Dr. Cyndia Skeeters.  Subjective   Feels he is breathing at about his baseline, but he is currently on 4 L of oxygen and says at home he is only on 1-1/2.  Says definitely, his breathing is improved from admission.  Did not feel the shock on 12/25, had felt the 3 previous shocks, is happy about that  Objective   Vitals:   06/14/21 2050 06/15/21 0012 06/15/21 0414 06/15/21 0802  BP: 101/82 101/77 108/84 (!) 105/91  Pulse:    87  Resp:  20 (!) 21 19  Temp:  98.6 F (37 C) 98.7 F (37.1 C) 97.9 F (36.6 C)  TempSrc:  Oral Oral Oral  SpO2: 96% 99% 98% 99%  Weight:   96.3 kg   Height:        Intake/Output Summary (Last 24 hours) at 06/15/2021 0846 Last data filed at 06/15/2021 0417 Gross per 24 hour  Intake 240 ml  Output 3125 ml  Net -2885 ml   Filed Weights   06/13/21 2200 06/14/21 0029 06/15/21 0414  Weight: 99.3 kg 98.9 kg 96.3 kg    Physical Exam   GEN: No acute distress.   Neck: JVD 8-9 cm Cardiac: RRR, no murmur, no rubs, or gallops.  Respiratory: diminished to auscultation bilaterally with a few rales in the bases. GI: Soft, nontender, non-distended  MS: No edema; No deformity. Neuro:  Nonfocal  Psych: Normal affect   Inpatient Medications    Scheduled Meds:  apixaban  5 mg Oral BID   aspirin EC  81 mg Oral Daily   feeding supplement  237 mL Oral BID BM   furosemide  80 mg Intravenous BID   influenza vaccine adjuvanted  0.5 mL Intramuscular Tomorrow-1000   multivitamin with minerals  1 tablet Oral Daily   pneumococcal 23 valent vaccine  0.5 mL Intramuscular Tomorrow-1000   potassium chloride   40 mEq Oral Once   simvastatin  10 mg Oral QHS   sodium chloride flush  3 mL Intravenous Q12H   sodium chloride flush  3 mL Intravenous Q12H    Continuous Infusions:  sodium chloride      PRN Meds: sodium chloride, acetaminophen **OR** acetaminophen, ondansetron (ZOFRAN) IV, sodium chloride flush   Labs   Results for orders placed or performed during the hospital encounter of 06/12/21 (from the past 48 hour(s))  APTT     Status: Abnormal   Collection Time: 06/14/21 12:48 AM  Result Value Ref Range   aPTT 133 (H) 24 - 36 seconds    Comment:        IF BASELINE aPTT IS ELEVATED, SUGGEST PATIENT RISK ASSESSMENT BE USED TO DETERMINE APPROPRIATE ANTICOAGULANT THERAPY. Performed at [facility], [place] [address] [place], [place] [address]   Heparin level (unfractionated)     Status: Abnormal   Collection Time: 06/14/21 12:48 AM  Result Value Ref Range   Heparin Unfractionated >1.10 (H) 0.30 - 0.70 IU/mL    Comment: (NOTE) The clinical reportable range upper limit is being lowered to >1.10 to align with the FDA approved guidance for the current laboratory assay.  If heparin  results are below expected values, and patient dosage has  been confirmed, suggest follow up testing of antithrombin III levels. Performed at [facility], [place] [address], [place], [place] [address]   Renal function panel     Status: Abnormal   Collection Time: 06/14/21  2:09 AM  Result Value Ref Range   Sodium 138 135 - 145 mmol/L   Potassium 3.4 (L) 3.5 - 5.1 mmol/L   Chloride 104 98 - 111 mmol/L   CO2 29 22 - 32 mmol/L   Glucose, Bld 87 70 - 99 mg/dL    Comment: Glucose reference range applies only to samples taken after fasting for at least 8 hours.   BUN 16 8 - 23 mg/dL   Creatinine, Ser 1.48 (H) 0.61 - 1.24 mg/dL   Calcium 8.4 (L) 8.9 - 10.3 mg/dL   Phosphorus 3.7 2.5 - 4.6 mg/dL   Albumin 3.0 (L) 3.5 - 5.0 g/dL   GFR, Estimated 50 (L) >60 mL/min    Comment: (NOTE) Calculated using  the CKD-EPI Creatinine Equation (2021)    Anion gap 5 5 - 15    Comment: Performed at [facility] [address], [place], [place] [address]  CBC     Status: Abnormal   Collection Time: 06/14/21  2:09 AM  Result Value Ref Range   WBC 7.2 4.0 - 10.5 K/uL   RBC 5.28 4.22 - 5.81 MIL/uL   Hemoglobin 14.2 13.0 - 17.0 g/dL   HCT 44.4 39.0 - 52.0 %   MCV 84.1 80.0 - 100.0 fL   MCH 26.9 26.0 - 34.0 pg   MCHC 32.0 30.0 - 36.0 g/dL   RDW 16.2 (H) 11.5 - 15.5 %   Platelets 115 (L) 150 - 400 K/uL    Comment: REPEATED TO VERIFY PLATELET COUNT CONFIRMED BY SMEAR    nRBC 0.0 0.0 - 0.2 %    Comment: Performed at [facility], [place] [address], [place], [place] [address]  Magnesium     Status: None   Collection Time: 06/14/21  2:09 AM  Result Value Ref Range   Magnesium 2.2 1.7 - 2.4 mg/dL    Comment: Performed at [facility] [address] [place], [place] [address]  TSH     Status: None   Collection Time: 06/14/21  2:09 AM  Result Value Ref Range   TSH 3.168 0.350 - 4.500 uIU/mL    Comment: Performed by a 3rd Generation assay with a functional sensitivity of <=0.01 uIU/mL. Performed at [facility], [place] [address] [place], [place] [id number]   Basic metabolic panel     Status: Abnormal   Collection Time: 06/14/21  4:47 AM  Result Value Ref Range   Sodium 137 135 - 145 mmol/L   Potassium 3.5 3.5 - 5.1 mmol/L   Chloride 102 98 - 111 mmol/L   CO2 29 22 - 32 mmol/L   Glucose, Bld 92 70 - 99 mg/dL    Comment: Glucose reference range applies only to samples taken after fasting for at least 8 hours.   BUN 15 8 - 23 mg/dL   Creatinine, Ser 1.53 (H) 0.61 - 1.24 mg/dL   Calcium 8.8 (L) 8.9 - 10.3 mg/dL   GFR, Estimated 48 (L) >60 mL/min    Comment: (NOTE) Calculated using the CKD-EPI Creatinine Equation (2021)    Anion gap 6 5 - 15    Comment: Performed at [facility]  [address] [place], [place] [address]  Heparin level (unfractionated)     Status:  Abnormal   Collection Time: 06/14/21 11:14 AM  Result Value Ref Range   Heparin Unfractionated >1.10 (H) 0.30 - 0.70 IU/mL    Comment: (NOTE) The clinical reportable range upper limit is being lowered to >1.10 to align with the FDA approved guidance for the current laboratory assay.  If heparin results are below expected values, and patient dosage has  been confirmed, suggest follow up testing of antithrombin III levels. Performed at [facility], [place] [address], [place], [place] [address]   APTT     Status: Abnormal   Collection Time: 06/14/21 11:14 AM  Result Value Ref Range   aPTT 113 (H) 24 - 36 seconds    Comment:        IF BASELINE aPTT IS ELEVATED, SUGGEST PATIENT RISK ASSESSMENT BE USED TO DETERMINE APPROPRIATE ANTICOAGULANT THERAPY. Performed at [facility], [place] [address] [place], [place] [address]   POCT I-Stat EG7     Status: Abnormal   Collection Time: 06/14/21  4:50 PM  Result Value Ref Range   pH, Ven 7.386 7.250 - 7.430   pCO2, Ven 52.2 44.0 - 60.0 mmHg   pO2, Ven 32.0 32.0 - 45.0 mmHg   Bicarbonate 31.3 (H) 20.0 - 28.0 mmol/L   TCO2 33 (H) 22 - 32 mmol/L   O2 Saturation 58.0 %   Acid-Base Excess 5.0 (H) 0.0 - 2.0 mmol/L   Sodium 142 135 - 145 mmol/L   Potassium 3.5 3.5 - 5.1 mmol/L   Calcium, Ion 1.26 1.15 - 1.40 mmol/L   HCT 50.0 39.0 - 52.0 %   Hemoglobin 17.0 13.0 - 17.0 g/dL   Sample type VENOUS    Comment NOTIFIED PHYSICIAN   I-STAT 7, (LYTES, BLD GAS, ICA, H+H)     Status: Abnormal   Collection Time: 06/14/21  4:50 PM  Result Value Ref Range   pH, Arterial 7.368 7.350 - 7.450   pCO2 arterial 54.0 (H) 32.0 - 48.0 mmHg   pO2, Arterial 67 (L) 83.0 - 108.0 mmHg   Bicarbonate 31.1 (H) 20.0 - 28.0 mmol/L   TCO2 33 (H) 22 - 32 mmol/L   O2 Saturation 92.0 %   Acid-Base Excess 4.0 (H) 0.0 - 2.0 mmol/L   Sodium 125 (L) 135 - 145 mmol/L   Potassium 3.5 3.5 - 5.1 mmol/L   Calcium, Ion 1.20 1.15 - 1.40 mmol/L   HCT 45.0 39.0 - 52.0 %    Hemoglobin 15.3 13.0 - 17.0 g/dL   Sample type ARTERIAL   CBC     Status: Abnormal   Collection Time: 06/14/21  6:37 PM  Result Value Ref Range   WBC 7.2 4.0 - 10.5 K/uL   RBC 5.97 (H) 4.22 - 5.81 MIL/uL   Hemoglobin 16.2 13.0 - 17.0 g/dL   HCT 50.1 39.0 - 52.0 %   MCV 83.9 80.0 - 100.0 fL   MCH 27.1 26.0 - 34.0 pg   MCHC 32.3 30.0 - 36.0 g/dL   RDW 16.4 (H) 11.5 - 15.5 %   Platelets PLATELET CLUMPS NOTED ON SMEAR, UNABLE TO ESTIMATE 150 - 400 K/uL   nRBC 0.0 0.0 - 0.2 %    Comment: Performed at [facility] [address], [place], [place] [address]  Creatinine, serum     Status: Abnormal   Collection Time: 06/14/21  6:37 PM  Result Value Ref Range   Creatinine, Ser 1.43 (  H) 0.61 - 1.24 mg/dL   GFR, Estimated 52 (L) >60 mL/min    Comment: (NOTE) Calculated using the CKD-EPI Creatinine Equation (2021) Performed at [facility], [place] [address], [place], [place] [address]   CBC     Status: Abnormal   Collection Time: 06/15/21  3:53 AM  Result Value Ref Range   WBC 6.6 4.0 - 10.5 K/uL   RBC 5.76 4.22 - 5.81 MIL/uL   Hemoglobin 15.4 13.0 - 17.0 g/dL   HCT 47.8 39.0 - 52.0 %   MCV 83.0 80.0 - 100.0 fL   MCH 26.7 26.0 - 34.0 pg   MCHC 32.2 30.0 - 36.0 g/dL   RDW 16.4 (H) 11.5 - 15.5 %   Platelets 142 (L) 150 - 400 K/uL   nRBC 0.0 0.0 - 0.2 %    Comment: Performed at [facility], [place] [address] [place], [place] [id number]  Basic metabolic panel     Status: Abnormal   Collection Time: 06/15/21  3:53 AM  Result Value Ref Range   Sodium 139 135 - 145 mmol/L   Potassium 3.3 (L) 3.5 - 5.1 mmol/L   Chloride 98 98 - 111 mmol/L   CO2 29 22 - 32 mmol/L   Glucose, Bld 78 70 - 99 mg/dL    Comment: Glucose reference range applies only to samples taken after fasting for at least 8 hours.   BUN 15 8 - 23 mg/dL   Creatinine, Ser 1.33 (H) 0.61 - 1.24 mg/dL   Calcium 9.0 8.9 - 10.3 mg/dL   GFR, Estimated 57 (L) >60 mL/min    Comment: (NOTE) Calculated using the  CKD-EPI Creatinine Equation (2021)    Anion gap 12 5 - 15    Comment: Performed at [facility] [address], [place], [place] [address]   Magnesium  Date Value Ref Range Status  06/14/2021 2.2 1.7 - 2.4 mg/dL Final    Comment:    Performed at [facility] [address] [place], [place] [address]  06/12/2021 2.3 1.7 - 2.4 mg/dL Final    Comment:    Performed at [facility], [place] [address] [place], [place] [address]  05/24/2021 2.1 1.7 - 2.4 mg/dL Final    Comment:    Performed at [facility], [address], [place], [place] [address]   No results found for: CHOL, HDL, LDLCALC, LDLDIRECT, TRIG, CHOLHDL   ECG   N/A  Telemetry   SR, PVCs, prs and bigem - Personally Reviewed  Radiology    CARDIAC CATHETERIZATION  Result Date: 06/14/2021   Dist RCA lesion is 50% stenosed.   Mid RCA lesion is 50% stenosed. 1.  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  Patent left main, LAD, and left circumflex, with minimal irregularity in each vessel but no significant stenosis 3.  Mildly elevated right heart pressures with mean pulmonary artery pressure 38, RA pressure of 9, pulmonary capillary wedge pressure 19 mmHg. 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 Exam Location:  Inpatient Procedure: 2D Echo, Cardiac Doppler and Color Doppler Indications:    CHF  History:        Patient has prior history of  Echocardiogram examinations, most                 recent 01/13/2021. Arrythmias:Atrial Fibrillation,                 Signs/Symptoms:Syncope and Dyspnea; Risk Factors:Hypertension.                 VTACH/ pacer device.  Sonographer:    Beryle Beams Referring Phys: [id number] [place]  1. Left ventricular ejection fraction, by estimation, is 20 to 25%. The left ventricle has severely decreased function. The left ventricle demonstrates global hypokinesis. The left ventricular internal cavity size was severely dilated. Left ventricular diastolic parameters are consistent with Grade III diastolic dysfunction (restrictive). Elevated left ventricular end-diastolic pressure.  2. Right ventricular systolic function is low normal. The right ventricular size is normal. There is moderately elevated pulmonary artery systolic pressure. The estimated right ventricular systolic pressure is 123456 mmHg.  3. Left atrial size was severely dilated.  4. Right atrial size was severely dilated.  5. The mitral valve is abnormal. Mild to moderate mitral valve regurgitation.  6. The tricuspid valve is abnormal. Tricuspid valve regurgitation is moderate.  7. The aortic valve is tricuspid. Aortic valve regurgitation is mild. Aortic valve sclerosis/calcification is present, without any evidence of aortic stenosis.  8. Aortic dilatation noted. There is borderline dilatation of the ascending aorta, measuring 39 mm.  9. The inferior vena cava is dilated in size with <50% respiratory variability, suggesting right atrial pressure of 15 mmHg. Comparison(s): Changes from prior study are noted. 01/13/2021: LVEF 20-25%, global HK, grade 3 DD, elevated LVEDP. FINDINGS  Left Ventricle: Left ventricular ejection fraction, by estimation, is 20 to 25%. The left ventricle has severely decreased function. The left ventricle demonstrates global hypokinesis. The left ventricular internal cavity size was severely dilated. There is no left  ventricular hypertrophy. Left ventricular diastolic parameters are consistent with Grade III diastolic dysfunction (restrictive). Elevated left ventricular end-diastolic pressure. Right Ventricle: The right ventricular size is normal. No increase in right ventricular wall thickness. Right ventricular systolic function is low normal. There is moderately elevated pulmonary artery systolic pressure. The tricuspid regurgitant velocity  is 3.16 m/s, and with an assumed right atrial pressure of 15 mmHg, the estimated right ventricular systolic pressure is 123456 mmHg. Left Atrium: Left atrial size was severely dilated. Right Atrium: Right atrial size was severely dilated. Pericardium: There is no evidence of pericardial effusion. Mitral Valve: The mitral valve is abnormal. There is mild thickening of the anterior and posterior mitral valve leaflet(s). Mild to moderate mitral valve regurgitation. MV peak gradient, 35.3 mmHg. The mean mitral valve gradient is 28.0 mmHg. Tricuspid Valve: The tricuspid valve is abnormal. Tricuspid valve regurgitation is moderate. Aortic Valve: The aortic valve is tricuspid. Aortic valve regurgitation is mild. Aortic regurgitation PHT  measures 825 msec. Aortic valve sclerosis/calcification is present, without any evidence of aortic stenosis. Aortic valve mean gradient measures 2.0  mmHg. Aortic valve peak gradient measures 3.7 mmHg. Aortic valve area, by VTI measures 2.04 cm. Pulmonic Valve: The pulmonic valve was grossly normal. Pulmonic valve regurgitation is trivial. Aorta: Aortic dilatation noted. There is borderline dilatation of the ascending aorta, measuring 39 mm. Venous: The inferior vena cava is dilated in size with less than 50% respiratory variability, suggesting right atrial pressure of 15 mmHg. IAS/Shunts: No atrial level shunt detected by color flow Doppler. Additional Comments: A device lead is visualized.  LEFT VENTRICLE PLAX 2D LVIDd:         7.00 cm      Diastology LVIDs:          6.50 cm      LV e' medial:    6.31 cm/s LV PW:         1.00 cm      LV E/e' medial:  14.1 LV IVS:        0.90 cm      LV e' lateral:   7.51 cm/s LVOT diam:     1.80 cm      LV E/e' lateral: 11.9 LV SV:         36 LV SV Index:   17 LVOT Area:     2.54 cm  LV Volumes (MOD) LV vol d, MOD A2C: 203.0 ml LV vol d, MOD A4C: 204.0 ml LV vol s, MOD A2C: 151.0 ml LV vol s, MOD A4C: 136.0 ml LV SV MOD A2C:     52.0 ml LV SV MOD A4C:     204.0 ml LV SV MOD BP:      61.4 ml RIGHT VENTRICLE             IVC RV S prime:     10.30 cm/s  IVC diam: 2.30 cm TAPSE (M-mode): 2.0 cm LEFT ATRIUM              Index        RIGHT ATRIUM           Index LA diam:        5.30 cm  2.48 cm/m   RA Area:     37.40 cm LA Vol (A2C):   131.0 ml 61.34 ml/m  RA Volume:   162.00 ml 75.86 ml/m LA Vol (A4C):   128.0 ml 59.94 ml/m LA Biplane Vol: 130.0 ml 60.87 ml/m  AORTIC VALVE                    PULMONIC VALVE AV Area (Vmax):    1.85 cm     PV Vmax:       0.40 m/s AV Area (Vmean):   1.84 cm     PV Peak grad:  0.6 mmHg AV Area (VTI):     2.04 cm AV Vmax:           96.10 cm/s AV Vmean:          66.500 cm/s AV VTI:            0.175 m AV Peak Grad:      3.7 mmHg AV Mean Grad:      2.0 mmHg LVOT Vmax:         69.80 cm/s LVOT Vmean:        48.000 cm/s LVOT VTI:          0.140 m LVOT/AV VTI  ratio: 0.80 AI PHT:            825 msec  AORTA Ao Root diam: 2.80 cm Ao Asc diam:  3.90 cm MITRAL VALVE               TRICUSPID VALVE MV Area VTI:  0.29 cm     TV Peak grad:   48.7 mmHg MV Peak grad: 35.3 mmHg    TV Mean grad:   32.0 mmHg MV Mean grad: 28.0 mmHg    TV Vmax:        3.49 m/s MV Vmax:      2.97 m/s     TV Vmean:       266.0 cm/s MV Vmean:     260.0 cm/s   TV VTI:         1.30 msec MV E velocity: 89.12 cm/s  TR Peak grad:   39.9 mmHg                            TR Vmax:        316.00 cm/s                             SHUNTS                            Systemic VTI:  0.14 m                            Systemic Diam: 1.80 cm Lyman Bishop MD  Electronically signed by Lyman Bishop MD Signature Date/Time: 06/13/2021/5:40:25 PM    Final     Cardiac Studies   CARDIAC CATH: 01/13   Dist RCA lesion is 50% stenosed.   Mid RCA lesion is 50% stenosed.   1.  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  Patent left main, LAD, and left circumflex, with minimal irregularity in each vessel but no significant stenosis 3.  Mildly elevated right heart pressures with mean pulmonary artery pressure 38, RA pressure of 9, pulmonary capillary wedge pressure 19 mmHg.   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 Left ventricular ejection fraction, by estimation, is 20 to 25%. The  left ventricle has severely decreased function. The left ventricle  demonstrates global hypokinesis. The left ventricular internal cavity size  was severely dilated. Left ventricular  diastolic parameters are consistent with Grade III diastolic dysfunction  (restrictive). Elevated left ventricular end-diastolic pressure.   2. Right ventricular systolic function is low normal. The right  ventricular size is normal. There is moderately elevated pulmonary artery  systolic pressure. The estimated right ventricular systolic pressure is  123456 mmHg.   3. Left atrial size was severely dilated.   4. Right atrial size was severely dilated.   5. The mitral valve is abnormal. Mild to moderate mitral valve  regurgitation.   6. The tricuspid valve is abnormal. Tricuspid valve regurgitation is  moderate.   7. The aortic valve is tricuspid. Aortic valve regurgitation is mild.  Aortic valve sclerosis/calcification is present, without any evidence of  aortic stenosis.   8. Aortic dilatation noted. There is borderline dilatation of the  ascending aorta, measuring 39 mm.   9. The inferior vena  cava is dilated in size with <50% respiratory   variability, suggesting right atrial pressure of 15 mmHg.   Comparison(s): Changes from prior study are noted. 01/13/2021: LVEF 20-25%,  global HK, grade 3 DD, elevated LVEDP.   Assessment   Principal Problem:   CHF exacerbation ([place]) Active Problems:   Orthostatic hypotension   Renal insufficiency   Implantable cardioverter-defibrillator (ICD) in situ   Hyperlipidemia   Obstructive sleep apnea syndrome   Plan   Wt down to 212 lbs, still w/ some volume on board - continue diuresis - will give Kdur 40 meq bid - vent ectopy increasing, suspect 2nd low K+, need to keep K+ > 4 - Mg checked 01/13 and was 2.1, no supp needed - cath w/ non-obs dz >> aggressive CRF but BP limits rx - will change Zocor to Lipitor 40 mg qd, ck profile in am - BUN stable, Cr trending down - follow on telemetry   Length of Stay:  LOS: 3 days    Rosaria Ferries 06/15/2021, 8:46 AM  History and all data above reviewed.  Patient examined.  I agree with the findings as above.  Breathing better.  Not quite at baseline but he is maintaining sats in the mid 90s as I talk to him.  The patient exam reveals COR:RRR  ,  Lungs: Decreased breath sounds.  No crackles  ,  Abd: Positive bowel sounds, no rebound no guarding, Ext No edema   .  All available labs, radiology testing, previous records reviewed. Agree with documented assessment and plan. Acute systolic HF:  Agree with continued diuresis.  Creat is improved.   Possibly change to PO in the morning.   Jeneen Rinks Alfretta Pinch  12:54 PM  06/15/2021

## 2021-06-15 NOTE — Progress Notes (Signed)
ANTICOAGULATION CONSULT NOTE Pharmacy Consult for Heparin  Indication: atrial fibrillation  No Known Allergies  Patient Measurements: Height: 5\' 8"  (172.7 cm) Weight: 96.3 kg (212 lb 4.8 oz) IBW/kg (Calculated) : 68.4 Heparin Dosing Weight: 89.7 kg  Vital Signs: Temp: 97.9 F (36.6 C) (01/14 0802) Temp Source: Oral (01/14 0802) BP: 105/91 (01/14 0802) Pulse Rate: 87 (01/14 0802)  Labs: Recent Labs    06/12/21 1030 06/12/21 1342 06/12/21 1839 06/13/21 0336 06/14/21 0048 06/14/21 0209 06/14/21 0447 06/14/21 1114 06/14/21 1650 06/14/21 1837 06/15/21 0353  HGB 15.8  --   --    < >  --  14.2  --   --  15.3   17.0 16.2 15.4  HCT 48.9  --   --    < >  --  44.4  --   --  45.0   50.0 50.1 47.8  PLT 130*  --   --    < >  --  115*  --   --   --  PLATELET CLUMPS NOTED ON SMEAR, UNABLE TO ESTIMATE 142*  APTT  --   --   --   --  133*  --   --  113*  --   --   --   HEPARINUNFRC  --   --   --   --  >1.10*  --   --  >1.10*  --   --   --   CREATININE 1.46*  --   --    < >  --  1.48* 1.53*  --   --  1.43* 1.33*  TROPONINIHS 22* 24* 21*  --   --   --   --   --   --   --   --    < > = values in this interval not displayed.     Estimated Creatinine Clearance: 57.4 mL/min (A) (by C-G formula based on SCr of 1.33 mg/dL (H)).  Assessment: 71 y.o. male with h/o Afib, Eliquis PTA,  transitioned to IV heparin infusion. Pharmacy now consulted to transition back to Eliquis.  Will resume home regimen.  Goal of Therapy:  Heparin level 0.3-0.7 units/ml Heparin level 66-102 units/ml Monitor platelets by anticoagulation protocol: Yes   Plan:  Start Eliquis 5 mg BID Monitor s/s of bleeding  Thank you for allowing pharmacy to participate in this patient's care.  Reatha Harps, PharmD PGY1 Pharmacy Resident 06/15/2021 9:17 AM Check [email or web address] for unit specific pharmacy number

## 2021-06-16 DIAGNOSIS — I5021 Acute systolic (congestive) heart failure: Secondary | ICD-10-CM | POA: Diagnosis not present

## 2021-06-16 LAB — CBC
HCT: 49.7 % (ref 39.0–52.0)
Hemoglobin: 16.5 g/dL (ref 13.0–17.0)
MCH: 27.5 pg (ref 26.0–34.0)
MCHC: 33.2 g/dL (ref 30.0–36.0)
MCV: 82.7 fL (ref 80.0–100.0)
Platelets: 150 10*3/uL (ref 150–400)
RBC: 6.01 MIL/uL — ABNORMAL HIGH (ref 4.22–5.81)
RDW: 16.3 % — ABNORMAL HIGH (ref 11.5–15.5)
WBC: 8.2 10*3/uL (ref 4.0–10.5)
nRBC: 0 % (ref 0.0–0.2)

## 2021-06-16 LAB — BASIC METABOLIC PANEL
Anion gap: 12 (ref 5–15)
BUN: 17 mg/dL (ref 8–23)
CO2: 29 mmol/L (ref 22–32)
Calcium: 9.5 mg/dL (ref 8.9–10.3)
Chloride: 98 mmol/L (ref 98–111)
Creatinine, Ser: 1.36 mg/dL — ABNORMAL HIGH (ref 0.61–1.24)
GFR, Estimated: 56 mL/min — ABNORMAL LOW (ref >60)
Glucose, Bld: 85 mg/dL (ref 70–99)
Potassium: 4 mmol/L (ref 3.5–5.1)
Sodium: 139 mmol/L (ref 135–145)

## 2021-06-16 MED ORDER — GUAIFENESIN-DM 100-10 MG/5ML PO SYRP
5.0000 mL | ORAL_SOLUTION | ORAL | Status: DC | PRN
  Administered 2021-06-16: 5 mL via ORAL
  Filled 2021-06-16: qty 5

## 2021-06-16 NOTE — Progress Notes (Signed)
SATURATION QUALIFICATIONS: (This note is used to comply with regulatory documentation for home oxygen)  Patient Saturations on Room Air at Rest = 93%  Patient Saturations on Room Air while Ambulating = 87%  Patient Saturations on 1 Liters of oxygen while Ambulating = 91%

## 2021-06-16 NOTE — Plan of Care (Signed)
Problem: Pain Managment: Goal: General experience of comfort will improve Outcome: Completed/Met   Problem: Coping: Goal: Level of anxiety will decrease Outcome: Completed/Met   Problem: Nutrition: Goal: Adequate nutrition will be maintained Outcome: Completed/Met   Problem: Activity: Goal: Risk for activity intolerance will decrease Outcome: Completed/Met   Problem: Clinical Measurements: Goal: Will remain free from infection Outcome: Completed/Met

## 2021-06-16 NOTE — Progress Notes (Addendum)
DAILY PROGRESS NOTE   Patient Name: Evan Serrano Date of Encounter: 06/16/2021 Cardiologist: Pixie Casino, MD  Patient Profile   Evan Serrano is a 71 y.o. male with a hx of HFrEF, ICD in place, HTN, orthostatic hypotension, renal insufficiency, PAF on eliquis, and OSA on CPAP who was seen 06/13/2021 for the evaluation of CHF exacerbation at the request of Dr. Cyndia Skeeters.  Subjective   Feels that he is improving every day, but still gets short of breath with minimal exertion such as conversation.  Still requiring O2 at 4 L to maintain sats  Objective   Vitals:   06/16/21 0004 06/16/21 0348 06/16/21 0735 06/16/21 0741  BP: 100/79 101/78 99/85   Pulse:   100   Resp: 17 19 (!) 21 18  Temp: 97.9 F (36.6 C) 98.5 F (36.9 C) 97.8 F (36.6 C)   TempSrc: Oral Oral Oral   SpO2: 92% 100%    Weight:  96.2 kg    Height:        Intake/Output Summary (Last 24 hours) at 06/16/2021 0824 Last data filed at 06/16/2021 0741 Gross per 24 hour  Intake 1300 ml  Output 1675 ml  Net -375 ml   Filed Weights   06/14/21 0029 06/15/21 0414 06/16/21 0348  Weight: 98.9 kg 96.3 kg 96.2 kg    Physical Exam   GEN: No acute distress.   Neck: mild JVD Cardiac: RRR, no murmur, no rubs, or gallops.  Respiratory: few rales in the bases, minimal wheeze GI: Soft, nontender, non-distended  MS: No edema; No deformity. Neuro:  Nonfocal  Psych: Normal affect   Inpatient Medications    Scheduled Meds:  apixaban  5 mg Oral BID   aspirin EC  81 mg Oral Daily   atorvastatin  40 mg Oral Daily   feeding supplement  237 mL Oral BID BM   furosemide  80 mg Intravenous BID   multivitamin with minerals  1 tablet Oral Daily   potassium chloride  40 mEq Oral BID   sodium chloride flush  3 mL Intravenous Q12H   sodium chloride flush  3 mL Intravenous Q12H    Continuous Infusions:  sodium chloride      PRN Meds: sodium chloride, acetaminophen **OR** acetaminophen, ondansetron (ZOFRAN) IV,  sodium chloride flush   Labs   Results for orders placed or performed during the hospital encounter of 06/12/21 (from the past 48 hour(s))  Heparin level (unfractionated)     Status: Abnormal   Collection Time: 06/14/21 11:14 AM  Result Value Ref Range   Heparin Unfractionated >1.10 (H) 0.30 - 0.70 IU/mL    Comment: (NOTE) The clinical reportable range upper limit is being lowered to >1.10 to align with the FDA approved guidance for the current laboratory assay.  If heparin results are below expected values, and patient dosage has  been confirmed, suggest follow up testing of antithrombin III levels. Performed at [facility], [place] [address], [place], [place] [address]   APTT     Status: Abnormal   Collection Time: 06/14/21 11:14 AM  Result Value Ref Range   aPTT 113 (H) 24 - 36 seconds    Comment:        IF BASELINE aPTT IS ELEVATED, SUGGEST PATIENT RISK ASSESSMENT BE USED TO DETERMINE APPROPRIATE ANTICOAGULANT THERAPY. Performed at [facility], [place] [address], [place], [place] [address]   POCT I-Stat EG7     Status: Abnormal   Collection Time: 06/14/21  4:50  PM  Result Value Ref Range   pH, Ven 7.386 7.250 - 7.430   pCO2, Ven 52.2 44.0 - 60.0 mmHg   pO2, Ven 32.0 32.0 - 45.0 mmHg   Bicarbonate 31.3 (H) 20.0 - 28.0 mmol/L   TCO2 33 (H) 22 - 32 mmol/L   O2 Saturation 58.0 %   Acid-Base Excess 5.0 (H) 0.0 - 2.0 mmol/L   Sodium 142 135 - 145 mmol/L   Potassium 3.5 3.5 - 5.1 mmol/L   Calcium, Ion 1.26 1.15 - 1.40 mmol/L   HCT 50.0 39.0 - 52.0 %   Hemoglobin 17.0 13.0 - 17.0 g/dL   Sample type VENOUS    Comment NOTIFIED PHYSICIAN   I-STAT 7, (LYTES, BLD GAS, ICA, H+H)     Status: Abnormal   Collection Time: 06/14/21  4:50 PM  Result Value Ref Range   pH, Arterial 7.368 7.350 - 7.450   pCO2 arterial 54.0 (H) 32.0 - 48.0 mmHg   pO2, Arterial 67 (L) 83.0 - 108.0 mmHg   Bicarbonate 31.1 (H) 20.0 - 28.0 mmol/L   TCO2 33 (H) 22 - 32 mmol/L   O2 Saturation  92.0 %   Acid-Base Excess 4.0 (H) 0.0 - 2.0 mmol/L   Sodium 125 (L) 135 - 145 mmol/L   Potassium 3.5 3.5 - 5.1 mmol/L   Calcium, Ion 1.20 1.15 - 1.40 mmol/L   HCT 45.0 39.0 - 52.0 %   Hemoglobin 15.3 13.0 - 17.0 g/dL   Sample type ARTERIAL   CBC     Status: Abnormal   Collection Time: 06/14/21  6:37 PM  Result Value Ref Range   WBC 7.2 4.0 - 10.5 K/uL   RBC 5.97 (H) 4.22 - 5.81 MIL/uL   Hemoglobin 16.2 13.0 - 17.0 g/dL   HCT 50.1 39.0 - 52.0 %   MCV 83.9 80.0 - 100.0 fL   MCH 27.1 26.0 - 34.0 pg   MCHC 32.3 30.0 - 36.0 g/dL   RDW 16.4 (H) 11.5 - 15.5 %   Platelets PLATELET CLUMPS NOTED ON SMEAR, UNABLE TO ESTIMATE 150 - 400 K/uL   nRBC 0.0 0.0 - 0.2 %    Comment: Performed at [facility] [address], [place], [place] [address]  Creatinine, serum     Status: Abnormal   Collection Time: 06/14/21  6:37 PM  Result Value Ref Range   Creatinine, Ser 1.43 (H) 0.61 - 1.24 mg/dL   GFR, Estimated 52 (L) >60 mL/min    Comment: (NOTE) Calculated using the CKD-EPI Creatinine Equation (2021) Performed at [facility] [address], [place], [place] [address]   CBC     Status: Abnormal   Collection Time: 06/15/21  3:53 AM  Result Value Ref Range   WBC 6.6 4.0 - 10.5 K/uL   RBC 5.76 4.22 - 5.81 MIL/uL   Hemoglobin 15.4 13.0 - 17.0 g/dL   HCT 47.8 39.0 - 52.0 %   MCV 83.0 80.0 - 100.0 fL   MCH 26.7 26.0 - 34.0 pg   MCHC 32.2 30.0 - 36.0 g/dL   RDW 16.4 (H) 11.5 - 15.5 %   Platelets 142 (L) 150 - 400 K/uL   nRBC 0.0 0.0 - 0.2 %    Comment: Performed at [facility], [place] [address], [place], [place] [id number]  Basic metabolic panel     Status: Abnormal   Collection Time: 06/15/21  3:53 AM  Result Value Ref Range   Sodium 139 135 - 145  mmol/L   Potassium 3.3 (L) 3.5 - 5.1 mmol/L   Chloride 98 98 - 111 mmol/L   CO2 29 22 - 32 mmol/L   Glucose, Bld 78 70 - 99 mg/dL    Comment: Glucose reference range applies only to samples taken after fasting for at least 8  hours.   BUN 15 8 - 23 mg/dL   Creatinine, Ser 1.33 (H) 0.61 - 1.24 mg/dL   Calcium 9.0 8.9 - 10.3 mg/dL   GFR, Estimated 57 (L) >60 mL/min    Comment: (NOTE) Calculated using the CKD-EPI Creatinine Equation (2021)    Anion gap 12 5 - 15    Comment: Performed at [facility] [address], [place], [place] [address]  Lipid panel     Status: Abnormal   Collection Time: 06/15/21  3:53 AM  Result Value Ref Range   Cholesterol 108 0 - 200 mg/dL   Triglycerides 52 <150 mg/dL   HDL 27 (L) >40 mg/dL   Total CHOL/HDL Ratio 4.0 RATIO   VLDL 10 0 - 40 mg/dL   LDL Cholesterol 71 0 - 99 mg/dL    Comment:        Total Cholesterol/HDL:CHD Risk Coronary Heart Disease Risk Table                     Men   Women  1/2 Average Risk   3.4   3.3  Average Risk       5.0   4.4  2 X Average Risk   9.6   7.1  3 X Average Risk  23.4   11.0        Use the calculated Patient Ratio above and the CHD Risk Table to determine the patient's CHD Risk.        ATP III CLASSIFICATION (LDL):  <100     mg/dL   Optimal  100-129  mg/dL   Near or Above                    Optimal  130-159  mg/dL   Borderline  160-189  mg/dL   High  >190     mg/dL   Very High Performed at [facility] [address] [place], [place] [address]   CBC     Status: Abnormal   Collection Time: 06/16/21  3:14 AM  Result Value Ref Range   WBC 8.2 4.0 - 10.5 K/uL   RBC 6.01 (H) 4.22 - 5.81 MIL/uL   Hemoglobin 16.5 13.0 - 17.0 g/dL   HCT 49.7 39.0 - 52.0 %   MCV 82.7 80.0 - 100.0 fL   MCH 27.5 26.0 - 34.0 pg   MCHC 33.2 30.0 - 36.0 g/dL   RDW 16.3 (H) 11.5 - 15.5 %   Platelets 150 150 - 400 K/uL   nRBC 0.0 0.0 - 0.2 %    Comment: Performed at [facility], [place] [address] [place], [place] [id number]  Basic metabolic panel     Status: Abnormal   Collection Time: 06/16/21  3:14 AM  Result Value Ref Range   Sodium 139 135 - 145 mmol/L   Potassium 4.0 3.5 - 5.1 mmol/L    Comment: DELTA CHECK NOTED   Chloride  98 98 - 111 mmol/L   CO2 29 22 - 32 mmol/L   Glucose, Bld 85 70 - 99 mg/dL    Comment: Glucose reference range applies only to samples taken after fasting  for at least 8 hours.   BUN 17 8 - 23 mg/dL   Creatinine, Ser 0.25 (H) 0.61 - 1.24 mg/dL   Calcium 9.5 8.9 - 42.7 mg/dL   GFR, Estimated 56 (L) >60 mL/min    Comment: (NOTE) Calculated using the CKD-EPI Creatinine Equation (2021)    Anion gap 12 5 - 15    Comment: Performed at [facility], [address], [place], [place] [address]   Magnesium  Date Value Ref Range Status  06/14/2021 2.2 1.7 - 2.4 mg/dL Final    Comment:    Performed at [facility], [address], [place], [place] [address]  06/12/2021 2.3 1.7 - 2.4 mg/dL Final    Comment:    Performed at [facility], [address], [place], [place] [address]  05/24/2021 2.1 1.7 - 2.4 mg/dL Final    Comment:    Performed at [facility], [address], [place], [place] [address]   Lab Results  Component Value Date   CHOL 108 06/15/2021   HDL 27 (L) 06/15/2021   LDLCALC 71 06/15/2021   TRIG 52 06/15/2021   CHOLHDL 4.0 06/15/2021     ECG   N/A  Telemetry   Sinus rhythm, PVCs and salvos, one 5 beat run of NSVT`- Personally Reviewed  Radiology    CARDIAC CATHETERIZATION  Result Date: 06/14/2021   Dist RCA lesion is 50% stenosed.   Mid RCA lesion is 50% stenosed. 1.  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  Patent left main, LAD, and left circumflex, with minimal irregularity in each vessel but no significant stenosis 3.  Mildly elevated right heart pressures with mean pulmonary artery pressure 38, RA pressure of 9, pulmonary capillary wedge pressure 19 mmHg. Recommend: Patient appears to have severe nonischemic cardiomyopathy, continue heart failure treatments per primary team    Cardiac Studies   CARDIAC CATH: 01/13   Dist RCA lesion is 50% stenosed.   Mid  RCA lesion is 50% stenosed.   1.  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  Patent left main, LAD, and left circumflex, with minimal irregularity in each vessel but no significant stenosis 3.  Mildly elevated right heart pressures with mean pulmonary artery pressure 38, RA pressure of 9, pulmonary capillary wedge pressure 19 mmHg.   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 Left ventricular ejection fraction, by estimation, is 20 to 25%. The  left ventricle has severely decreased function. The left ventricle  demonstrates global hypokinesis. The left ventricular internal cavity size  was severely dilated. Left ventricular  diastolic parameters are consistent with Grade III diastolic dysfunction  (restrictive). Elevated left ventricular end-diastolic pressure.   2. Right ventricular systolic function is low normal. The right  ventricular size is normal. There is moderately elevated pulmonary artery  systolic pressure. The estimated right ventricular systolic pressure is  54.9 mmHg.   3. Left atrial size was severely dilated.   4. Right atrial size was severely dilated.   5. The mitral valve is abnormal. Mild to moderate mitral valve  regurgitation.   6. The tricuspid valve is abnormal. Tricuspid valve regurgitation is  moderate.   7. The aortic valve is tricuspid. Aortic valve regurgitation is mild.  Aortic valve sclerosis/calcification is present, without any evidence of  aortic stenosis.  8. Aortic dilatation noted. There is borderline dilatation of the  ascending aorta, measuring 39 mm.   9. The inferior vena cava is dilated in size with <50% respiratory  variability, suggesting right atrial pressure of 15 mmHg.   Comparison(s): Changes from prior study are noted. 01/13/2021: LVEF 20-25%,  global HK, grade 3 DD, elevated LVEDP.    Assessment   Principal Problem:   CHF exacerbation (HCC) Active Problems:   Orthostatic hypotension   Renal insufficiency   Implantable cardioverter-defibrillator (ICD) in situ   Hyperlipidemia   Obstructive sleep apnea syndrome   Plan   No change in wt overnight -212 pounds, this should be at or close to his dry weight, based on previous values -  I/O/net -615 mL last 24 hours, net -5.3 L since admit -Continue diuresis this a.m. -Ambulate and see how tolerated since he is still on 4 L of O2 and was on 2 L at home -Although he had a 5 beat run of VT, his ventricular ectopy was much less with a potassium of 4.0, continue supplement -SBP 99 this a.m., asymptomatic, the only BP lowering medication he is on is Lasix -Lipitor 20 mg daily changed to Zocor 40 mg daily, but LDL is only 71 - Discharge statin dose per MD -BUN/creatinine up slightly from yesterday, but creatinine decreased from the peak of 1.53, continue to follow `   Length of Stay:  LOS: 4 days    Rosaria Ferries 06/16/2021, 8:24 AM  History and all data above reviewed.  Patient examined.  I agree with the findings as above.  The patient exam reveals COR:RRR  ,  Lungs: Clear  ,  Abd: Positive bowel sounds, no rebound no guarding, Ext No edema  .  All available labs, radiology testing, previous records reviewed. Agree with documented assessment and plan.   Breathing is better but not at baseline.  I agree with above.  Since he is tolerating the IV Lasix we can continue again today.  He will ambulate but wants to be in bed for the [employer].   Jeneen Rinks Jewett Mcgann  10:49 AM  06/16/2021

## 2021-06-16 NOTE — Progress Notes (Signed)
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  BNP elevated to 1400.  CXR consistent with CHF.  Received IV Lasix 20 mg in ED and became hypotensive with systolic in [id number] and 0000000.  Cardiology consulted and recommended transfer to [facility] for further care and evaluation. Pt underwent cardiac catheterization showing non obstructive CAD. Pt is being diuresed . Pt reports he uses about 2 lit of [place] oxygen at home, will check ambulating oxygen levels on discharge.   Assessment & Plan:   Principal Problem:   CHF exacerbation ([place]) Active Problems:   Orthostatic hypotension   Renal insufficiency   Implantable cardioverter-defibrillator (ICD) in situ   Hyperlipidemia   Obstructive sleep apnea syndrome     Acute on Chronic respiratory failure on 2 lit/min.  - secondary to  acute on chronic systolic and  diastolic heart failure.   appears to be volume overloaded. - CXR showing Cardiomegaly with vascular congestion but without overt pulmonary edema. - Echocardiogram showed Left ventricular ejection fraction is 20 to 25%,  has severely decreased function. The left ventricle demonstrates global hypokinesis. The left ventricular internal cavity size was severely dilated. Left ventricular  diastolic parameters are consistent with Grade III diastolic dysfunction (restrictive).  -IV lasix 80 mg BID. Continue the same.  - continue with strict intake and output, daily weights and monitor renal parameters while on IV  Lasix. - creatinine is improving  with IV diuresis. Pt diuresed about 5.7 lit.     Hypokalemia  Replaced.  Repeat level around 4   PAF:  - rate controlled. On Eliquis for anti coagulation.    Nonischemic cardiomyopathy S/p ICD for VF Continue to monitor.  Obstructive sleep apnea on CPAP  Hyperlipidemia:  Resume lipitor.    Elevated troponins  Demand ischemia from CHF.    Stage 3b CKD:  Creatinine improving.  Creatinine around 1.3   Essential hypertension Blood pressure parameters appear to be on the softer side. Continue to monitor.  DVT prophylaxis: Eliquis  Code Status: full code.  Family Communication: none at bedside.  Disposition:   Status is: Inpatient  Remains inpatient appropriate because: IV diuresis.        Consultants:  Cardiology.   Procedures: Echo L/R CATH.  Antimicrobials: None.   Subjective: Patient reports that he was on 2 L of nasal cannula oxygen at home and still feels short of breath.  He is currently requiring between 3 to 4 L at rest recommend checking ambulating oxygen levels prior to discharge.  Objective: Vitals:   06/16/21 0004 06/16/21 0348 06/16/21 0735 06/16/21 0741  BP: 100/79 101/78 99/85   Pulse:   100   Resp: 17 19 (!) 21 18  Temp: 97.9 F (36.6 C) 98.5 F (36.9 C) 97.8 F (36.6 C)   TempSrc: Oral Oral Oral   SpO2: 92% 100%    Weight:  96.2 kg    Height:        Intake/Output Summary (Last 24 hours) at 06/16/2021 1105 Last data filed  at 06/16/2021 1016 Gross per 24 hour  Intake 1060 ml  Output 2150 ml  Net -1090 ml    Filed Weights   06/14/21 0029 06/15/21 0414 06/16/21 0348  Weight: 98.9 kg 96.3 kg 96.2 kg    Examination:  General exam: Appears calm and comfortable  Respiratory system: Clear to auscultation. Respiratory effort normal. Cardiovascular system: S1 & S2 heard, RRR.  No pedal edema. Gastrointestinal system: Abdomen is nondistended, soft and nontender. Normal bowel sounds  heard. Central nervous system: Alert and oriented. No focal neurological deficits. Extremities: Symmetric 5 x 5 power. Skin: No rashes, lesions or ulcers Psychiatry: Mood & affect appropriate.       Data Reviewed: I have personally reviewed following labs and imaging studies  CBC: Recent Labs  Lab 06/13/21 0336 06/14/21 0209 06/14/21 1650 06/14/21 1837 06/15/21 0353 06/16/21 0314  WBC 6.7 7.2  --  7.2 6.6 8.2  HGB 14.3 14.2 15.3   17.0 16.2 15.4 16.5  HCT 43.8 44.4 45.0   50.0 50.1 47.8 49.7  MCV 84.4 84.1  --  83.9 83.0 82.7  PLT 129* 115*  --  PLATELET CLUMPS NOTED ON SMEAR, UNABLE TO ESTIMATE 142* 150     Basic Metabolic Panel: Recent Labs  Lab 06/12/21 1839 06/13/21 0336 06/14/21 0209 06/14/21 0447 06/14/21 1650 06/14/21 1837 06/15/21 0353 06/16/21 0314  NA  --  139 138 137 125*   142  --  139 139  K  --  3.8 3.4* 3.5 3.5   3.5  --  3.3* 4.0  CL  --  103 104 102  --   --  98 98  CO2  --  27 29 29   --   --  29 29  GLUCOSE  --  89 87 92  --   --  78 85  BUN  --  18 16 15   --   --  15 17  CREATININE  --  1.48* 1.48* 1.53*  --  1.43* 1.33* 1.36*  CALCIUM  --  9.1 8.4* 8.8*  --   --  9.0 9.5  MG 2.3  --  2.2  --   --   --   --   --   PHOS  --   --  3.7  --   --   --   --   --      GFR: Estimated Creatinine Clearance: 56 mL/min (A) (by C-G formula based on SCr of 1.36 mg/dL (H)).  Liver Function Tests: Recent Labs  Lab 06/12/21 1030 06/12/21 1839 06/13/21 0336 06/14/21 0209  AST 23 27 23   --   ALT 21 25 12   --   ALKPHOS 83 85 76  --   BILITOT 2.8* 3.0* 2.6*  --   PROT 6.9 7.3 6.2*  --   ALBUMIN 3.8 3.7 3.3* 3.0*     CBG: Recent Labs  Lab 06/12/21 1037  GLUCAP 99      Recent Results (from the past 240 hour(s))  Resp Panel by RT-PCR (Flu A&B, Covid) Nasopharyngeal Swab     Status: None   Collection Time: 06/12/21 11:58 AM   Specimen: Nasopharyngeal Swab; Nasopharyngeal(NP) swabs in vial transport medium  Result Value Ref Range Status    SARS Coronavirus 2 by RT PCR NEGATIVE NEGATIVE Final    Comment: (NOTE) SARS-CoV-2 target nucleic acids are NOT DETECTED.  The SARS-CoV-2 RNA is generally detectable in upper respiratory specimens during the acute phase of infection. The lowest concentration of  SARS-CoV-2 viral copies this assay can detect is 138 copies/mL. A negative result does not preclude SARS-Cov-2 infection and should not be used as the sole basis for treatment or other patient management decisions. A negative result may occur with  improper specimen collection/handling, submission of specimen other than nasopharyngeal swab, presence of viral mutation(s) within the areas targeted by this assay, and inadequate number of viral copies(<138 copies/mL). A negative result must be combined with clinical observations, patient history, and epidemiological information. The expected result is Negative.  Fact Sheet for Patients:  EntrepreneurPulse.com.au  Fact Sheet for Healthcare Providers:  IncredibleEmployment.be  This test is no t yet approved or cleared by the [place] FDA and  has been authorized for detection and/or diagnosis of SARS-CoV-2 by FDA under an Emergency Use Authorization (EUA). This EUA will remain  in effect (meaning this test can be used) for the duration of the COVID-19 declaration under Section 564(b)(1) of the Act, 21 U.S.C.section 360bbb-3(b)(1), unless the authorization is terminated  or revoked sooner.       Influenza A by PCR NEGATIVE NEGATIVE Final   Influenza B by PCR NEGATIVE NEGATIVE Final    Comment: (NOTE) The Xpert Xpress SARS-CoV-2/FLU/RSV plus assay is intended as an aid in the diagnosis of influenza from Nasopharyngeal swab specimens and should not be used as a sole basis for treatment. Nasal washings and aspirates are unacceptable for Xpert Xpress SARS-CoV-2/FLU/RSV testing.  Fact Sheet for  Patients: EntrepreneurPulse.com.au  Fact Sheet for Healthcare Providers: IncredibleEmployment.be  This test is not yet approved or cleared by the [place] FDA and has been authorized for detection and/or diagnosis of SARS-CoV-2 by FDA under an Emergency Use Authorization (EUA). This EUA will remain in effect (meaning this test can be used) for the duration of the COVID-19 declaration under Section 564(b)(1) of the Act, 21 U.S.C. section 360bbb-3(b)(1), unless the authorization is terminated or revoked.  Performed at [facility], [address] [place], [place] [address]           Radiology Studies: CARDIAC CATHETERIZATION  Result Date: 06/14/2021   Dist RCA lesion is 50% stenosed.   Mid RCA lesion is 50% stenosed. 1.  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  Patent left main, LAD, and left circumflex, with minimal irregularity in each vessel but no significant stenosis 3.  Mildly elevated right heart pressures with mean pulmonary artery pressure 38, RA pressure of 9, pulmonary capillary wedge pressure 19 mmHg. Recommend: Patient appears to have severe nonischemic cardiomyopathy, continue heart failure treatments per primary team        Scheduled Meds:  apixaban  5 mg Oral BID   aspirin EC  81 mg Oral Daily   atorvastatin  40 mg Oral Daily   feeding supplement  237 mL Oral BID BM   furosemide  80 mg Intravenous BID   multivitamin with minerals  1 tablet Oral Daily   potassium chloride  40 mEq Oral BID   sodium chloride flush  3 mL Intravenous Q12H   sodium chloride flush  3 mL Intravenous Q12H   Continuous Infusions:  sodium chloride       LOS: 4 days        Hosie Poisson, MD [facility]   To contact the attending provider between 7A-7P or the covering provider during after hours 7P-7A, please log into the web site [email or web address] and access  using universal [place] password for that web site. If  you do not have the password, please call the hospital operator.  06/16/2021, 11:05 AM

## 2021-06-16 NOTE — Progress Notes (Signed)
Mobility Specialist Progress Note    06/16/21 1358  Mobility  Activity Ambulated in hall  Level of Assistance Standby assist, set-up cues, supervision of patient - no hands on  Assistive Device None  Distance Ambulated (ft) 480 ft  Mobility Ambulated independently in hallway  Mobility Response Tolerated fair  Mobility performed by Mobility specialist  $Mobility charge 1 Mobility   Pre-Mobility: 87 HR, 93% SpO2 During Mobility: 87% SpO2 Post-Mobility:  96% SpO2  Pt received standing at EOB and agreeable on RA. SpO2 dropped to 87% on RA so increased to 1LO2 and SpO2 maintained in low 90s. Encouraged pursed lip breathing. Pt stated he sometimes wears oxygen at home. Upon return to room, connected pt back to room O2 at 4LO2 and pt satting at 96%. Left in bed with call bell in reach. RN notified.  [facility] Mobility Specialist  M.S. 2C and 6E: [phone] M.S. 4E: [phone] [phone]

## 2021-06-16 NOTE — Progress Notes (Signed)
Pharmacist Heart Failure Core Measure Documentation  Assessment: Evan Serrano has an EF documented as 20-25% on 06/13/21 by echo.  Rationale: Heart failure patients with left ventricular systolic dysfunction (LVSD) and an EF < 40% should be prescribed an angiotensin converting enzyme inhibitor (ACEI) or angiotensin receptor blocker (ARB) at discharge unless a contraindication is documented in the medical record.  This patient is not currently on an ACEI or ARB for HF.  This note is being placed in the record in order to provide documentation that a contraindication to the use of these agents is present for this encounter.  ACE Inhibitor or Angiotensin Receptor Blocker is contraindicated (specify all that apply)  []   ACEI allergy AND ARB allergy []   Angioedema []   Moderate or severe aortic stenosis []   Hyperkalemia [x]   Hypotension []   Renal artery stenosis []   Worsening renal function, preexisting renal disease or dysfunction   06/16/2021 2:02 PM

## 2021-06-17 ENCOUNTER — Other Ambulatory Visit (HOSPITAL_COMMUNITY): Payer: Self-pay

## 2021-06-17 ENCOUNTER — Encounter (HOSPITAL_COMMUNITY): Payer: Self-pay | Admitting: Cardiovascular Disease

## 2021-06-17 DIAGNOSIS — I5023 Acute on chronic systolic (congestive) heart failure: Secondary | ICD-10-CM | POA: Diagnosis not present

## 2021-06-17 LAB — CBC
HCT: 52.1 % — ABNORMAL HIGH (ref 39.0–52.0)
Hemoglobin: 16.9 g/dL (ref 13.0–17.0)
MCH: 26.8 pg (ref 26.0–34.0)
MCHC: 32.4 g/dL (ref 30.0–36.0)
MCV: 82.7 fL (ref 80.0–100.0)
Platelets: 155 10*3/uL (ref 150–400)
RBC: 6.3 MIL/uL — ABNORMAL HIGH (ref 4.22–5.81)
RDW: 17.1 % — ABNORMAL HIGH (ref 11.5–15.5)
WBC: 8.2 10*3/uL (ref 4.0–10.5)
nRBC: 0 % (ref 0.0–0.2)

## 2021-06-17 LAB — BASIC METABOLIC PANEL
Anion gap: 8 (ref 5–15)
BUN: 17 mg/dL (ref 8–23)
CO2: 31 mmol/L (ref 22–32)
Calcium: 9.4 mg/dL (ref 8.9–10.3)
Chloride: 99 mmol/L (ref 98–111)
Creatinine, Ser: 1.29 mg/dL — ABNORMAL HIGH (ref 0.61–1.24)
GFR, Estimated: 59 mL/min — ABNORMAL LOW (ref >60)
Glucose, Bld: 82 mg/dL (ref 70–99)
Potassium: 4.2 mmol/L (ref 3.5–5.1)
Sodium: 138 mmol/L (ref 135–145)

## 2021-06-17 LAB — MAGNESIUM: Magnesium: 2.3 mg/dL (ref 1.7–2.4)

## 2021-06-17 MED ORDER — TORSEMIDE 20 MG PO TABS: 80.0000 mg | ORAL_TABLET | Freq: Two times a day (BID) | ORAL | Status: DC

## 2021-06-17 MED ORDER — ATORVASTATIN CALCIUM 40 MG PO TABS: 40.0000 mg | ORAL_TABLET | Freq: Every day | ORAL | 1 refills | Status: AC

## 2021-06-17 MED ORDER — APIXABAN 5 MG PO TABS: 5.0000 mg | ORAL_TABLET | Freq: Two times a day (BID) | ORAL | 3 refills | Status: DC

## 2021-06-17 MED ORDER — TORSEMIDE 20 MG PO TABS: 80.0000 mg | ORAL_TABLET | Freq: Two times a day (BID) | ORAL | 2 refills | Status: DC

## 2021-06-17 MED ORDER — POTASSIUM CHLORIDE CRYS ER 20 MEQ PO TBCR: 20.0000 meq | EXTENDED_RELEASE_TABLET | Freq: Every day | ORAL | 1 refills | Status: DC

## 2021-06-17 MED ORDER — METOPROLOL TARTRATE 12.5 MG HALF TABLET: 12.5000 mg | ORAL_TABLET | Freq: Two times a day (BID) | ORAL | Status: DC

## 2021-06-17 MED ORDER — TORSEMIDE 20 MG PO TABS
80.0000 mg | ORAL_TABLET | Freq: Two times a day (BID) | ORAL | 0 refills | Status: DC
  Filled 2021-06-17: qty 16, 2d supply, fill #0

## 2021-06-17 MED ORDER — APIXABAN 5 MG PO TABS
5.0000 mg | ORAL_TABLET | Freq: Two times a day (BID) | ORAL | 3 refills | Status: AC
  Filled 2021-06-17: qty 60, 30d supply, fill #0

## 2021-06-17 MED ORDER — METOPROLOL SUCCINATE ER 25 MG PO TB24: 25.0000 mg | ORAL_TABLET | Freq: Every day | ORAL | 11 refills | Status: DC

## 2021-06-17 NOTE — Progress Notes (Signed)
°   06/17/21 0452  Assess: MEWS Score  Temp 97.8 F (36.6 C)  BP 100/82  ECG Heart Rate 79  Resp (!) 24  SpO2 93 %  Assess: MEWS Score  MEWS Temp 0  MEWS Systolic 1  MEWS Pulse 0  MEWS RR 1  MEWS LOC 0  MEWS Score 2  MEWS Score Color Yellow  Assess: if the MEWS score is Yellow or Red  Were vital signs taken at a resting state? No  Focused Assessment No change from prior assessment  Early Detection of Sepsis Score *See Row Information* Low  MEWS guidelines implemented *See Row Information* No, other (Comment) (pt was getting up for VS)  Document  Patient Outcome Stabilized after interventions  Progress note created (see row info) Yes  Assess: SIRS CRITERIA  SIRS Temperature  0  SIRS Pulse 0  SIRS Respirations  1  SIRS WBC 0  SIRS Score Sum  1

## 2021-06-17 NOTE — Progress Notes (Signed)
Physical Therapy Treatment Patient Details Name: Evan Serrano MRN: [id number] DOB: June 20, 1950 Today's Date: 06/17/2021   History of Present Illness Pt is a 71 y.o. male who presented 06/12/21 to ED with SOB, transferred to [facility] 1/13. Pt with 2 syncopal episodes secondary to coughing. Pt admitted with CHF exacerbation. 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 Pt able to ambulate 200' with rollator and negotiate 12 steps to enter apt safely with R hand rail. SpO2 at 94% on RA. Acute PT to cont to follow to progress towards independence.   Recommendations for follow up therapy are one component of a multi-disciplinary discharge planning process, led by the attending physician.  Recommendations may be updated based on patient status, additional functional criteria and insurance authorization.  Follow Up Recommendations  Home health PT     Assistance Recommended at Discharge PRN  Patient can return home with the following Help with stairs or ramp for entrance;A little help with bathing/dressing/bathroom   Equipment Recommendations  Other (comment) (shower chair)    Recommendations for Other Services       Precautions / Restrictions Precautions Precautions: Fall Precaution Comments: 1.5L O2 baseline Restrictions Weight Bearing Restrictions: No     Mobility  Bed Mobility Overal bed mobility: Modified Independent Bed Mobility: Supine to Sit;Sit to Supine           General bed mobility comments: mod I, safe    Transfers Overall transfer level: Needs assistance   Transfers: Sit to/from Stand Sit to Stand: Supervision           General transfer comment: no difficulty, good line management    Ambulation/Gait Ambulation/Gait assistance: Min guard Gait Distance (Feet): 100 Feet (x2) Assistive device: Rollator (4 wheels) Gait Pattern/deviations: Step-through pattern Gait velocity: wfl Gait velocity interpretation: >2.62  ft/sec, indicative of community ambulatory   General Gait Details: pt steady and fluid, SpO2 >94% on RA, seated rest break to complete stairs but didn't rest after stairs and continued walking back to room   Stairs Stairs: Yes Stairs assistance: Min guard Stair Management: One rail Right;Step to pattern;Forwards Number of Stairs: 12 General stair comments: slow, guarded, cautious, step to pattern   Wheelchair Mobility    Modified Rankin (Stroke Patients Only)       Balance Overall balance assessment: Mild deficits observed, not formally tested   Sitting balance-Leahy Scale: Good       Standing balance-Leahy Scale: Poor                              Cognition Arousal/Alertness: Awake/alert Behavior During Therapy: WFL for tasks assessed/performed Overall Cognitive Status: Within Functional Limits for tasks assessed                                 General Comments: talkative but motivated and cooperative        Exercises      General Comments General comments (skin integrity, edema, etc.): VSS      Pertinent Vitals/Pain Pain Assessment: No/denies pain    Home Living                          Prior Function            PT Goals (current goals can now be found in  the care plan section) Acute Rehab PT Goals PT Goal Formulation: With patient Time For Goal Achievement: 06/28/21 Potential to Achieve Goals: Good Progress towards PT goals: Progressing toward goals    Frequency    Min 3X/week      PT Plan Current plan remains appropriate    Co-evaluation              AM-PAC PT "6 Clicks" Mobility   Outcome Measure  Help needed turning from your back to your side while in a flat bed without using bedrails?: A Little Help needed moving from lying on your back to sitting on the side of a flat bed without using bedrails?: A Little Help needed moving to and from a bed to a chair (including a wheelchair)?: A  Little Help needed standing up from a chair using your arms (e.g., wheelchair or bedside chair)?: A Little Help needed to walk in hospital room?: A Little Help needed climbing 3-5 steps with a railing? : A Little 6 Click Score: 18    End of Session Equipment Utilized During Treatment: Gait belt Activity Tolerance: Patient tolerated treatment well Patient left: in bed;with call bell/phone within reach Nurse Communication: Mobility status PT Visit Diagnosis: Unsteadiness on feet (R26.81);Other abnormalities of gait and mobility (R26.89);Muscle weakness (generalized) (M62.81);Difficulty in walking, not elsewhere classified (R26.2)     Time: [id number] PT Time Calculation (min) (ACUTE ONLY): 14 min  Charges:  $Gait Training: 8-22 mins                     Kittie Plater, PT, DPT Acute Rehabilitation Services Pager #: [phone] Office #: [phone]    Berline Lopes 06/17/2021, 2:25 PM

## 2021-06-17 NOTE — Progress Notes (Signed)
Heart Failure Navigator Progress Note  Assessed for Heart & Vascular TOC clinic readiness.  Patient does not meet criteria [facility] [place]. Pt understands he needs to be seen by cardiology before his standing appointment 06/27/21. Plans to attend the walk-in clinic on Thursday to get appt with cardiology if possible.   Pricilla Holm, MSN, RN Heart Failure Nurse Navigator [phone]

## 2021-06-17 NOTE — Progress Notes (Signed)
Heart Failure Stewardship Pharmacist Progress Note   PCP: [facility] PCP-Cardiologist: Pixie Casino, MD    HPI:  71 yo M with PMH of HFrEF, HTN, ICD, PAF, OSA, and CKD III. He presented to the ED on 1/11 with shortness of breath, intermittent LE edema, and dizziness. A CXR was done and showed cardiomegaly with vascular congestion but without overt pulmonary edema. An ECHO was done on 1/12 and LVE was 20-25% with G3DD. R/LHC done on 1/13 and found to have NICM with mildly elevated filling pressures.  Discharge HF Medications: Diuretic: torsemide 80 mg BID Beta Blocker: metoprolol XL 25 mg daily  Prior to admission HF Medications: Beta blocker: carvedilol 6.25 mg BID ACE/ARB/ARNI: losartan 25 mg daily Aldosterone Antagonist: spironolactone 12.5 mg daily  Pertinent Lab Values: Serum creatinine 1.29, BUN 17, Potassium 4.2, Sodium 138, BNP 1445, Magnesium 2.3  Vital Signs: Weight: 212 lbs (admission weight: 218 lbs) Blood pressure: 100/80s  Heart rate: 80s  I/O: -2.2L yesterday; net -7.4L  Medication Assistance / Insurance Benefits Check: Does the patient have prescription insurance?  Yes Type of insurance plan: [facility]   Assessment: 1. Acute on chronic systolic CHF (EF 0000000), due to NICM. NYHA class II symptoms. - continue torsemide 80 mg BID - no losartan or spironolactone with hypotension  - continue metoprolol XL 25 mg daily - consider adding Jardiance 10 mg daily   Plan: 1) Medication changes recommended at this time: - Add Jardiance 10 mg daily - discussed with cards and will wait until outpatient follow up  2) Patient assistance: - Jardiance preferred over [place] by [place], PharmD, BCPS Heart Failure Cytogeneticist Phone [phone]

## 2021-06-17 NOTE — Progress Notes (Signed)
Progress Note  Patient Name: Evan Serrano Date of Encounter: 06/17/2021  Primary Cardiologist: Pixie Casino, MD   Subjective   No events overnight. Persistent tachypnea this AM Has not been using his CPAP Patient notes that he feels better but not at baseline. Is worried that he doesn't have the right O2 canisters at home  Inpatient Medications    Scheduled Meds:  apixaban  5 mg Oral BID   aspirin EC  81 mg Oral Daily   atorvastatin  40 mg Oral Daily   feeding supplement  237 mL Oral BID BM   furosemide  80 mg Intravenous BID   multivitamin with minerals  1 tablet Oral Daily   potassium chloride  40 mEq Oral BID   sodium chloride flush  3 mL Intravenous Q12H   sodium chloride flush  3 mL Intravenous Q12H   Continuous Infusions:  sodium chloride     PRN Meds: sodium chloride, acetaminophen **OR** acetaminophen, guaiFENesin-dextromethorphan, ondansetron (ZOFRAN) IV, sodium chloride flush   Vital Signs    Vitals:   06/16/21 1923 06/17/21 0026 06/17/21 0452 06/17/21 0453  BP: 124/65 107/88 100/82   Pulse:      Resp: (!) 21 14 (!) 24 20  Temp: 98.7 F (37.1 C) 97.8 F (36.6 C) 97.8 F (36.6 C)   TempSrc: Oral Oral Oral   SpO2: 93% 93% 93% 97%  Weight:   96.3 kg   Height:        Intake/Output Summary (Last 24 hours) at 06/17/2021 0752 Last data filed at 06/17/2021 0455 Gross per 24 hour  Intake 960 ml  Output 2500 ml  Net -1540 ml   Filed Weights   06/15/21 0414 06/16/21 0348 06/17/21 0452  Weight: 96.3 kg 96.2 kg 96.3 kg    Telemetry    Two runs of NSVT, longest 10 beats - Personally Reviewed  ECG    SR rate 75 PR prolongation inferior infarct PVC - Personally Reviewed  Physical Exam   Gen: no distress   Neck: JVD to lower third of neck Cardiac: No Rubs or Gallops, no Murmur, regular rhythm,  +2 radial pulses Respiratory: Clear to auscultation bilaterally, normal effort, normal  respiratory rate on exam GI: Soft, nontender, non-distended   MS: No  edema;  moves all extremities Integument: Skin feels warm Neuro:  At time of evaluation, alert and oriented to person/place/time/situation  Psych: Normal affect, patient feels better   Labs    Chemistry Recent Labs  Lab 06/12/21 1030 06/12/21 1839 06/13/21 0336 06/14/21 0209 06/14/21 0447 06/15/21 0353 06/16/21 0314 06/17/21 0248  NA 139  --  139 138   < > 139 139 138  K 3.6  --  3.8 3.4*   < > 3.3* 4.0 4.2  CL 101  --  103 104   < > 98 98 99  CO2 29  --  27 29   < > 29 29 31   GLUCOSE 92  --  89 87   < > 78 85 82  BUN 17  --  18 16   < > 15 17 17   CREATININE 1.46*  --  1.48* 1.48*   < > 1.33* 1.36* 1.29*  CALCIUM 9.4  --  9.1 8.4*   < > 9.0 9.5 9.4  PROT 6.9 7.3 6.2*  --   --   --   --   --   ALBUMIN 3.8 3.7 3.3* 3.0*  --   --   --   --  AST 23 27 23   --   --   --   --   --   ALT 21 25 12   --   --   --   --   --   ALKPHOS 83 85 76  --   --   --   --   --   BILITOT 2.8* 3.0* 2.6*  --   --   --   --   --   GFRNONAA 51*  --  50* 50*   < > 57* 56* 59*  ANIONGAP 9  --  9 5   < > 12 12 8    < > = values in this interval not displayed.     Hematology Recent Labs  Lab 06/15/21 0353 06/16/21 0314 06/17/21 0248  WBC 6.6 8.2 8.2  RBC 5.76 6.01* 6.30*  HGB 15.4 16.5 16.9  HCT 47.8 49.7 52.1*  MCV 83.0 82.7 82.7  MCH 26.7 27.5 26.8  MCHC 32.2 33.2 32.4  RDW 16.4* 16.3* 17.1*  PLT 142* 150 155    Cardiac EnzymesNo results for input(s): TROPONINI in the last 168 hours. No results for input(s): TROPIPOC in the last 168 hours.   BNP Recent Labs  Lab 06/12/21 1030  BNP 1,445.1*     DDimer  Recent Labs  Lab 06/12/21 1030  DDIMER <0.27     Radiology    No results found.  Patient Profile     71 y.o. 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 - ARNI and much of his GDMT is limited by hypotension - SGLT2i potentially this admission - metoprolol 12.5 mg PO BID today, if tolerated we will transition to 25 mg succinate - potentially MRA 06/18/21 (prior on 12.5 mg PO daily) - home diuretic is torsemide - Plan to DC on eliquis without aspirin - had has issues with home CPAP; deferred CPAP here when I offered - through exam weaned to room air, O2 96&  We have reviewed his overall morbidity and mortality risks given the above medical conditions  For questions or updates, please contact [facility] Please consult [email or web address] for contact info under Cardiology/STEMI.      Signed, Werner Lean, MD  06/17/2021, 7:52 AM

## 2021-06-17 NOTE — Plan of Care (Signed)
°  Problem: Education: Goal: Ability to demonstrate management of disease process will improve Outcome: Adequate for Discharge Goal: Ability to verbalize understanding of medication therapies will improve Outcome: Adequate for Discharge Goal: Individualized Educational Video(s) Outcome: Adequate for Discharge   Problem: Activity: Goal: Capacity to carry out activities will improve Outcome: Adequate for Discharge   Problem: Cardiac: Goal: Ability to achieve and maintain adequate cardiopulmonary perfusion will improve Outcome: Adequate for Discharge   Problem: Education: Goal: Knowledge of General Education information will improve Description: Including pain rating scale, medication(s)/side effects and non-pharmacologic comfort measures Outcome: Adequate for Discharge   Problem: Health Behavior/Discharge Planning: Goal: Ability to manage health-related needs will improve Outcome: Adequate for Discharge   Problem: Clinical Measurements: Goal: Ability to maintain clinical measurements within normal limits will improve Outcome: Adequate for Discharge Goal: Diagnostic test results will improve Outcome: Adequate for Discharge Goal: Respiratory complications will improve Outcome: Adequate for Discharge Goal: Cardiovascular complication will be avoided Outcome: Adequate for Discharge   Problem: Elimination: Goal: Will not experience complications related to bowel motility Outcome: Adequate for Discharge Goal: Will not experience complications related to urinary retention Outcome: Adequate for Discharge   Problem: Safety: Goal: Ability to remain free from injury will improve Outcome: Adequate for Discharge   Problem: Skin Integrity: Goal: Risk for impaired skin integrity will decrease Outcome: Adequate for Discharge   Problem: Acute Rehab OT Goals (only OT should resolve) Goal: Pt. Will Perform Grooming Outcome: Adequate for Discharge Goal: Pt. Will Perform Upper Body  Bathing Outcome: Adequate for Discharge Goal: Pt. Will Perform Lower Body Bathing Outcome: Adequate for Discharge Goal: Pt. Will Perform Upper Body Dressing Outcome: Adequate for Discharge Goal: Pt. Will Perform Lower Body Dressing Outcome: Adequate for Discharge Goal: Pt. Will Perform Toileting-Clothing Manipulation Outcome: Adequate for Discharge Goal: Pt. Will Perform Tub/Shower Transfer Outcome: Adequate for Discharge   Problem: Acute Rehab PT Goals(only PT should resolve) Goal: Patient Will Transfer Sit To/From Stand Outcome: Adequate for Discharge Goal: Pt Will Transfer Bed To Chair/Chair To Bed Outcome: Adequate for Discharge Goal: Pt Will Ambulate Outcome: Adequate for Discharge Goal: Pt Will Go Up/Down Stairs Outcome: Adequate for Discharge

## 2021-06-17 NOTE — TOC Transition Note (Addendum)
Transition of Care [facility]) - CM/SW Discharge Note   Patient Details  Name: Evan Serrano MRN: [id number] Date of Birth: 1951-06-01  Transition of [facility] CM/SW Contact:  Zenon Mayo, RN Phone Number: 06/17/2021, 10:37 AM   Clinical Narrative:    Patient is for dc today, he is set up with [facility] for [place].  [facility] will call to get authorization for [facility].  NCM spoke with patient at bedside he has home oxygen with [employer] and would like to stay with [employer], he was on 1.5 liters ,now will be on 4 liters. NCM made referral to [facility] with [employer] and he will also bring an e tank for patient to go home with. Medications were sent to the [facility] on [facility] , so they are not open , but patient states he would prefer to pick them up from the [place] free tomorrow. This NCM notified MD of this information.  Since he has been started on eliquis, NCM asked MD to send the eliquis script to [place] to be filled with the first 30 day free coupon.  Thedore Mins states it is showing that he has oxygen with someone else.  Patient states he has oxygen with [employer]. Thedore Mins states he spoke with wife and she states it is with the [place].  NCM asked for the number on the oxygn supplies, she states it is [employer] [phone].  NCM called and the recording says [employer].  NCM will contact [employer] again. Huey Romans states they will have to requalify him for the oxygen.  They will call this NCM back.   1/16- NCM spoke with manager at [facility], she states they only billed him for 5 months not 36 months, so he should be able to go with another DME company.  This information was given to [facility] with [employer] he states since they only billed for 5 months then they can start up referral for patient for home oxygen. Per Thedore Mins patient did not want to pay a co pay amt.  NCM spoke with patient , will make referral to [facility] with [employer], he states he will see if he can assist patient with the oxygen.    Final  next level of care: [place] Barriers to Discharge: No Barriers Identified   Patient Goals and CMS Choice Patient states their goals for this hospitalization and ongoing recovery are:: return home CMS Medicare.gov Compare Post Acute Care list provided to:: Patient Choice offered to / list presented to : Patient  Discharge Placement                       Discharge Plan and Services                DME Arranged: Oxygen DME Agency [employer] Date DME Agency Contacted: 06/17/21 Time DME Agency Contacted 30 Representative spoke with at [place]: PT [place]: [place] Date [place]: 06/14/21 Time [place]: [place] Representative spoke with at [place]: [place] Determinants of Health ([place]) Interventions     Readmission Risk Interventions No flowsheet data found.

## 2021-06-17 NOTE — Progress Notes (Signed)
Occupational Therapy Treatment Patient Details Name: Evan Serrano MRN: [id number] DOB: [birth year] Today's Date: 06/17/2021   History of present illness Pt is a 71 y.o. male who presented 06/12/21 to ED with SOB, transferred to [facility] 1/13. Pt with 2 syncopal episodes secondary to coughing. Pt admitted with CHF exacerbation. 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 Pt eager to d/c home this afternoon. Session focused on walking to bathroom 1 person HHA for shower and toilet transfers min guard A - Sup using grab bars for safety, toileting tasks, grooming standing at sink, simulated LB bathing while standing min guard A   Recommendations for follow up therapy are one component of a multi-disciplinary discharge planning process, led by the attending physician.  Recommendations may be updated based on patient status, additional functional criteria and insurance authorization.    Follow Up Recommendations  Home health OT    Assistance Recommended at Discharge Intermittent Supervision/Assistance  Patient can return home with the following  A little help with walking and/or transfers;A little help with bathing/dressing/bathroom;Assist for transportation   Equipment Recommendations  Tub/shower seat    Recommendations for Other Services      Precautions / Restrictions Precautions Precautions: Fall Precaution Comments: 1.5L O2 baseline Restrictions Weight Bearing Restrictions: No       Mobility Bed Mobility Overal bed mobility: Needs Assistance Bed Mobility: Supine to Sit;Sit to Supine     Supine to sit: Supervision;HOB elevated Sit to supine: Supervision   General bed mobility comments: sup for safety, no physical assist required    Transfers Overall transfer level: Needs assistance Equipment used: 1 person hand held assist Transfers: Sit to/from Stand;Bed to chair/wheelchair/BSC Sit to Stand: Min  guard;Supervision   Step pivot transfers: Min guard;Supervision       General transfer comment: min guard A for safety     Balance Overall balance assessment: Mild deficits observed, not formally tested Sitting-balance support: No upper extremity supported;Feet supported Sitting balance-Leahy Scale: Good     Standing balance support: Reliant on assistive device for balance Standing balance-Leahy Scale: Poor Standing balance comment: Reliant on RW                           ADL either performed or assessed with clinical judgement   ADL Overall ADL's : Needs assistance/impaired     Grooming: Wash/dry hands;Wash/dry face;Standing;Supervision/safety       Lower Body Bathing: Min Psychologist, educational;Sit to/from stand       Lower Body Dressing: Min guard;Supervision/safety   Toilet Transfer: Min guard;Supervision/safety;Ambulation Toilet Transfer Details (indicate cue type and reason): HHA, no AD to walk to bathroom Toileting- Clothing Manipulation and Hygiene: Supervision/safety;Sit to/from stand   Tub/ Shower Transfer: Supervision/safety;Ambulation;Grab bars   Functional mobility during ADLs: Min guard;Supervision/safety      Extremity/Trunk Assessment Upper Extremity Assessment Upper Extremity Assessment: Generalized weakness   Lower Extremity Assessment Lower Extremity Assessment: Defer to PT evaluation   Cervical / Trunk Assessment Cervical / Trunk Assessment: Normal    Vision Baseline Vision/History: 1 Wears glasses Ability to See in Adequate Light: 0 Adequate Patient Visual Report: No change from baseline     Perception     Praxis      Cognition Arousal/Alertness: Awake/alert Behavior During Therapy: WFL for tasks assessed/performed Overall Cognitive Status: Within Functional Limits for tasks assessed  General Comments: very friendly,  talkative          Exercises     Shoulder  Instructions       General Comments      Pertinent Vitals/ Pain       Pain Assessment: No/denies pain  Home Living                                          Prior Functioning/Environment              Frequency  Min 2X/week        Progress Toward Goals  OT Goals(current goals can now be found in the care plan section)  Progress towards OT goals: Progressing toward goals     Plan Discharge plan remains appropriate    Co-evaluation                 AM-PAC OT "6 Clicks" Daily Activity     Outcome Measure   Help from another person eating meals?: None Help from another person taking care of personal grooming?: A Little Help from another person toileting, which includes using toliet, bedpan, or urinal?: A Little Help from another person bathing (including washing, rinsing, drying)?: A Little Help from another person to put on and taking off regular upper body clothing?: None Help from another person to put on and taking off regular lower body clothing?: A Little 6 Click Score: 20    End of Session    OT Visit Diagnosis: Other abnormalities of gait and mobility (R26.89);Muscle weakness (generalized) (M62.81)   Activity Tolerance Patient tolerated treatment well   Patient Left in bed;with call bell/phone within reach (sitting EOB)   Nurse Communication          TimeCL:[id number] OT Time Calculation (min): 20 min  Charges: OT General Charges $OT Visit: 1 Visit OT Treatments $Self Care/Home Management : 8-22 mins    Emmit Alexanders [facility] 06/17/2021, 2:04 PM

## 2021-06-23 NOTE — Discharge Summary (Signed)
Physician Discharge Summary  Evan Serrano [id number] DOB: [birth year] DOA: 06/12/2021  PCP: [facility]  Admit date: 06/12/2021 Discharge date: 06/17/2021  Admitted From: Home.  Disposition:  Home.   Recommendations for Outpatient Follow-up:  Follow up with PCP in 1-2 weeks Please obtain BMP/CBC in one week Please follow up with cardiology as recommended.   Discharge Condition:stable.  CODE STATUS:Full code.  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  BNP elevated to 1400.  CXR consistent with CHF.  Received IV Lasix 20 mg in ED and became hypotensive with systolic in [id number] and 0000000.  Cardiology consulted and recommended transfer to [facility] for further care and evaluation. Pt underwent cardiac catheterization showing non obstructive CAD. Pt is being diuresed . Pt reports he uses about 2 lit of  oxygen at home, will check ambulating oxygen levels on discharge.  Discharge Diagnoses:  Principal Problem:   CHF exacerbation ([place]) Active Problems:   Orthostatic hypotension   Renal insufficiency   Implantable cardioverter-defibrillator (ICD) in situ   Hyperlipidemia   Obstructive sleep apnea syndrome  Acute on Chronic respiratory failure on 2 lit/min.  - secondary to  acute on chronic systolic and  diastolic heart failure.   appears to be volume overloaded. - CXR showing Cardiomegaly with vascular congestion but without overt pulmonary edema. - Echocardiogram showed Left ventricular ejection fraction is 20 to 25%,  has severely decreased function. The left ventricle demonstrates global hypokinesis. The left ventricular internal cavity  size was severely dilated. Left ventricular  diastolic parameters are consistent with Grade III diastolic dysfunction (restrictive).  -IV lasix 80 mg BID. transition to oral torsemide on discharge.  - continue with strict intake and output, daily weights        Hypokalemia  Replaced.  Repeat level around 4    PAF:  - rate controlled. On Eliquis for anti coagulation.      Nonischemic cardiomyopathy S/p ICD for VF Continue to monitor.   Obstructive sleep apnea on CPAP   Hyperlipidemia:  Resume lipitor.      Elevated troponins  Demand ischemia from CHF.      Stage 3b CKD:  Creatinine improving.  Creatinine around 1.3    Essential hypertension Well controlled BP.     Discharge Instructions  Discharge Instructions     Diet - low sodium heart healthy   Complete by: As directed    Increase activity slowly   Complete by: As directed       Allergies as of 06/17/2021   No Known Allergies      Medication List     STOP taking these medications    aspirin 81 MG EC tablet   carvedilol 12.5 MG tablet Commonly known as: COREG   losartan 25 MG tablet Commonly known as: Cozaar   simvastatin 10 MG tablet Commonly known as: ZOCOR   spironolactone 25 MG tablet Commonly known as: ALDACTONE       TAKE these medications    atorvastatin 40 MG tablet Commonly known as: LIPITOR Take 1 tablet (40 mg total) by mouth daily.   Eliquis 5 MG Tabs tablet Generic drug: apixaban Take 1 tablet (5 mg total) by mouth  2 (two) times daily.   feeding supplement Liqd Take 237 mLs by mouth 2 (two) times daily between meals.   metoprolol succinate 25 MG 24 hr tablet Commonly known as: Toprol XL Take 1 tablet (25 mg total) by mouth daily.   multivitamin with minerals Tabs tablet Take 1 tablet by mouth daily.   potassium chloride SA 20 MEQ tablet Commonly known as: KLOR-CON M Take 1 tablet (20 mEq total) by mouth daily.   torsemide 20 MG tablet Commonly known as:  DEMADEX Take 4 tablets (80 mg total) by mouth 2 (two) times daily. What changed:  how much to take when to take this additional instructions   torsemide 20 MG tablet Commonly known as: DEMADEX Take 4 tablets (80 mg total) by mouth 2 (two) times daily for 2 days. What changed: You were already taking a medication with the same name, and this prescription was added. Make sure you understand how and when to take each.   Vitamin D (Cholecalciferol) 10 MCG (400 UNIT) Caps Take 800 Units by mouth daily.        Follow-up Information     [facility] Follow up.   Why: HHPT, HHOT Contact information: [address] [place] [address] [phone]         [facility] Follow up.   Why: Please follow up in a week. Contact information: [address] [facility] [place] [address] [phone]         Chrystie Nose, MD .   Specialty: Cardiology Contact information: [address] [place] [address] [place] [address] [phone]         [facility] Follow up.   Contact information: [address] [facility] [place] [address] [phone]         Chrystie Nose, MD .   Specialty: Cardiology Contact information: [address] [place] [address] [place] [address] [phone]         [facility] Follow up.   Why: home oxygen [phone]               No Known Allergies  Consultations: Cardiology.    Procedures/Studies: CARDIAC CATHETERIZATION  Result Date: 06/14/2021   Dist RCA lesion is 50% stenosed.   Mid RCA lesion is 50% stenosed. 1.  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  Patent left main, LAD, and left circumflex, with minimal irregularity in each vessel but no significant stenosis 3.  Mildly elevated right heart pressures with mean pulmonary artery pressure 38, RA pressure of 9, pulmonary capillary wedge  pressure 19 mmHg. Recommend: Patient appears to have severe nonischemic cardiomyopathy, continue heart failure treatments per primary team   DG Chest Port 1 View  Result Date: 06/12/2021 CLINICAL DATA:  Shortness of breath EXAM: PORTABLE CHEST 1 VIEW COMPARISON:  05/24/2021 FINDINGS: Unchanged position of left chest cardiac device and lead. Unchanged cardiomegaly. Unchanged mediastinal contours. Unchanged vascular congestion without overt pulmonary edema. No focal pulmonary opacity. No definite pleural effusion. No acute osseous abnormality. IMPRESSION: Cardiomegaly with vascular congestion but without overt pulmonary edema. Electronically Signed   By: Wiliam Ke M.D.   On: 06/12/2021 12:03   ECHOCARDIOGRAM COMPLETE  Result Date: 06/13/2021    ECHOCARDIOGRAM REPORT   Patient Name:   NEILL ROWAN Date of Exam: 06/13/2021 Medical Rec #:  [id number]        Height:       68.0 in Accession #:  [id number]       Weight:       221.8 lb Date of Birth:  Feb 04, 1951       BSA:          2.136 m Patient Age:    84 years         BP:           104/87 mmHg Patient Gender: M                HR:           72 bpm. Exam Location:  Inpatient Procedure: 2D Echo, Cardiac Doppler and Color Doppler Indications:    CHF  History:        Patient has prior history of Echocardiogram examinations, most                 recent 01/13/2021. Arrythmias:Atrial Fibrillation,                 Signs/Symptoms:Syncope and Dyspnea; Risk Factors:Hypertension.                 VTACH/ pacer device.  Sonographer:    Beryle Beams Referring Phys: [id number] Aneta  1. Left ventricular ejection fraction, by estimation, is 20 to 25%. The left ventricle has severely decreased function. The left ventricle demonstrates global hypokinesis. The left ventricular internal cavity size was severely dilated. Left ventricular diastolic parameters are consistent with Grade III diastolic dysfunction (restrictive). Elevated left ventricular  end-diastolic pressure.  2. Right ventricular systolic function is low normal. The right ventricular size is normal. There is moderately elevated pulmonary artery systolic pressure. The estimated right ventricular systolic pressure is 123456 mmHg.  3. Left atrial size was severely dilated.  4. Right atrial size was severely dilated.  5. The mitral valve is abnormal. Mild to moderate mitral valve regurgitation.  6. The tricuspid valve is abnormal. Tricuspid valve regurgitation is moderate.  7. The aortic valve is tricuspid. Aortic valve regurgitation is mild. Aortic valve sclerosis/calcification is present, without any evidence of aortic stenosis.  8. Aortic dilatation noted. There is borderline dilatation of the ascending aorta, measuring 39 mm.  9. The inferior vena cava is dilated in size with <50% respiratory variability, suggesting right atrial pressure of 15 mmHg. Comparison(s): Changes from prior study are noted. 01/13/2021: LVEF 20-25%, global HK, grade 3 DD, elevated LVEDP. FINDINGS  Left Ventricle: Left ventricular ejection fraction, by estimation, is 20 to 25%. The left ventricle has severely decreased function. The left ventricle demonstrates global hypokinesis. The left ventricular internal cavity size was severely dilated. There is no left ventricular hypertrophy. Left ventricular diastolic parameters are consistent with Grade III diastolic dysfunction (restrictive). Elevated left ventricular end-diastolic pressure. Right Ventricle: The right ventricular size is normal. No increase in right ventricular wall thickness. Right ventricular systolic function is low normal. There is moderately elevated pulmonary artery systolic pressure. The tricuspid regurgitant velocity  is 3.16 m/s, and with an assumed right atrial pressure of 15 mmHg, the estimated right ventricular systolic pressure is 123456 mmHg. Left Atrium: Left atrial size was severely dilated. Right Atrium: Right atrial size was severely dilated.  Pericardium: There is no evidence of pericardial effusion. Mitral Valve: The mitral valve is abnormal. There is mild thickening of the anterior and posterior mitral valve leaflet(s). Mild to moderate mitral valve regurgitation. MV peak gradient, 35.3 mmHg. The mean mitral valve gradient is 28.0 mmHg. Tricuspid Valve: The tricuspid valve is abnormal. Tricuspid valve regurgitation is moderate. Aortic  Valve: The aortic valve is tricuspid. Aortic valve regurgitation is mild. Aortic regurgitation PHT measures 825 msec. Aortic valve sclerosis/calcification is present, without any evidence of aortic stenosis. Aortic valve mean gradient measures 2.0  mmHg. Aortic valve peak gradient measures 3.7 mmHg. Aortic valve area, by VTI measures 2.04 cm. Pulmonic Valve: The pulmonic valve was grossly normal. Pulmonic valve regurgitation is trivial. Aorta: Aortic dilatation noted. There is borderline dilatation of the ascending aorta, measuring 39 mm. Venous: The inferior vena cava is dilated in size with less than 50% respiratory variability, suggesting right atrial pressure of 15 mmHg. IAS/Shunts: No atrial level shunt detected by color flow Doppler. Additional Comments: A device lead is visualized.  LEFT VENTRICLE PLAX 2D LVIDd:         7.00 cm      Diastology LVIDs:         6.50 cm      LV e' medial:    6.31 cm/s LV PW:         1.00 cm      LV E/e' medial:  14.1 LV IVS:        0.90 cm      LV e' lateral:   7.51 cm/s LVOT diam:     1.80 cm      LV E/e' lateral: 11.9 LV SV:         36 LV SV Index:   17 LVOT Area:     2.54 cm  LV Volumes (MOD) LV vol d, MOD A2C: 203.0 ml LV vol d, MOD A4C: 204.0 ml LV vol s, MOD A2C: 151.0 ml LV vol s, MOD A4C: 136.0 ml LV SV MOD A2C:     52.0 ml LV SV MOD A4C:     204.0 ml LV SV MOD BP:      61.4 ml RIGHT VENTRICLE             IVC RV S prime:     10.30 cm/s  IVC diam: 2.30 cm TAPSE (M-mode): 2.0 cm LEFT ATRIUM              Index        RIGHT ATRIUM           Index LA diam:        5.30 cm  2.48  cm/m   RA Area:     37.40 cm LA Vol (A2C):   131.0 ml 61.34 ml/m  RA Volume:   162.00 ml 75.86 ml/m LA Vol (A4C):   128.0 ml 59.94 ml/m LA Biplane Vol: 130.0 ml 60.87 ml/m  AORTIC VALVE                    PULMONIC VALVE AV Area (Vmax):    1.85 cm     PV Vmax:       0.40 m/s AV Area (Vmean):   1.84 cm     PV Peak grad:  0.6 mmHg AV Area (VTI):     2.04 cm AV Vmax:           96.10 cm/s AV Vmean:          66.500 cm/s AV VTI:            0.175 m AV Peak Grad:      3.7 mmHg AV Mean Grad:      2.0 mmHg LVOT Vmax:         69.80 cm/s LVOT Vmean:        48.000 cm/s LVOT  VTI:          0.140 m LVOT/AV VTI ratio: 0.80 AI PHT:            825 msec  AORTA Ao Root diam: 2.80 cm Ao Asc diam:  3.90 cm MITRAL VALVE               TRICUSPID VALVE MV Area VTI:  0.29 cm     TV Peak grad:   48.7 mmHg MV Peak grad: 35.3 mmHg    TV Mean grad:   32.0 mmHg MV Mean grad: 28.0 mmHg    TV Vmax:        3.49 m/s MV Vmax:      2.97 m/s     TV Vmean:       266.0 cm/s MV Vmean:     260.0 cm/s   TV VTI:         1.30 msec MV E velocity: 89.12 cm/s  TR Peak grad:   39.9 mmHg                            TR Vmax:        316.00 cm/s                             SHUNTS                            Systemic VTI:  0.14 m                            Systemic Diam: 1.80 cm Lyman Bishop MD Electronically signed by Lyman Bishop MD Signature Date/Time: 06/13/2021/5:40:25 PM    Final       Subjective:  No new complaints.  Discharge Exam: Vitals:   06/17/21 0453 06/17/21 1131  BP:  93/74  Pulse:  92  Resp: 20 15  Temp:  98 F (36.7 C)  SpO2: 97% 95%   Vitals:   06/17/21 0026 06/17/21 0452 06/17/21 0453 06/17/21 1131  BP: 107/88 100/82  93/74  Pulse:    92  Resp: 14 (!) 24 20 15   Temp: 97.8 F (36.6 C) 97.8 F (36.6 C)  98 F (36.7 C)  TempSrc: Oral Oral  Oral  SpO2: 93% 93% 97% 95%  Weight:  96.3 kg    Height:        General: Pt is alert, awake, not in acute distress Cardiovascular: RRR, S1/S2 +, no rubs, no gallops Respiratory:  CTA bilaterally, no wheezing, no rhonchi Abdominal: Soft, NT, ND, bowel sounds + Extremities: no edema, no cyanosis    The results of significant diagnostics from this hospitalization (including imaging, microbiology, ancillary and laboratory) are listed below for reference.     Microbiology: No results found for this or any previous visit (from the past 240 hour(s)).   Labs: BNP (last 3 results) Recent Labs    01/12/21 0958 05/24/21 1532 06/12/21 1030  BNP 1,707.8* 1,497.6* [id number]*   Basic Metabolic Panel: Recent Labs  Lab 06/17/21 0248  NA 138  K 4.2  CL 99  CO2 31  GLUCOSE 82  BUN 17  CREATININE 1.29*  CALCIUM 9.4  MG 2.3   Liver Function Tests: No results for input(s): AST, ALT, ALKPHOS, BILITOT, PROT, ALBUMIN in the last 168 hours. No results for input(s):  LIPASE, AMYLASE in the last 168 hours. No results for input(s): AMMONIA in the last 168 hours. CBC: Recent Labs  Lab 06/17/21 0248  WBC 8.2  HGB 16.9  HCT 52.1*  MCV 82.7  PLT 155   Cardiac Enzymes: No results for input(s): CKTOTAL, CKMB, CKMBINDEX, TROPONINI in the last 168 hours. BNP: Invalid input(s): POCBNP CBG: No results for input(s): GLUCAP in the last 168 hours. D-Dimer No results for input(s): DDIMER in the last 72 hours. Hgb A1c No results for input(s): HGBA1C in the last 72 hours. Lipid Profile No results for input(s): CHOL, HDL, LDLCALC, TRIG, CHOLHDL, LDLDIRECT in the last 72 hours. Thyroid function studies No results for input(s): TSH, T4TOTAL, T3FREE, THYROIDAB in the last 72 hours.  Invalid input(s): FREET3 Anemia work up No results for input(s): VITAMINB12, FOLATE, FERRITIN, TIBC, IRON, RETICCTPCT in the last 72 hours. Urinalysis    Component Value Date/Time   COLORURINE YELLOW 06/12/2021 1342   APPEARANCEUR CLEAR 06/12/2021 1342   LABSPEC 1.023 06/12/2021 1342   PHURINE 5.5 06/12/2021 1342   GLUCOSEU NEGATIVE 06/12/2021 1342   HGBUR NEGATIVE 06/12/2021 1342    BILIRUBINUR NEGATIVE 06/12/2021 1342   KETONESUR NEGATIVE 06/12/2021 1342   PROTEINUR 30 (A) 06/12/2021 1342   NITRITE NEGATIVE 06/12/2021 1342   LEUKOCYTESUR LARGE (A) 06/12/2021 1342   Sepsis Labs Invalid input(s): PROCALCITONIN,  WBC,  LACTICIDVEN Microbiology No results found for this or any previous visit (from the past 240 hour(s)).   Time coordinating discharge: 42 minutes.   SIGNED:   Hosie Poisson, MD  [facility]

## 2021-06-24 ENCOUNTER — Telehealth (HOSPITAL_COMMUNITY): Payer: Self-pay | Admitting: Pharmacist

## 2021-06-24 ENCOUNTER — Other Ambulatory Visit (HOSPITAL_COMMUNITY): Payer: Self-pay

## 2021-06-24 NOTE — Telephone Encounter (Signed)
Transitions of Care Pharmacy   Call attempted for a pharmacy transitions of care follow-up. HIPAA appropriate voicemail was left with call back information provided.   Call attempt #1. Will follow-up in 1-3 days.    

## 2021-06-26 ENCOUNTER — Telehealth (HOSPITAL_COMMUNITY): Payer: Self-pay | Admitting: Pharmacist

## 2021-06-26 ENCOUNTER — Other Ambulatory Visit (HOSPITAL_COMMUNITY): Payer: Self-pay

## 2021-06-26 NOTE — Telephone Encounter (Signed)
Pharmacy Transitions of Care Follow-up Telephone Call  Date of discharge: 06/17/21  Discharge Diagnosis: CHF exacerbation,  and A.fib  How have you been since you were released from the hospital? Overall well but concerned that [place] had to adjust torsemide dose.   Medication changes made at discharge:  - START:  atorvastatin (LIPITOR)  Eliquis (apixaban)  metoprolol succinate (Toprol XL)  potassium chloride SA (KLOR-CON M)   - STOPPED:  aspirin 81 MG EC tablet  carvedilol 12.5 MG tablet (COREG)  losartan 25 MG tablet (Cozaar)  simvastatin 10 MG tablet (ZOCOR)  spironolactone 25 MG tablet (ALDACTONE)   - CHANGED: toresemide  Medication changes verified by the patient? Yes    Medication Accessibility:  Home Pharmacy: [facility]   Was the patient provided with refills on discharged medications? No, will obtain from [place]   Have all prescriptions been transferred from [facility] to home pharmacy? N/a   Is the patient able to afford medications? [facility] Notable copays: Eliqius Eligible patient assistance: [facility]    Medication Review:  APIXABAN (ELIQUIS)  Apixaban 5 mg BID initiated on .   - Discussed importance of taking medication around the same time everyday  - Reviewed potential DDIs with patient  - Advised patient of medications to avoid (NSAIDs, ASA)  - Educated that Tylenol (acetaminophen) will be the preferred analgesic to prevent risk of bleeding  - Emphasized importance of monitoring for signs and symptoms of bleeding (abnormal bruising, prolonged bleeding, nose bleeds, bleeding from gums, discolored urine, black tarry stools)  - Advised patient to alert all providers of anticoagulation therapy prior to starting a new medication or having a procedure    Follow-up Appointments:  PCP Hospital f/u appt confirmed?  None, seen by [facility] cardio clinic  Specialist Hospital f/u appt confirmed? Seen by [facility] cardiology clinic this past Monday, will follow-up again in May   If their condition worsens,  is the pt aware to call PCP or go to the Emergency Dept.? yes  Final Patient Assessment: Patient was appreciative of the call.  We have discussed Eliquis.  Patient reports compliance and denies bleeding/bruising.  We have also discussed torsemide, metoprolol (replaced carvedilol), potassium, as pt had questions about these.  [facility] is [facility] and will obtain refills from them.

## 2021-08-01 ENCOUNTER — Other Ambulatory Visit: Payer: Self-pay

## 2021-08-01 ENCOUNTER — Emergency Department (HOSPITAL_COMMUNITY): Payer: No Typology Code available for payment source

## 2021-08-01 ENCOUNTER — Emergency Department (HOSPITAL_COMMUNITY)
Admission: EM | Admit: 2021-08-01 | Discharge: 2021-08-02 | Disposition: A | Discharge status: Home / Self Care | Payer: No Typology Code available for payment source | Attending: Emergency Medicine | Admitting: Emergency Medicine

## 2021-08-01 DIAGNOSIS — R7989 Other specified abnormal findings of blood chemistry: Secondary | ICD-10-CM | POA: Diagnosis not present

## 2021-08-01 DIAGNOSIS — R0602 Shortness of breath: Secondary | ICD-10-CM | POA: Diagnosis present

## 2021-08-01 DIAGNOSIS — Z20822 Contact with and (suspected) exposure to covid-19: Secondary | ICD-10-CM | POA: Diagnosis not present

## 2021-08-01 DIAGNOSIS — J441 Chronic obstructive pulmonary disease with (acute) exacerbation: Secondary | ICD-10-CM | POA: Insufficient documentation

## 2021-08-01 DIAGNOSIS — I480 Paroxysmal atrial fibrillation: Secondary | ICD-10-CM | POA: Insufficient documentation

## 2021-08-01 DIAGNOSIS — R778 Other specified abnormalities of plasma proteins: Secondary | ICD-10-CM | POA: Diagnosis not present

## 2021-08-01 DIAGNOSIS — Z7901 Long term (current) use of anticoagulants: Secondary | ICD-10-CM | POA: Insufficient documentation

## 2021-08-01 DIAGNOSIS — Z79899 Other long term (current) drug therapy: Secondary | ICD-10-CM | POA: Diagnosis not present

## 2021-08-01 DIAGNOSIS — N183 Chronic kidney disease, stage 3 unspecified: Secondary | ICD-10-CM | POA: Diagnosis not present

## 2021-08-01 DIAGNOSIS — E876 Hypokalemia: Secondary | ICD-10-CM | POA: Insufficient documentation

## 2021-08-01 LAB — BASIC METABOLIC PANEL
Anion gap: 11 (ref 5–15)
BUN: 14 mg/dL (ref 8–23)
CO2: 30 mmol/L (ref 22–32)
Calcium: 9.1 mg/dL (ref 8.9–10.3)
Chloride: 95 mmol/L — ABNORMAL LOW (ref 98–111)
Creatinine, Ser: 1.73 mg/dL — ABNORMAL HIGH (ref 0.61–1.24)
GFR, Estimated: 42 mL/min — ABNORMAL LOW (ref >60)
Glucose, Bld: 99 mg/dL (ref 70–99)
Potassium: 3 mmol/L — ABNORMAL LOW (ref 3.5–5.1)
Sodium: 136 mmol/L (ref 135–145)

## 2021-08-01 LAB — CBC
HCT: 48.7 % (ref 39.0–52.0)
Hemoglobin: 15.8 g/dL (ref 13.0–17.0)
MCH: 27.1 pg (ref 26.0–34.0)
MCHC: 32.4 g/dL (ref 30.0–36.0)
MCV: 83.4 fL (ref 80.0–100.0)
Platelets: 148 10*3/uL — ABNORMAL LOW (ref 150–400)
RBC: 5.84 MIL/uL — ABNORMAL HIGH (ref 4.22–5.81)
RDW: 17.1 % — ABNORMAL HIGH (ref 11.5–15.5)
WBC: 7.2 10*3/uL (ref 4.0–10.5)
nRBC: 0 % (ref 0.0–0.2)

## 2021-08-01 LAB — BRAIN NATRIURETIC PEPTIDE: B Natriuretic Peptide: 1378.3 pg/mL — ABNORMAL HIGH (ref 0.0–100.0)

## 2021-08-01 LAB — TROPONIN I (HIGH SENSITIVITY)
Troponin I (High Sensitivity): 39 ng/L — ABNORMAL HIGH (ref <18)
Troponin I (High Sensitivity): 39 ng/L — ABNORMAL HIGH (ref <18)

## 2021-08-01 MED ORDER — METHYLPREDNISOLONE SODIUM SUCC 125 MG IJ SOLR
125.0000 mg | Freq: Once | INTRAMUSCULAR | Status: AC
  Administered 2021-08-01: 125 mg via INTRAVENOUS
  Filled 2021-08-01: qty 2

## 2021-08-01 MED ORDER — MAGNESIUM SULFATE 2 GM/50ML IV SOLN
2.0000 g | INTRAVENOUS | Status: AC
  Administered 2021-08-01: 2 g via INTRAVENOUS
  Filled 2021-08-01: qty 50

## 2021-08-01 MED ORDER — IPRATROPIUM-ALBUTEROL 0.5-2.5 (3) MG/3ML IN SOLN
3.0000 mL | Freq: Once | RESPIRATORY_TRACT | Status: AC
  Administered 2021-08-01: 3 mL via RESPIRATORY_TRACT
  Filled 2021-08-01: qty 3

## 2021-08-01 NOTE — ED Notes (Signed)
Oxygen tank replaced °

## 2021-08-01 NOTE — ED Provider Triage Note (Signed)
Emergency Medicine Provider Triage Evaluation Note ? ?Evan Serrano , a 71 y.o. male  was evaluated in triage.  Pt complains of SOB and weakness over last 2 days following an upper respiratory infection he came down with on Sunday.  Also dizzy and lightheaded.  On 2L O2 at home.  Hx CHF and COPD. ? ?Review of Systems  ?Positive: SOB, weakness ?Negative: Fever, chest pain ? ?Physical Exam  ?BP 110/89 (BP Location: Left Arm)   Pulse 91   Temp 98.3 ?F (36.8 ?C) (Oral)   Resp 20   SpO2 95%  ?Gen:   Awake, no distress   ?Resp:  Increased effort, global wheezes bilaterally ?MSK:   Moves extremities without difficulty  ?Other:  Hoarseness, coughing ? ?Medical Decision Making  ?Medically screening exam initiated at 6:40 PM.  Appropriate orders placed.  Doss Cybulski was informed that the remainder of the evaluation will be completed by another provider, this initial triage assessment does not replace that evaluation, and the importance of remaining in the ED until their evaluation is complete. ? ?Labs, imaging, EKG ordered ?  ?Cecil Cobbs, PA-C ?08/01/21 1850 ? ?

## 2021-08-01 NOTE — ED Triage Notes (Signed)
Pt from home for eval of sob this afternoon. Reports hx of CHF and COPD and is supposed to be wearing 2L O2 all the time but he accidentally brought the empty tank. Denies chest pain.  ?

## 2021-08-01 NOTE — ED Provider Notes (Signed)
?[facility] ?Provider Note ? ? ?CSN: [id number] ?Arrival date & time: 08/01/21  1803 ? ?  ? ?History ? ?Chief Complaint  ?Patient presents with  ? Shortness of Breath  ? ? 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 Pt reports he traveled to [place] last weekend via train and when he got there developed a sore throat and cough with clear sputum production. Denies any fever or chills. Pt reports since coming home his baseline SOB has worsened and his cough has not improved. No sore throat currently. He denies any LE edema or new orthopnea. Pt has been compliant with his meds and took an extra dose of his Torsemide yesterday as he gained 1lb. No weight gain today. Denies CP, NVD, abd pain, fever, chills, urinary sxs, or any other medical complaints.  ? ?Shortness of Breath ? ?  ? ?Home Medications ?Prior to Admission medications   ?Medication Sig Start Date End Date Taking? Authorizing Provider  ?apixaban (ELIQUIS) 5 MG TABS tablet Take 1 tablet (5 mg total) by mouth 2 (two) times daily. 06/17/21   Hosie Poisson, MD  ?atorvastatin (LIPITOR) 40 MG tablet Take 1 tablet (40 mg total) by mouth daily. 06/18/21   Hosie Poisson, MD  ?feeding supplement (ENSURE ENLIVE / ENSURE PLUS) LIQD Take 237 mLs by mouth 2 (two) times daily between meals. 01/14/21   Elgergawy, Silver Huguenin, MD  ?metoprolol succinate (TOPROL XL) 25 MG 24 hr tablet Take 1 tablet (25 mg total) by mouth daily. 06/17/21 06/17/22  Hosie Poisson, MD  ?Multiple Vitamin (MULTIVITAMIN WITH MINERALS) TABS tablet Take 1 tablet by mouth daily. 01/14/21   Elgergawy, Silver Huguenin, MD  ?potassium chloride SA (KLOR-CON M) 20 MEQ tablet Take 1 tablet (20 mEq total) by mouth daily. 06/18/21   Hosie Poisson, MD  ?torsemide (DEMADEX) 20 MG tablet  Take 4 tablets (80 mg total) by mouth 2 (two) times daily. 06/17/21   Hosie Poisson, MD  ?torsemide (DEMADEX) 20 MG tablet Take 4 tablets (80 mg total) by mouth 2 (two) times daily for 2 days. 06/17/21 06/19/21  Hosie Poisson, MD  ?Vitamin D, Cholecalciferol, 10 MCG (400 UNIT) CAPS Take 800 Units by mouth daily.    [provider]  ?   ? ?Allergies    ?Patient has no known allergies.   ? ?Review of Systems   ?Review of Systems  ?Respiratory:  Positive for shortness of breath.   ? ?Physical Exam ?Updated Vital Signs ?BP 104/77   Pulse (!) 144   Temp 98.3 ?F (36.8 ?C) (Oral)   Resp 19   SpO2 97%  ?Physical Exam ? ?ED Results / Procedures / Treatments   ?Labs ?(all labs ordered are listed, but only abnormal results are displayed) ?Labs Reviewed  ?BASIC METABOLIC PANEL - Abnormal; Notable for the following components:  ?    Result Value  ? Potassium 3.0 (*)   ? Chloride 95 (*)   ? Creatinine, Ser 1.73 (*)   ? GFR, Estimated 42 (*)   ? All other components within normal limits  ?CBC - Abnormal; Notable for the following components:  ? RBC 5.84 (*)   ? RDW 17.1 (*)   ? Platelets 148 (*)   ?  All other components within normal limits  ?BRAIN NATRIURETIC PEPTIDE - Abnormal; Notable for the following components:  ? B Natriuretic Peptide 1,378.3 (*)   ? All other components within normal limits  ?TROPONIN I (HIGH SENSITIVITY) - Abnormal; Notable for the following components:  ? Troponin I (High Sensitivity) 39 (*)   ? All other components within normal limits  ?TROPONIN I (HIGH SENSITIVITY)  ? ? ?EKG ?EKG Interpretation ? ?Date/Time:  Thursday August 01 2021 18:06:32 EST ?Ventricular Rate:  84 ?PR Interval:    ?QRS Duration: 128 ?QT Interval:  408 ?QTC Calculation: 482 ?R Axis:   -72 ?Text Interpretation: Undetermined rhythm Left axis deviation Left bundle branch block Abnormal ECG increased amplitude Otherwise no significant change Confirmed by Deno Etienne [phone]) on 08/01/2021 8:46:29 PM ? ?Radiology ?DG Chest 2  View ? ?Result Date: 08/01/2021 ?CLINICAL DATA:  Chest pain EXAM: CHEST - 2 VIEW COMPARISON:  Previous studies including the examination of 06/12/2021 FINDINGS: Transverse diameter of heart is increased. There is interval decrease in pulmonary vascular congestion. There is interval decrease in interstitial markings in the parahilar regions and lower lung fields. Costophrenic angles are clear. There is no pneumothorax. Pacemaker/defibrillator battery is seen in the left infraclavicular region. IMPRESSION: Cardiomegaly. There is interval decrease in pulmonary vascular congestion. There is decrease in interstitial markings in the parahilar regions and lower lung fields suggesting possible resolution of mild interstitial pulmonary edema. There are no new focal infiltrates. Electronically Signed   By: Elmer Picker M.D.   On: 08/01/2021 19:07   ? ?Procedures ?Procedures  ? ? ?Medications Ordered in ED ?Medications - No data to display ? ?ED Course/ Medical Decision Making/ A&P ?Clinical Course as of 08/04/21 1445  ?Thu Aug 01, 2021  ?2345 I reevaluated the patient at bedside.  He is lying flat in lateral recumbent position.  I reevaluated his lungs and they are sounding much better.  He has minimal wheezing with expiratory ration and states that he feels greatly improved after single treatment with DuoNeb. [AH]  ?Sun Aug 04, 2021  ?1444 EKG shows rate controlled Afib at a rate of 73 ? [AH]  ?  ?Clinical Course User Index ?[AH] Margarita Mail, PA-C  ? ?                        ?Medical Decision Making ?70 y/o male here with c/o sob. ?The emergent differential diagnosis for shortness of breath includes, but is not limited to, Pulmonary edema, bronchoconstriction, Pneumonia, Pulmonary embolism, Pneumotherax/ Hemothorax, Dysrythmia, ACS.  ?I suspect the patient is having the patient is having a COPD exacerbation ?Patient treated in the ED with a single duoneb, + solumedrol, and magnesium- ?Patient breathing easily-  minimal exp wheezes , able to lie flat without dyspnea. ?Greatly improved after meds. ?Will d/c with albuterol inhaler and medrol doseapack ? ? ? ? ?Amount and/or Complexity of Data Reviewed ?Labs: ordered. Decision-making details documented in ED Course. ?   Details: trops elevated but stable - I do not thing that sob isdue to ACS- ?metabolic panel show mild hypokalemia and creatinine just above baseline ?BNP markedly elevated however patient exhibits NO si/sx of volume overload ?Radiology: ordered and independent interpretation performed. ?   Details: I visualized the patients CXR and found no evidence of pnuemonia or edema ? ?Risk ?Prescription drug management. ? ? ? ? ? ?  ? ? ? ? ?Final Clinical Impression(s) / ED Diagnoses ?Final diagnoses:  ?None  ? ? ?  Rx / DC Orders ?ED Discharge Orders   ? ? None  ? ?  ? ? ?  ?Margarita Mail, PA-C ?08/04/21 1446 ? ?  ?Deno Etienne, DO ?08/04/21 1733 ? ?

## 2021-08-02 LAB — RESP PANEL BY RT-PCR (FLU A&B, COVID) ARPGX2
Influenza A by PCR: NEGATIVE
Influenza B by PCR: NEGATIVE
SARS Coronavirus 2 by RT PCR: NEGATIVE

## 2021-08-02 MED ORDER — AEROCHAMBER PLUS FLO-VU LARGE MISC: 1.0000 | Freq: Once | Status: AC

## 2021-08-02 MED ORDER — AEROCHAMBER PLUS FLO-VU LARGE MISC
Status: AC
  Administered 2021-08-02: 1
  Filled 2021-08-02: qty 1

## 2021-08-02 MED ORDER — METHYLPREDNISOLONE 4 MG PO TBPK: ORAL_TABLET | ORAL | 0 refills | Status: DC

## 2021-08-02 MED ORDER — ALBUTEROL SULFATE HFA 108 (90 BASE) MCG/ACT IN AERS
2.0000 | INHALATION_SPRAY | Freq: Once | RESPIRATORY_TRACT | Status: AC
  Administered 2021-08-02: 2 via RESPIRATORY_TRACT
  Filled 2021-08-02: qty 6.7

## 2021-08-02 NOTE — ED Notes (Signed)
RN reviewed discharge instructions with pt. Pt verbalized understanding and had no further questions. VSS upon discharge.  

## 2021-08-02 NOTE — Discharge Instructions (Addendum)
Get help right away if: You have shortness of breath while you are resting. You have shortness of breath that prevents you from: Being able to talk. Performing your usual physical activities. You have chest pain lasting longer than 5 minutes. Your skin color is more blue (cyanotic) than usual. You measure low oxygen saturations for longer than 5 minutes with a pulse oximeter. You have a fever. You feel too tired to breathe normally. 

## 2021-08-02 NOTE — ED Notes (Signed)
Pts RR is now even and unlabored. Pt reports decreased work of breathing after duoneb ?

## 2021-09-19 ENCOUNTER — Other Ambulatory Visit (HOSPITAL_COMMUNITY): Payer: Self-pay

## 2021-09-19 ENCOUNTER — Ambulatory Visit (HOSPITAL_COMMUNITY)
Admission: RE | Admit: 2021-09-19 | Discharge: 2021-09-19 | Disposition: A | Discharge status: Home / Self Care | Payer: No Typology Code available for payment source | Source: Ambulatory Visit | Attending: Internal Medicine | Admitting: Internal Medicine

## 2021-09-19 ENCOUNTER — Other Ambulatory Visit (HOSPITAL_COMMUNITY): Payer: Self-pay | Admitting: Internal Medicine

## 2021-09-19 ENCOUNTER — Encounter (HOSPITAL_COMMUNITY): Payer: Self-pay | Admitting: Internal Medicine

## 2021-09-19 VITALS — BP 110/70 | HR 88 | Wt 208.2 lb

## 2021-09-19 DIAGNOSIS — I2510 Atherosclerotic heart disease of native coronary artery without angina pectoris: Secondary | ICD-10-CM

## 2021-09-19 DIAGNOSIS — I509 Heart failure, unspecified: Secondary | ICD-10-CM

## 2021-09-19 DIAGNOSIS — I493 Ventricular premature depolarization: Secondary | ICD-10-CM

## 2021-09-19 DIAGNOSIS — N1831 Chronic kidney disease, stage 3a: Secondary | ICD-10-CM

## 2021-09-19 DIAGNOSIS — I5042 Chronic combined systolic (congestive) and diastolic (congestive) heart failure: Secondary | ICD-10-CM | POA: Diagnosis not present

## 2021-09-19 DIAGNOSIS — I5022 Chronic systolic (congestive) heart failure: Secondary | ICD-10-CM | POA: Diagnosis present

## 2021-09-19 DIAGNOSIS — Z7901 Long term (current) use of anticoagulants: Secondary | ICD-10-CM | POA: Diagnosis not present

## 2021-09-19 DIAGNOSIS — G4733 Obstructive sleep apnea (adult) (pediatric): Secondary | ICD-10-CM

## 2021-09-19 DIAGNOSIS — I480 Paroxysmal atrial fibrillation: Secondary | ICD-10-CM | POA: Diagnosis not present

## 2021-09-19 LAB — BASIC METABOLIC PANEL
Anion gap: 10 (ref 5–15)
BUN: 16 mg/dL (ref 8–23)
CO2: 26 mmol/L (ref 22–32)
Calcium: 9.6 mg/dL (ref 8.9–10.3)
Chloride: 102 mmol/L (ref 98–111)
Creatinine, Ser: 1.89 mg/dL — ABNORMAL HIGH (ref 0.61–1.24)
GFR, Estimated: 37 mL/min — ABNORMAL LOW (ref >60)
Glucose, Bld: 83 mg/dL (ref 70–99)
Potassium: 4.1 mmol/L (ref 3.5–5.1)
Sodium: 138 mmol/L (ref 135–145)

## 2021-09-19 LAB — MAGNESIUM: Magnesium: 2 mg/dL (ref 1.7–2.4)

## 2021-09-19 LAB — BRAIN NATRIURETIC PEPTIDE: B Natriuretic Peptide: 1446.8 pg/mL — ABNORMAL HIGH (ref 0.0–100.0)

## 2021-09-19 MED ORDER — TORSEMIDE 20 MG PO TABS
ORAL_TABLET | ORAL | 4 refills | Status: DC
Start: 2021-09-19 — End: 2021-09-24

## 2021-09-19 MED ORDER — EMPAGLIFLOZIN 25 MG PO TABS: 25.0000 mg | ORAL_TABLET | Freq: Every day | ORAL | 5 refills | Status: DC

## 2021-09-19 MED ORDER — TORSEMIDE 20 MG PO TABS: 40.0000 mg | ORAL_TABLET | Freq: Every day | ORAL | 4 refills | Status: DC

## 2021-09-19 NOTE — Progress Notes (Signed)
ReDS Vest / Clip - 09/19/21 1300   ? ?  ? ReDS Vest / Clip  ? Station Marker C   ? Ruler Value 30.5   ? ReDS Value Range High volume overload   ? ReDS Actual Value 45   ? ?  ?  ? ?  ? ? ?

## 2021-09-19 NOTE — Progress Notes (Addendum)
? ?ADVANCED HF CLINIC NOTE ? ?Referring Physician: Pixie Casino, MD ?Primary Care: [facility]  ?Primary Cardiologist: ? ?HPI: ? ? ?Evan Serrano is a 71 y.o. male with HFrEF (EF 20-25%, Echo 06/2021) 2/2 NICM with ICD placed 99991111, HTN complicated by orthostatic hypotension, PAF (CHADSVASC 3) on chronic AC with eliquis, OSA on CPAP, chronic hypoxemic respiratory failure on 1L of O2 at home (since 06/2021) and CKD stage IIIa.  ? ?HFrEF diagnosed 2011 in [place] after patient had URI. He underwent left heart catheterization at that time and was told his coronary arteries were "clean". ? ?On 12/26/222 had ICD (MDT) shock for an episode of ventricular fibrillation after failed ATP.  ? ?In 1/23 hospitalized with a/c HF.Echo EF 20-25%, severely dilated LV, G3 DD, severely dilated LA/RA, moderate MR and TR, mild AR. Not significantly changed from 12/2020 echo. LHC mild non obstructive CAD. ~50% mid and distal RCA. RHC: mean PAP 38, RA 9, PCWP 19 ? ?Since discharge, he continues to have NYHA III-IIIBDOE and states that he can only walk from his kitchen to his bedroom before becoming short of breath. He does state that he intermittently feels like he cannot take in enough air and uses his rescue inhaler which helps his symptoms.  ? ?He sleeps with 2 pillows at baseline and denies any worsening orthopnea or PND. He also denies lower extremity swelling. Feels his torsemide 60mg  daily dose is managing his fluid status appropriately. He also denies any orthostasis but does state when he has a coughing spell he can become lightheaded.  ? ?He does note continued issues with food intake, stating he has a food aversion. He does not feel food gets stuck in his throat or chest. He mostly drinks Ensure for nutrition though this is limited by the diarrhea it causes him.  ? ?He is a never smoker and does not currently consume alcohol or use any recreational drugs. 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 ? ?He has an older sister diagnosed with HF at 70, otherwise no one else in his family.  ? ? ?Review of Systems: [x]  = yes, [ ]  = no  ? ?General: Weight gain [ ] ; Weight loss [ x]; Anorexia [ x]; Fatigue [x ]; Fever [ ] ; Chills [ ] ; Weakness [ ]   ?Cardiac: Chest pain/pressure [ ] ; Resting SOB [ x]; Exertional SOB [ x]; Orthopnea [ ] ; Pedal Edema [ ] ; Palpitations [ ] ; Syncope [ ] ; Presyncope [ x]; Paroxysmal nocturnal dyspnea[ ]   ?Pulmonary: Cough [ x]; Wheezing[ x]; Hemoptysis[ ] ; Sputum [ ] ; Snoring [ ]   ?GI: Vomiting[ ] ; Dysphagia[ ] ; Melena[ ] ; Hematochezia [ ] ; Heartburn[ ] ; Abdominal pain [ ] ; Constipation [ ] ; Diarrhea [ ] ; BRBPR [ ]   ?GU: Hematuria[ ] ; Dysuria [ ] ; Nocturia[ ]   ?Vascular: Pain in legs with walking [ ] ; Pain in feet with lying flat [ ] ; Non-healing sores [ ] ; Stroke [ ] ; TIA [ ] ; Slurred speech [ ] ;  ?Neuro: Headaches[ ] ; Vertigo[ ] ; Seizures[ ] ; Paresthesias[ ] ;Blurred vision [ ] ; Diplopia [ ] ; Vision changes [ ]   ?Ortho/Skin: Arthritis [ x]; Joint pain [ x]; Muscle pain [ ] ; Joint swelling [ ] ; Back Pain [ ] ; Rash [ ]   ?Psych: Depression[ ] ; Anxiety[ ]   ?Heme: Bleeding problems [ ] ; Clotting disorders [ ] ; Anemia [ ]   ?Endocrine: Diabetes [ ] ; Thyroid dysfunction[ ]  ? ? ?Past Medical History:  ?  Diagnosis Date  ? Cardiac pacemaker   ? COVID   ? Hypertension   ? Presence of cardiac defibrillator   ? ? ?Current Outpatient Medications  ?Medication Sig Dispense Refill  ? apixaban (ELIQUIS) 5 MG TABS tablet Take 1 tablet (5 mg total) by mouth 2 (two) times daily. 60 tablet 3  ? atorvastatin (LIPITOR) 40 MG tablet Take 1 tablet (40 mg total) by mouth daily. 60 tablet 1  ? Chlorphen-PE-Acetaminophen (ROBITUSSIN COLD/CONGESTION PO) Take 1 Dose by mouth 3 (three) times daily as needed (cough).    ? isosorbide-hydrALAZINE (BIDIL) 20-37.5 MG tablet Take 1 tablet by mouth 2 (two) times daily.    ? metoprolol succinate (TOPROL XL) 25 MG 24 hr tablet Take 1  tablet (25 mg total) by mouth daily. 30 tablet 11  ? Multiple Vitamin (MULTIVITAMIN WITH MINERALS) TABS tablet Take 1 tablet by mouth daily.    ? Nutritional Supplements (ENSURE ACTIVE PO) Take 1 Can by mouth as needed.    ? OXYGEN Inhale 2 L into the lungs continuous.    ? potassium chloride SA (KLOR-CON M) 20 MEQ tablet Take 1 tablet (20 mEq total) by mouth daily. 30 tablet 1  ? Vitamin D, Cholecalciferol, 10 MCG (400 UNIT) CAPS Take 400 Units by mouth in the morning and at bedtime.    ? empagliflozin (JARDIANCE) 25 MG TABS tablet Take 1 tablet (25 mg total) by mouth daily before breakfast. Take 1/2 tablet by mouth daily 45 tablet 3  ? torsemide (DEMADEX) 20 MG tablet Take 2 tablets (40 mg total) by mouth every morning AND 1 tablet (20 mg total) every evening. 180 tablet 4  ? ?No current facility-administered medications for this encounter.  ? ? ?Allergies  ?Allergen Reactions  ? Iodinated Contrast Media Shortness Of Breath and Rash  ?  Other reaction(s): Cough  ? ? ?  ?Social History  ? ?Socioeconomic History  ? Marital status: Married  ?  Spouse name: Not on file  ? Number of children: Not on file  ? Years of education: Not on file  ? Highest education level: Not on file  ?Occupational History  ? Not on file  ?Tobacco Use  ? Smoking status: Never  ? Smokeless tobacco: Never  ?Vaping Use  ? Vaping Use: Never used  ?Substance and Sexual Activity  ? Alcohol use: Never  ? Drug use: Never  ? Sexual activity: Not Currently  ?Other Topics Concern  ? Not on file  ?Social History Narrative  ? Not on file  ? ?Social Determinants of Health  ? ?Financial Resource Strain: Not on file  ?Food Insecurity: Not on file  ?Transportation Needs: Not on file  ?Physical Activity: Not on file  ?Stress: Not on file  ?Social Connections: Not on file  ?Intimate Partner Violence: Not on file  ? ? ?  ?Family History  ?Problem Relation Age of Onset  ? Heart attack Mother   ? Heart attack Father   ? ?EKG: LBBB, single PVC, NSR, first degree  AV block, QRS 154ms  ? ?Vitals:  ? 09/19/21 1134  ?BP: 110/70  ?Pulse: 88  ?SpO2: 98%  ?Weight: 94.4 kg (208 lb 3.2 oz)  ? ?Body mass index is 31.66 kg/m?. ? ?PHYSICAL EXAM: ?General:  Sitting on exam table No resp difficulty ?HEENT: normal ?Neck: supple. JVP 7-8 Carotids 2+ bilat; no bruits. No lymphadenopathy or thryomegaly appreciated. ?Cor: PMI nondisplaced. Regular rate & rhythm. No rubs, gallops or murmurs. ?Lungs: clear ?Abdomen: obese soft, nontender, nondistended.  No hepatosplenomegaly. No bruits or masses. Good bowel sounds. ?Extremities: no cyanosis, clubbing, rash, tr edema ?Neuro: alert & orientedx3, cranial nerves grossly intact. moves all 4 extremities w/o difficulty. Affect pleasant ? ? ?ASSESSMENT & PLAN:  ? 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  ?- Cath 1/23 non-obstructive CAD ?- s/p MDT ICD ?- NYHA III-IIIB ?- Volume status doesn't look too bad on exam but ReDS 45%. On torsemide 60 daily ?- Continue Toprol 25mg  daily ?- Add Jardiance 10 mg daily ?- Continue Bidil only taking 1 bid ?- Unable to tolerate other GDMT with orthostasis currently ?- Etiology of CM remains unclear. ?PVCs. With orthostasis ? amyloid ?- Check SPEP/UPEP & PYP ?- Zio to assess PVC/atrial fibrillation burden  ?- Labs today ?- Will need CPX to stratify for advanced therapies if not improving ? ?2. Paroxysmal atrial fibrillation ?- in NSR ?- continue Eliquis 5 bid ? ?3. CKD stage 3a ?- Baseline serum creatinine is 1.3. Most recent sCr elevated on BMET.  ?-Recheck BMET this clinic visit ? ?4. OSA on CPAP  ?-Continue CPAP  ? ?5. 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 ?Total time spent 50 minutes. Over half that time spent discussing above.  ? ? ?Evan Bickers, MD  ?12:19 AM ? ?

## 2021-09-19 NOTE — Patient Instructions (Addendum)
Medication Changes: ? ?Start Jardiance 10 mg daily (1/2 Tab ) ? ?Lab Work: ? ?Labs done today, your results will be available in MyChart, we will contact you for abnormal readings. ? ? ?Testing/Procedures: ? ?You have been ordered a PYP Scan.  This is done in the Radiology Department of [facility].  When you come for this test please plan to be there 2-3 hours. ?Once approved by your insurance you will be called to arrange the test. ? ?Referrals: ? ?none ? ?Special Instructions // Education: ? ?none ? ?Follow-Up in: 1 month  ? ?At the [facility], you and your health needs are our priority. We have a designated team specialized in the treatment of Heart Failure. This Care Team includes your primary Heart Failure Specialized Cardiologist (physician), Advanced Practice Providers (APPs- Physician Assistants and Nurse Practitioners), and Pharmacist who all work together to provide you with the care you need, when you need it.  ? ?You may see any of the following providers on your designated Care Team at your next follow up: ? ?Dr Glori Bickers ?Dr Loralie Champagne ?Darrick Grinder, NP ?Lyda Jester, PA ?Jessica Milford,NP ?Marlyce Huge, PA ?Audry Riles, PharmD ? ? ?Please be sure to bring in all your medications bottles to every appointment.  ? ?Need to Contact us: ? ?If you have any questions or concerns before your next appointment please send [place] a message through [place] or call our office at [phone].   ? ?TO LEAVE A MESSAGE FOR THE NURSE SELECT OPTION 2, PLEASE LEAVE A MESSAGE INCLUDING: ?YOUR NAME ?DATE OF BIRTH ?CALL BACK NUMBER ?REASON FOR CALL**this is important as we prioritize the call backs ? ?YOU WILL RECEIVE A CALL BACK THE SAME DAY AS LONG AS YOU CALL BEFORE 4:00 PM ? ? ?

## 2021-09-23 ENCOUNTER — Other Ambulatory Visit (HOSPITAL_COMMUNITY): Payer: Self-pay | Admitting: *Deleted

## 2021-09-23 LAB — MULTIPLE MYELOMA PANEL, SERUM
Albumin SerPl Elph-Mcnc: 3.2 g/dL (ref 2.9–4.4)
Albumin/Glob SerPl: 1.1 (ref 0.7–1.7)
Alpha 1: 0.3 g/dL (ref 0.0–0.4)
Alpha2 Glob SerPl Elph-Mcnc: 0.9 g/dL (ref 0.4–1.0)
B-Globulin SerPl Elph-Mcnc: 1.1 g/dL (ref 0.7–1.3)
Gamma Glob SerPl Elph-Mcnc: 1 g/dL (ref 0.4–1.8)
Globulin, Total: 3.2 g/dL (ref 2.2–3.9)
IgA: 375 mg/dL (ref 61–437)
IgG (Immunoglobin G), Serum: 1093 mg/dL (ref 603–1613)
IgM (Immunoglobulin M), Srm: 40 mg/dL (ref 15–143)
Total Protein ELP: 6.4 g/dL (ref 6.0–8.5)

## 2021-09-23 LAB — IMMUNOFIXATION, URINE

## 2021-09-23 MED ORDER — EMPAGLIFLOZIN 25 MG PO TABS: 25.0000 mg | ORAL_TABLET | Freq: Every day | ORAL | 3 refills | Status: DC

## 2021-09-24 ENCOUNTER — Other Ambulatory Visit (HOSPITAL_COMMUNITY): Payer: Self-pay | Admitting: *Deleted

## 2021-09-24 MED ORDER — TORSEMIDE 20 MG PO TABS: ORAL_TABLET | ORAL | 4 refills | Status: DC

## 2021-09-30 NOTE — Addendum Note (Signed)
Encounter addended by: Dolores Patty, MD on: 09/30/2021 12:22 AM ? Actions taken: Level of Service modified, Visit diagnoses modified, Clinical Note Signed

## 2021-10-15 ENCOUNTER — Encounter (HOSPITAL_COMMUNITY): Payer: Self-pay | Admitting: Emergency Medicine

## 2021-10-15 ENCOUNTER — Emergency Department (HOSPITAL_COMMUNITY)
Admission: EM | Admit: 2021-10-15 | Discharge: 2021-10-15 | Disposition: A | Discharge status: Home / Self Care | Payer: No Typology Code available for payment source | Attending: Emergency Medicine | Admitting: Emergency Medicine

## 2021-10-15 ENCOUNTER — Emergency Department (HOSPITAL_COMMUNITY): Payer: No Typology Code available for payment source

## 2021-10-15 ENCOUNTER — Other Ambulatory Visit: Payer: Self-pay

## 2021-10-15 DIAGNOSIS — R0609 Other forms of dyspnea: Secondary | ICD-10-CM

## 2021-10-15 DIAGNOSIS — Z7901 Long term (current) use of anticoagulants: Secondary | ICD-10-CM | POA: Diagnosis not present

## 2021-10-15 DIAGNOSIS — Z79899 Other long term (current) drug therapy: Secondary | ICD-10-CM | POA: Insufficient documentation

## 2021-10-15 DIAGNOSIS — R112 Nausea with vomiting, unspecified: Secondary | ICD-10-CM | POA: Diagnosis present

## 2021-10-15 DIAGNOSIS — I428 Other cardiomyopathies: Secondary | ICD-10-CM

## 2021-10-15 DIAGNOSIS — K429 Umbilical hernia without obstruction or gangrene: Secondary | ICD-10-CM

## 2021-10-15 DIAGNOSIS — I429 Cardiomyopathy, unspecified: Secondary | ICD-10-CM | POA: Diagnosis not present

## 2021-10-15 DIAGNOSIS — N1832 Chronic kidney disease, stage 3b: Secondary | ICD-10-CM | POA: Diagnosis not present

## 2021-10-15 DIAGNOSIS — R110 Nausea: Secondary | ICD-10-CM

## 2021-10-15 DIAGNOSIS — R0602 Shortness of breath: Secondary | ICD-10-CM | POA: Diagnosis not present

## 2021-10-15 LAB — HEPATIC FUNCTION PANEL
ALT: 16 U/L (ref 0–44)
AST: 26 U/L (ref 15–41)
Albumin: 3.3 g/dL — ABNORMAL LOW (ref 3.5–5.0)
Alkaline Phosphatase: 102 U/L (ref 38–126)
Bilirubin, Direct: 0.8 mg/dL — ABNORMAL HIGH (ref 0.0–0.2)
Indirect Bilirubin: 2.6 mg/dL — ABNORMAL HIGH (ref 0.3–0.9)
Total Bilirubin: 3.4 mg/dL — ABNORMAL HIGH (ref 0.3–1.2)
Total Protein: 6.5 g/dL (ref 6.5–8.1)

## 2021-10-15 LAB — CBC
HCT: 47.1 % (ref 39.0–52.0)
Hemoglobin: 15.5 g/dL (ref 13.0–17.0)
MCH: 27.8 pg (ref 26.0–34.0)
MCHC: 32.9 g/dL (ref 30.0–36.0)
MCV: 84.4 fL (ref 80.0–100.0)
Platelets: 146 10*3/uL — ABNORMAL LOW (ref 150–400)
RBC: 5.58 MIL/uL (ref 4.22–5.81)
RDW: 18.1 % — ABNORMAL HIGH (ref 11.5–15.5)
WBC: 6.7 10*3/uL (ref 4.0–10.5)
nRBC: 0 % (ref 0.0–0.2)

## 2021-10-15 LAB — BASIC METABOLIC PANEL
Anion gap: 8 (ref 5–15)
BUN: 18 mg/dL (ref 8–23)
CO2: 27 mmol/L (ref 22–32)
Calcium: 9.4 mg/dL (ref 8.9–10.3)
Chloride: 103 mmol/L (ref 98–111)
Creatinine, Ser: 1.87 mg/dL — ABNORMAL HIGH (ref 0.61–1.24)
GFR, Estimated: 38 mL/min — ABNORMAL LOW (ref >60)
Glucose, Bld: 78 mg/dL (ref 70–99)
Potassium: 4.2 mmol/L (ref 3.5–5.1)
Sodium: 138 mmol/L (ref 135–145)

## 2021-10-15 LAB — BRAIN NATRIURETIC PEPTIDE: B Natriuretic Peptide: 1723.9 pg/mL — ABNORMAL HIGH (ref 0.0–100.0)

## 2021-10-15 LAB — TROPONIN I (HIGH SENSITIVITY): Troponin I (High Sensitivity): 24 ng/L — ABNORMAL HIGH (ref <18)

## 2021-10-15 LAB — LIPASE, BLOOD: Lipase: 28 U/L (ref 11–51)

## 2021-10-15 MED ORDER — ACETAMINOPHEN 500 MG PO TABS
1000.0000 mg | ORAL_TABLET | Freq: Once | ORAL | Status: AC
  Administered 2021-10-15: 1000 mg via ORAL
  Filled 2021-10-15: qty 2

## 2021-10-15 MED ORDER — TORSEMIDE 20 MG PO TABS
40.0000 mg | ORAL_TABLET | Freq: Once | ORAL | Status: AC
  Administered 2021-10-15: 40 mg via ORAL
  Filled 2021-10-15: qty 2

## 2021-10-15 MED ORDER — ONDANSETRON 4 MG PO TBDP
8.0000 mg | ORAL_TABLET | Freq: Once | ORAL | Status: AC
  Administered 2021-10-15: 8 mg via ORAL
  Filled 2021-10-15: qty 2

## 2021-10-15 NOTE — ED Notes (Signed)
Notified Dr. Ashok Cordia of patient's BP 94/74. Per patient he typically has BP's in the 0000000 to [id number] systolic. Dr. Ashok Cordia aware. No orders at this time. Offering fluids.  ?

## 2021-10-15 NOTE — ED Notes (Signed)
Dr. Denton Lank at bedside evaluating. Patient's BP still 89/76 at this time however Dr. Denton Lank still proceeding on with discharge.  ?

## 2021-10-15 NOTE — ED Provider Notes (Signed)
?[facility] EMERGENCY DEPARTMENT ?Provider Note ? ? ?CSN: [id number] ?Arrival date & time: 10/15/21  1016 ? ?  ? ?History ? ?Chief Complaint  ?Patient presents with  ? Shortness of Breath  ? Emesis  ? Nausea  ? ? ?Evan Serrano is a 71 y.o. male. ? ?Patient c/o nausea since November, and as well as feeling sob, also for past several months. Denies acute or abrupt change today, but rather persistence of  symptoms. Indicates has bms 2x/week, which for him has been baseline for past couple months. Notes abd feels bloated/distended at times. States stays nauseated, but intermittent emesis - clear or color recently ingested food/liquids - no bloody or bilious emesis. Hx umbilical hernia - no increased pain or swelling to area. Notes general abd discomfort. No chest pain. No cough or uri symptoms. No fever or chills. Indicates has not been compliant w his meds in past 2-3 days, but does have adequate supply at home.  ? ?The history is provided by the patient and medical records.  ?Chest Pain ?Associated symptoms: shortness of breath and vomiting   ?Associated symptoms: no abdominal pain, no back pain, no cough, no fever and no headache   ?Shortness of Breath ?Associated symptoms: chest pain and vomiting   ?Associated symptoms: no abdominal pain, no cough, no fever, no headaches, no neck pain, no rash and no sore throat   ?Emesis ?Associated symptoms: no abdominal pain, no chills, no cough, no fever, no headaches and no sore throat   ? ?  ? ?Home Medications ?Prior to Admission medications   ?Medication Sig Start Date End Date Taking? Authorizing Provider  ?apixaban (ELIQUIS) 5 MG TABS tablet Take 1 tablet (5 mg total) by mouth 2 (two) times daily. 06/17/21  Yes Kathlen Mody, MD  ?atorvastatin (LIPITOR) 40 MG tablet Take 1 tablet (40 mg total) by mouth daily. 06/18/21  Yes Kathlen Mody, MD  ?empagliflozin (JARDIANCE) 25 MG TABS tablet Take 1 tablet (25 mg total) by mouth daily before breakfast. Take 1/2  tablet by mouth daily 09/23/21  Yes Bensimhon, Bevelyn Buckles, MD  ?isosorbide-hydrALAZINE (BIDIL) 20-37.5 MG tablet Take 1 tablet by mouth 2 (two) times daily.   Yes [provider]  ?metoprolol succinate (TOPROL XL) 25 MG 24 hr tablet Take 1 tablet (25 mg total) by mouth daily. 06/17/21 06/17/22 Yes Kathlen Mody, MD  ?Multiple Vitamin (MULTIVITAMIN WITH MINERALS) TABS tablet Take 1 tablet by mouth daily. 01/14/21  Yes Elgergawy, Leana Roe, MD  ?Nutritional Supplements (ENSURE ACTIVE PO) Take 1 Can by mouth as needed.   Yes [provider]  ?potassium chloride SA (KLOR-CON M) 20 MEQ tablet Take 1 tablet (20 mEq total) by mouth daily. 06/18/21  Yes Kathlen Mody, MD  ?torsemide (DEMADEX) 20 MG tablet Take 2 tablets (40 mg total) by mouth every morning AND 1 tablet (20 mg total) every evening. ?Patient taking differently: 40 mg every morning  AND 20 mg in the afternoon 09/24/21  Yes Bensimhon, Bevelyn Buckles, MD  ?Vitamin D, Cholecalciferol, 10 MCG (400 UNIT) CAPS Take 400 Units by mouth in the morning and at bedtime.   Yes [provider]  ?OXYGEN Inhale 2 L into the lungs continuous.    [provider]  ?   ? ?Allergies    ?Iodinated contrast media   ? ?Review of Systems   ?Review of Systems  ?Constitutional:  Negative for chills and fever.  ?HENT:  Negative for sore throat.   ?Eyes:  Negative for redness.  ?  Respiratory:  Positive for shortness of breath. Negative for cough.   ?Cardiovascular:  Positive for chest pain. Negative for leg swelling.  ?Gastrointestinal:  Positive for vomiting. Negative for abdominal pain.  ?Genitourinary:  Negative for dysuria and flank pain.  ?Musculoskeletal:  Negative for back pain and neck pain.  ?Skin:  Negative for rash.  ?Neurological:  Negative for headaches.  ?Hematological:  Does not bruise/bleed easily.  ?Psychiatric/Behavioral:  Negative for confusion.   ? ?Physical Exam ?Updated Vital Signs ?BP 94/74   Pulse 79   Temp 98.1 ?F (36.7 ?C) (Oral)   Resp 16    Ht 1.727 m (5\' 8" )   Wt 90.3 kg   SpO2 100%   BMI 30.26 kg/m?  ?Physical Exam ?Vitals and nursing note reviewed.  ?Constitutional:   ?   Appearance: Normal appearance. He is well-developed.  ?HENT:  ?   Head: Atraumatic.  ?   Nose: Nose normal.  ?   Mouth/Throat:  ?   Mouth: Mucous membranes are moist.  ?   Pharynx: Oropharynx is clear.  ?Eyes:  ?   General: No scleral icterus. ?   Conjunctiva/sclera: Conjunctivae normal.  ?   Pupils: Pupils are equal, round, and reactive to light.  ?Neck:  ?   Trachea: No tracheal deviation.  ?Cardiovascular:  ?   Rate and Rhythm: Normal rate and regular rhythm.  ?   Pulses: Normal pulses.  ?   Heart sounds: Normal heart sounds. No murmur heard. ?  No friction rub. No gallop.  ?Pulmonary:  ?   Effort: Pulmonary effort is normal. No accessory muscle usage or respiratory distress.  ?   Breath sounds: Normal breath sounds.  ?Abdominal:  ?   General: Bowel sounds are normal.  ?   Palpations: Abdomen is soft.  ?   Tenderness: There is no abdominal tenderness. There is no guarding.  ?   Comments: Mildly distended. Soft, non tender, umbilical hernia.   ?Genitourinary: ?   Comments: No cva tenderness. ?Musculoskeletal:     ?   General: No swelling or tenderness.  ?   Cervical back: Normal range of motion and neck supple. No rigidity.  ?Skin: ?   General: Skin is warm and dry.  ?   Findings: No rash.  ?Neurological:  ?   Mental Status: He is alert.  ?   Comments: Alert, speech clear.   ?Psychiatric:     ?   Mood and Affect: Mood normal.  ? ? ?ED Results / Procedures / Treatments   ?Labs ?(all labs ordered are listed, but only abnormal results are displayed) ?Results for orders placed or performed during the hospital encounter of 10/15/21  ?Basic metabolic panel  ?Result Value Ref Range  ? Sodium 138 135 - 145 mmol/L  ? Potassium 4.2 3.5 - 5.1 mmol/L  ? Chloride 103 98 - 111 mmol/L  ? CO2 27 22 - 32 mmol/L  ? Glucose, Bld 78 70 - 99 mg/dL  ? BUN 18 8 - 23 mg/dL  ? Creatinine, Ser 1.87  (H) 0.61 - 1.24 mg/dL  ? Calcium 9.4 8.9 - 10.3 mg/dL  ? GFR, Estimated 38 (L) >60 mL/min  ? Anion gap 8 5 - 15  ?CBC  ?Result Value Ref Range  ? WBC 6.7 4.0 - 10.5 K/uL  ? RBC 5.58 4.22 - 5.81 MIL/uL  ? Hemoglobin 15.5 13.0 - 17.0 g/dL  ? HCT 47.1 39.0 - 52.0 %  ? MCV 84.4 80.0 - 100.0 fL  ?  MCH 27.8 26.0 - 34.0 pg  ? MCHC 32.9 30.0 - 36.0 g/dL  ? RDW 18.1 (H) 11.5 - 15.5 %  ? Platelets 146 (L) 150 - 400 K/uL  ? nRBC 0.0 0.0 - 0.2 %  ?Troponin I (High Sensitivity)  ?Result Value Ref Range  ? Troponin I (High Sensitivity) 24 (H) <18 ng/L  ? ?CT Abdomen Pelvis Wo Contrast ? ?Result Date: 10/15/2021 ?CLINICAL DATA:  Abdominal pain, acute, nonlocalized EXAM: CT ABDOMEN AND PELVIS WITHOUT CONTRAST TECHNIQUE: Multidetector CT imaging of the abdomen and pelvis was performed following the standard protocol without IV contrast. RADIATION DOSE REDUCTION: This exam was performed according to the departmental dose-optimization program which includes automated exposure control, adjustment of the mA and/or kV according to patient size and/or use of iterative reconstruction technique. COMPARISON:  None Available. FINDINGS: Lower chest: Cardiomegaly. Hepatobiliary: No focal liver abnormality is seen. No gallstones, gallbladder wall thickening, or biliary dilatation. Pancreas: Unremarkable. No pancreatic ductal dilatation or surrounding inflammatory changes. Spleen: Normal in size without focal abnormality. Adrenals/Urinary Tract: Adrenals are unremarkable. Multiple renal cysts. Partially distended bladder is unremarkable. Stomach/Bowel: Stomach is within normal limits. Bowel is normal in caliber. Mild to moderate stool burden. Vascular/Lymphatic: Atherosclerosis.  No enlarged nodes. Reproductive: Mild enlargement of the prostate indenting the base of bladder. Other: No free fluid.  Small fat containing umbilical hernia. Musculoskeletal: Lower lumbar degenerative changes. No acute osseous abnormality. IMPRESSION: No acute  abnormality. Cardiomegaly. Aortic Atherosclerosis (ICD10-I70.0). Electronically Signed   By: Guadlupe Spanish M.D.   On: 10/15/2021 12:26  ? ?DG Chest 2 View ? ?Result Date: 10/15/2021 ?CLINICAL DATA:  Shortness of brea

## 2021-10-15 NOTE — Discharge Instructions (Addendum)
It was our pleasure to provide your ER care today - we hope that you feel better. ? ?Continue your meds as prescribed by your doctor.  ? ?For nausea, you may take zofran as need.  ? ?Follow up with primary care doctor and cardiologist in the coming week as arranged. ? ?Return to ER if worse, new symptoms, fevers, chest pain, persistent vomiting, increased trouble breathing, or other concern.  ?

## 2021-10-15 NOTE — ED Notes (Signed)
Notified Dr. Ashok Cordia that upon d/c patients BP has lowered to 82/71. Pt is asymptomatic at this time. Reconfirmed with a manual BP and obtained 88/70 reading.  ?

## 2021-10-15 NOTE — ED Triage Notes (Signed)
Pt. Stated, Ive had SOB and chest pain since January, and since Sunday Ive not been able to eat and my stomach is nausea and Im not able to eat. Ive also not able to move my bowels ?

## 2021-10-18 ENCOUNTER — Ambulatory Visit (HOSPITAL_COMMUNITY)
Admission: RE | Admit: 2021-10-18 | Discharge: 2021-10-18 | Disposition: A | Discharge status: Home / Self Care | Payer: No Typology Code available for payment source | Source: Ambulatory Visit | Attending: Family Medicine | Admitting: Family Medicine

## 2021-10-18 ENCOUNTER — Encounter (HOSPITAL_COMMUNITY): Payer: Self-pay

## 2021-10-18 VITALS — BP 96/70 | HR 150 | Wt 203.0 lb

## 2021-10-18 DIAGNOSIS — I480 Paroxysmal atrial fibrillation: Secondary | ICD-10-CM | POA: Diagnosis not present

## 2021-10-18 DIAGNOSIS — Z79899 Other long term (current) drug therapy: Secondary | ICD-10-CM | POA: Insufficient documentation

## 2021-10-18 DIAGNOSIS — I447 Left bundle-branch block, unspecified: Secondary | ICD-10-CM | POA: Diagnosis not present

## 2021-10-18 DIAGNOSIS — Z95810 Presence of automatic (implantable) cardiac defibrillator: Secondary | ICD-10-CM | POA: Insufficient documentation

## 2021-10-18 DIAGNOSIS — I5042 Chronic combined systolic (congestive) and diastolic (congestive) heart failure: Secondary | ICD-10-CM | POA: Diagnosis present

## 2021-10-18 DIAGNOSIS — I493 Ventricular premature depolarization: Secondary | ICD-10-CM

## 2021-10-18 DIAGNOSIS — I5022 Chronic systolic (congestive) heart failure: Secondary | ICD-10-CM | POA: Diagnosis not present

## 2021-10-18 DIAGNOSIS — I130 Hypertensive heart and chronic kidney disease with heart failure and stage 1 through stage 4 chronic kidney disease, or unspecified chronic kidney disease: Secondary | ICD-10-CM | POA: Diagnosis present

## 2021-10-18 DIAGNOSIS — I2510 Atherosclerotic heart disease of native coronary artery without angina pectoris: Secondary | ICD-10-CM | POA: Diagnosis not present

## 2021-10-18 DIAGNOSIS — J9611 Chronic respiratory failure with hypoxia: Secondary | ICD-10-CM | POA: Diagnosis not present

## 2021-10-18 DIAGNOSIS — N1831 Chronic kidney disease, stage 3a: Secondary | ICD-10-CM | POA: Diagnosis not present

## 2021-10-18 DIAGNOSIS — G4733 Obstructive sleep apnea (adult) (pediatric): Secondary | ICD-10-CM | POA: Diagnosis not present

## 2021-10-18 DIAGNOSIS — Z7901 Long term (current) use of anticoagulants: Secondary | ICD-10-CM | POA: Insufficient documentation

## 2021-10-18 LAB — BASIC METABOLIC PANEL
Anion gap: 10 (ref 5–15)
BUN: 23 mg/dL (ref 8–23)
CO2: 28 mmol/L (ref 22–32)
Calcium: 9.4 mg/dL (ref 8.9–10.3)
Chloride: 100 mmol/L (ref 98–111)
Creatinine, Ser: 1.93 mg/dL — ABNORMAL HIGH (ref 0.61–1.24)
GFR, Estimated: 37 mL/min — ABNORMAL LOW (ref >60)
Glucose, Bld: 126 mg/dL — ABNORMAL HIGH (ref 70–99)
Potassium: 3.7 mmol/L (ref 3.5–5.1)
Sodium: 138 mmol/L (ref 135–145)

## 2021-10-18 LAB — BRAIN NATRIURETIC PEPTIDE: B Natriuretic Peptide: 1245.3 pg/mL — ABNORMAL HIGH (ref 0.0–100.0)

## 2021-10-18 MED ORDER — TORSEMIDE 20 MG PO TABS: 80.0000 mg | ORAL_TABLET | Freq: Every morning | ORAL | 5 refills | Status: AC

## 2021-10-18 MED ORDER — POTASSIUM CHLORIDE CRYS ER 20 MEQ PO TBCR: 40.0000 meq | EXTENDED_RELEASE_TABLET | Freq: Every day | ORAL | 5 refills | Status: DC

## 2021-10-18 NOTE — Progress Notes (Signed)
Patient received new Zio monitor during todays appt as previous monitor did not record any information.  I will send email to [place] representative in order to have previous monitor credited and new one added to existing order.

## 2021-10-18 NOTE — Patient Instructions (Addendum)
EKG done today.   Labs done today. We will contact you only if your labs are abnormal.  INCREASE Torsemide to 80mg  (4 tablets) by mouth daily.  INCREASE Potassium to 40mg  (2 tablets) by mouth daily.   No other medication changes were made. Please continue all current medications as prescribed.  You have been referred to Dr. for Sleep apnea. Her office will contact you to schedule an appointment.   Your physician recommends that you schedule a follow-up appointment in: 10 days for a lab only appointment and weeks for an appointment with Dr. .   If you have any questions or concerns before your next appointment please send Mayford Knife a message through [place] or call our office at [phone].    TO LEAVE A MESSAGE FOR THE NURSE SELECT OPTION 2, PLEASE LEAVE A MESSAGE INCLUDING: YOUR NAME DATE OF BIRTH CALL BACK NUMBER REASON FOR CALL**this is important as we prioritize the call backs  YOU WILL RECEIVE A CALL BACK THE SAME DAY AS LONG AS YOU CALL BEFORE 4:00 PM   Do the following things EVERYDAY: Weigh yourself in the morning before breakfast. Write it down and keep it in a log. Take your medicines as prescribed Eat low salt foods--Limit salt (sodium) to 2000 mg per day.  Stay as active as you can everyday Limit all fluids for the day to less than 2 liters   At the [facility], you and your health needs are our priority. As part of our continuing mission to provide you with exceptional heart care, we have created designated Provider Care Teams. These Care Teams include your primary Cardiologist (physician) and Advanced Practice Providers (APPs- Physician Assistants and Nurse Practitioners) who all work together to provide you with the care you need, when you need it.   You may see any of the following providers on your designated Care Team at your next follow up: Dr [place] [address] [phone], NP Arvilla Meres, Carron Curie Robbie Lis,  PharmD   Please be sure to bring in all your medications bottles to every appointment.

## 2021-10-18 NOTE — Progress Notes (Signed)
ADVANCED HF CLINIC NOTE  Primary Care: [place]  HF Cardiologist: Dr. Haroldine Laws  HPI: Mr. Zumstein is a 71 y.o. male with HFrEF (EF 20-25%, Echo 06/2021) 2/2 NICM with ICD placed 1194, HTN complicated by orthostatic hypotension, PAF (CHADSVASC 3) on chronic AC with eliquis, OSA on CPAP, chronic hypoxemic respiratory failure on 1L of O2 at home (since 06/2021) and CKD stage IIIa.   HFrEF diagnosed [place] in [place] after patient had URI. He underwent left heart catheterization at that time and was told his coronary arteries were "clean".  On 12/26/222 had ICD (MDT) shock for an episode of ventricular fibrillation after failed ATP.   In 1/23 hospitalized with a/c HF. Echo EF 20-25%, severely dilated LV, G3 DD, severely dilated LA/RA, moderate MR and TR, mild AR. Not significantly changed from 12/2020 echo. LHC mild non obstructive CAD. ~50% mid and distal RCA. RHC: mean PAP 38, RA 9, PCWP 19  Follow up 4/23, ReDs 45%, NYHA II-IIIb. Jardiance added, Zio placed to assess PVC/AF burden. Amyloid work up initiated.  Seen in ED 10/15/21 with SOB, N/V. Had not taken po diuretics yet. Given anti-emetics and discharged.  Today he returns for HF follow up. Overall feeling dizzy. Has to hold onto walls at home. Feels occasional palpitations. He is SOB with minimal exertion. Denies abnormal bleeding, CP, edema, or PND/Orthopnea. Appetite fair. No fever or chills. Weight at home 199 pounds. Taking all medications. Unable to tolerate CPAP mask.  Cardiac Studies: - R/LHC (1/23): mild non-obs CAD, 50% mid and distal RCA RA 9 PAP mean 38 PCWP 19  - Echo (1/23): EF 20-25%  - Echo (8/22): EF 20-25%, severe LV dysfunction, grade III DD, mildly reduced RV, mild to moderate MR  Past Medical History:  Diagnosis Date   Cardiac pacemaker    COVID    Hypertension    Presence of cardiac defibrillator    Current Outpatient Medications  Medication Sig Dispense Refill   albuterol (VENTOLIN HFA) 108 (90 Base)  MCG/ACT inhaler Inhale 2 puffs into the lungs every 6 (six) hours as needed for wheezing.     apixaban (ELIQUIS) 5 MG TABS tablet Take 1 tablet (5 mg total) by mouth 2 (two) times daily. 60 tablet 3   atorvastatin (LIPITOR) 40 MG tablet Take 1 tablet (40 mg total) by mouth daily. 60 tablet 1   empagliflozin (JARDIANCE) 25 MG TABS tablet Take 1 tablet (25 mg total) by mouth daily before breakfast. Take 1/2 tablet by mouth daily (Patient taking differently: Take 12.5 mg by mouth daily.) 45 tablet 3   Glucerna (GLUCERNA) LIQD Take 237 mLs by mouth 4 (four) times daily.     isosorbide-hydrALAZINE (BIDIL) 20-37.5 MG tablet Take 1.5 tablets by mouth 2 (two) times daily.     loratadine (CLARITIN) 10 MG tablet Take 10 mg by mouth daily.     metoprolol succinate (TOPROL XL) 25 MG 24 hr tablet Take 1 tablet (25 mg total) by mouth daily. 30 tablet 11   Multiple Vitamin (MULTIVITAMIN WITH MINERALS) TABS tablet Take 1 tablet by mouth daily.     OXYGEN Inhale 2 L into the lungs continuous.     potassium chloride SA (KLOR-CON M) 20 MEQ tablet Take 1 tablet (20 mEq total) by mouth daily. 30 tablet 1   torsemide (DEMADEX) 20 MG tablet Take 2 tablets (40 mg total) by mouth every morning AND 1 tablet (20 mg total) every evening. (Patient taking differently: 40 mg every morning  AND 20 mg in  the afternoon) 180 tablet 4   Vitamin D, Cholecalciferol, 10 MCG (400 UNIT) CAPS Take 800 Units by mouth daily.     No current facility-administered medications for this encounter.   Allergies  Allergen Reactions   Iodinated Contrast Media Shortness Of Breath and Rash    Other reaction(s): Cough   Social History   Socioeconomic History   Marital status: Married    Spouse name: Not on file   Number of children: Not on file   Years of education: Not on file   Highest education level: Not on file  Occupational History   Not on file  Tobacco Use   Smoking status: Never   Smokeless tobacco: Never  Vaping Use   Vaping  Use: Never used  Substance and Sexual Activity   Alcohol use: Never   Drug use: Never   Sexual activity: Not Currently  Other Topics Concern   Not on file  Social History Narrative   Not on file   Social Determinants of Health   Financial Resource Strain: Not on file  Food Insecurity: Not on file  Transportation Needs: Not on file  Physical Activity: Not on file  Stress: Not on file  Social Connections: Not on file  Intimate Partner Violence: Not on file   Family History  Problem Relation Age of Onset   Heart attack Mother    Heart attack Father    BP 96/70   Pulse (!) 150   Wt 92.1 kg (203 lb)   SpO2 99%   BMI 30.87 kg/m   Wt Readings from Last 3 Encounters:  10/18/21 92.1 kg (203 lb)  10/15/21 90.3 kg (199 lb)  09/19/21 94.4 kg (208 lb 3.2 oz)   PHYSICAL EXAM: General:  NAD. No resp difficulty, walked into clinic with cane HEENT: Normal Neck: Supple. JVP 8-9. Carotids 2+ bilat; no bruits. No lymphadenopathy or thryomegaly appreciated. Cor: PMI nondisplaced. Regular rate & rhythm. No rubs, gallops or murmurs. Lungs: Clear Abdomen: Soft, nontender, nondistended. No hepatosplenomegaly. No bruits or masses. Good bowel sounds. Extremities: No cyanosis, clubbing, rash, edema Neuro: Alert & oriented x 3, cranial nerves grossly intact. Moves all 4 extremities w/o difficulty. Affect pleasant.  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  - Cath (1/23): non-obstructive CAD - s/p MDT ICD. - NYHA III-IIIb. Volume status up. GDMT limited by low BP and CKD. - No BP room for ARB/ARNI. - Increase torsemide to 80 mg daily + extra 20 KCL (40 meQ total daily). - Decrease BiDil to 1 tablet bid to allow for diuresis. - Continue Toprol XL 25 mg daily. - Continue Jardiance 12.5 mg daily. - Etiology of CM remains unclear. ? PVCs.  With orthostasis ? Amyloid. - Multiple myeloma panel & urine immunofixation negative, PYP arranged for next week. - Re-order Zio 7-day to assess PVC/atrial fibrillation burden (previous fell off). - Labs today, BMET in 10 days. - Will need CPX to stratify for advanced therapies if not improving - Unable to interrogate ICD in clinic today.  2. Paroxysmal atrial fibrillation - Regular on exam today. - Continue Eliquis 5 mg bid.  3. CKD stage 3a - Baseline SCr 1.3. - Labs today.  4. OSA on CPAP  - Needs better-fitting CPAP mask. - Refer to Pulmonary.  5. CAD  - non obstructive on LHC  1/23. - No s/s angina - No ASA with Eliquis  - Continue atorvastatin.  Will need to follow closely. Follow up with Dr. Haroldine Laws in 6 weeks.  Rafael Bihari, FNP  1:29 PM

## 2021-10-21 ENCOUNTER — Encounter (HOSPITAL_COMMUNITY)
Admission: RE | Admit: 2021-10-21 | Discharge: 2021-10-21 | Disposition: A | Discharge status: Home / Self Care | Payer: No Typology Code available for payment source | Source: Ambulatory Visit | Attending: Internal Medicine | Admitting: Internal Medicine

## 2021-10-21 ENCOUNTER — Encounter (HOSPITAL_COMMUNITY): Payer: No Typology Code available for payment source

## 2021-10-21 DIAGNOSIS — I5022 Chronic systolic (congestive) heart failure: Secondary | ICD-10-CM | POA: Insufficient documentation

## 2021-10-29 ENCOUNTER — Other Ambulatory Visit (HOSPITAL_COMMUNITY): Payer: No Typology Code available for payment source

## 2021-10-30 ENCOUNTER — Encounter (HOSPITAL_COMMUNITY)
Admission: RE | Admit: 2021-10-30 | Discharge: 2021-10-30 | Disposition: A | Discharge status: Home / Self Care | Payer: No Typology Code available for payment source | Source: Ambulatory Visit | Attending: Internal Medicine | Admitting: Internal Medicine

## 2021-10-30 DIAGNOSIS — I5022 Chronic systolic (congestive) heart failure: Secondary | ICD-10-CM | POA: Insufficient documentation

## 2021-10-30 MED ORDER — TECHNETIUM TC 99M PYROPHOSPHATE
20.4000 | Freq: Once | INTRAVENOUS | Status: AC | PRN
  Administered 2021-10-30: 20.4 via INTRAVENOUS

## 2021-11-04 NOTE — Addendum Note (Signed)
Encounter addended by: Micki Riley, RN on: 11/04/2021 10:39 AM  Actions taken: Imaging Exam ended

## 2021-11-05 ENCOUNTER — Ambulatory Visit (INDEPENDENT_AMBULATORY_CARE_PROVIDER_SITE_OTHER): Payer: No Typology Code available for payment source | Admitting: Pulmonary Disease

## 2021-11-05 ENCOUNTER — Encounter: Payer: Self-pay | Admitting: Pulmonary Disease

## 2021-11-05 VITALS — BP 118/86 | HR 64 | Ht 68.0 in | Wt 200.3 lb

## 2021-11-05 DIAGNOSIS — G4733 Obstructive sleep apnea (adult) (pediatric): Secondary | ICD-10-CM

## 2021-11-05 DIAGNOSIS — R0602 Shortness of breath: Secondary | ICD-10-CM

## 2021-11-05 MED ORDER — FLUTICASONE-SALMETEROL 250-50 MCG/ACT IN AEPB: 1.0000 | INHALATION_SPRAY | Freq: Two times a day (BID) | RESPIRATORY_TRACT | 6 refills | Status: DC

## 2021-11-05 NOTE — Patient Instructions (Addendum)
We will request your CT chest scan records from the [facility]  Start advair dikus inhaler 1 puff twice daily - rinse mouth out after each use  Continue to use albuterol inhaler as needed  Follow up in 1-2 months for pulmonary function tests

## 2021-11-05 NOTE — Progress Notes (Signed)
Synopsis: Referred in June 2023 for cough by Freada Bergeron, MD  Subjective:   PATIENT ID: Evan Serrano GENDER: male DOB: Feb 08, 1951, MRN: [id number]  HPI  Chief Complaint  Patient presents with   Consult    Referred by [facility] for chronic cough for the past 2 years. States the cough is productive with clear phlegm. Uses 2L of O2 at home.    Evan Serrano is a 71 year old male, never smoker with hypertension, HFrEF of 20-25%, orthostatic hypotension, paroxysmal atrial fibrillation, OSA on CPAP, CKDIIIa and chronic hypoxemic respiratory failure who is referred to pulmonary clinic for cough.   He was seen by cardiology 10/18/21, note reviewed. NM Cardiac scan is equivocal for transthyretin amyloidosis.   He reports coughing fits over the past 2 years that can lead to dyspnea and post-tussive emesis. He produces clear mucous. He denies much wheezing. He does have seasonal allergies. He was recently started on zyrtec. He is using albuterol as needed with some relief. He is using 2L of oxygen which he was started on from his admission 06/2021. He has not been using CPAP since last year. He has been scheduled for CPAP evaluation by the cardiology team.   He reports having a CT Chest scan at the [place] earlier this year.   He was in the [employer]. 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 He was in [place] in the 1970s.   FH: Heart failure - Mother/father. Oldest sister with heart failure  Past Medical History:  Diagnosis Date   Cardiac pacemaker    COVID    Hypertension    Presence of cardiac defibrillator      Family History  Problem Relation Age of Onset   Heart attack Mother    Heart attack Father      Social History   Socioeconomic History   Marital status: Married    Spouse name: Not on file   Number of children: Not on file   Years of education: Not on file   Highest education level: Not on file  Occupational History    Not on file  Tobacco Use   Smoking status: Never   Smokeless tobacco: Never  Vaping Use   Vaping Use: Never used  Substance and Sexual Activity   Alcohol use: Never   Drug use: Never   Sexual activity: Not Currently  Other Topics Concern   Not on file  Social History Narrative   Not on file   Social Determinants of Health   Financial Resource Strain: Not on file  Food Insecurity: Not on file  Transportation Needs: Not on file  Physical Activity: Not on file  Stress: Not on file  Social Connections: Not on file  Intimate Partner Violence: Not on file     Allergies  Allergen Reactions   Iodinated Contrast Media Shortness Of Breath and Rash    Other reaction(s): Cough     Outpatient Medications Prior to Visit  Medication Sig Dispense Refill   albuterol (VENTOLIN HFA) 108 (90 Base) MCG/ACT inhaler Inhale 2 puffs into the lungs every 6 (six) hours as needed for wheezing.     apixaban (ELIQUIS) 5 MG TABS tablet Take 1 tablet (5 mg total) by mouth 2 (two) times daily. 60 tablet 3   atorvastatin (LIPITOR) 40 MG tablet Take 1 tablet (40 mg total) by mouth daily. 60 tablet 1   empagliflozin (JARDIANCE) 25 MG TABS tablet Take 1  tablet (25 mg total) by mouth daily before breakfast. Take 1/2 tablet by mouth daily (Patient taking differently: Take 12.5 mg by mouth daily.) 45 tablet 3   Glucerna (GLUCERNA) LIQD Take 237 mLs by mouth 4 (four) times daily.     isosorbide-hydrALAZINE (BIDIL) 20-37.5 MG tablet Take 1.5 tablets by mouth 2 (two) times daily.     loratadine (CLARITIN) 10 MG tablet Take 10 mg by mouth daily.     metoprolol succinate (TOPROL XL) 25 MG 24 hr tablet Take 1 tablet (25 mg total) by mouth daily. 30 tablet 11   Multiple Vitamin (MULTIVITAMIN WITH MINERALS) TABS tablet Take 1 tablet by mouth daily.     OXYGEN Inhale 2 L into the lungs continuous.     potassium chloride SA (KLOR-CON M) 20 MEQ tablet Take 2 tablets (40 mEq total) by mouth daily. 60 tablet 5    torsemide (DEMADEX) 20 MG tablet Take 4 tablets (80 mg total) by mouth every morning. 120 tablet 5   Vitamin D, Cholecalciferol, 10 MCG (400 UNIT) CAPS Take 800 Units by mouth daily.     No facility-administered medications prior to visit.   Review of Systems  Constitutional:  Negative for chills, fever, malaise/fatigue and weight loss.  HENT:  Negative for congestion, sinus pain and sore throat.   Eyes: Negative.   Respiratory:  Positive for cough, sputum production and shortness of breath. Negative for hemoptysis and wheezing.   Cardiovascular:  Negative for chest pain, palpitations, orthopnea, claudication and leg swelling.  Gastrointestinal:  Negative for abdominal pain, heartburn, nausea and vomiting.  Genitourinary: Negative.   Musculoskeletal:  Negative for joint pain and myalgias.  Skin:  Negative for rash.  Neurological:  Negative for weakness.  Endo/Heme/Allergies: Negative.   Psychiatric/Behavioral: Negative.     Objective:   Vitals:   11/05/21 1407  BP: 118/86  Pulse: 64  SpO2: 94%  Weight: 200 lb 4.8 oz (90.9 kg)  Height: 5\' 8"  (1.727 m)   Physical Exam Constitutional:      General: He is not in acute distress. HENT:     Head: Normocephalic and atraumatic.  Eyes:     Extraocular Movements: Extraocular movements intact.     Conjunctiva/sclera: Conjunctivae normal.     Pupils: Pupils are equal, round, and reactive to light.  Cardiovascular:     Rate and Rhythm: Normal rate and regular rhythm.     Pulses: Normal pulses.     Heart sounds: Normal heart sounds. No murmur heard. Pulmonary:     Effort: Pulmonary effort is normal.     Breath sounds: Normal breath sounds. No wheezing or rhonchi.  Abdominal:     General: Bowel sounds are normal.     Palpations: Abdomen is soft.  Musculoskeletal:     Right lower leg: No edema.     Left lower leg: No edema.  Lymphadenopathy:     Cervical: No cervical adenopathy.  Skin:    General: Skin is warm and dry.   Neurological:     General: No focal deficit present.     Mental Status: He is alert.  Psychiatric:        Mood and Affect: Mood normal.        Behavior: Behavior normal.        Thought Content: Thought content normal.        Judgment: Judgment normal.   CBC    Component Value Date/Time   WBC 6.7 10/15/2021 1110   RBC 5.58 10/15/2021 1110  HGB 15.5 10/15/2021 1110   HCT 47.1 10/15/2021 1110   PLT 146 (L) 10/15/2021 1110   MCV 84.4 10/15/2021 1110   MCH 27.8 10/15/2021 1110   MCHC 32.9 10/15/2021 1110   RDW 18.1 (H) 10/15/2021 1110   LYMPHSABS 2.0 04/19/2019 0459   MONOABS 0.7 04/19/2019 0459   EOSABS 0.0 04/19/2019 0459   BASOSABS 0.0 04/19/2019 0459      Latest Ref Rng & Units 10/18/2021    2:00 PM 10/15/2021   11:10 AM 09/19/2021    1:14 PM  BMP  Glucose 70 - 99 mg/dL 353   78   83    BUN 8 - 23 mg/dL 23   18   16     Creatinine 0.61 - 1.24 mg/dL   6.14   4.31    Sodium 135 - 145 mmol/L 138   138   138    Potassium 3.5 - 5.1 mmol/L 3.7   4.2   4.1    Chloride 98 - 111 mmol/L 100   103   102    CO2 22 - 32 mmol/L 28   27   26     Calcium 8.9 - 10.3 mg/dL 9.4   9.4   9.6     Chest imaging: CXR 10/15/21 Stable cardiomegaly. Single lead left-sided defibrillator is unchanged in position. Both lungs are clear. The visualized skeletal structures are unremarkable.  PFT:     View : No data to display.          Labs:  Path:  Echo:  Heart Catheterization:  Assessment & Plan:   Obstructive sleep apnea syndrome  Shortness of breath - Plan: Pulmonary Function Test, fluticasone-salmeterol (ADVAIR DISKUS) 250-50 MCG/ACT AEPB  Discussion: Rodriques Badie is a 71 year old male, never smoker with hypertension, HFrEF of 20-25%, orthostatic hypotension, paroxysmal atrial fibrillation, OSA on CPAP, CKDIIIa and chronic hypoxemic respiratory failure who is referred to pulmonary clinic for cough.   His cough may be related to reactive airways disease or possibly  GERD. He does not have overt reflux symptoms so we will hold off on antacid medications at this time.   We will try him on advair diskus 250-50 mcg 1 puff twice daily and continue as needed albuterol.  We will request CT Scan results from the Evan Serrano.  He is to follow up in 1-2 months for pulmonary function tests.  61, MD [facility] Office: [phone]   Current Outpatient Medications:    albuterol (VENTOLIN HFA) 108 (90 Base) MCG/ACT inhaler, Inhale 2 puffs into the lungs every 6 (six) hours as needed for wheezing., Disp: , Rfl:    apixaban (ELIQUIS) 5 MG TABS tablet, Take 1 tablet (5 mg total) by mouth 2 (two) times daily., Disp: 60 tablet, Rfl: 3   atorvastatin (LIPITOR) 40 MG tablet, Take 1 tablet (40 mg total) by mouth daily., Disp: 60 tablet, Rfl: 1   empagliflozin (JARDIANCE) 25 MG TABS tablet, Take 1 tablet (25 mg total) by mouth daily before breakfast. Take 1/2 tablet by mouth daily (Patient taking differently: Take 12.5 mg by mouth daily.), Disp: 45 tablet, Rfl: 3   fluticasone-salmeterol (ADVAIR DISKUS) 250-50 MCG/ACT AEPB, Inhale 1 puff into the lungs in the morning and at bedtime., Disp: 60 each, Rfl: 6   Glucerna (GLUCERNA) LIQD, Take 237 mLs by mouth 4 (four) times daily., Disp: , Rfl:    isosorbide-hydrALAZINE (BIDIL) 20-37.5 MG tablet, Take 1.5 tablets by mouth 2 (two) times daily., Disp: ,  Rfl:    loratadine (CLARITIN) 10 MG tablet, Take 10 mg by mouth daily., Disp: , Rfl:    metoprolol succinate (TOPROL XL) 25 MG 24 hr tablet, Take 1 tablet (25 mg total) by mouth daily., Disp: 30 tablet, Rfl: 11   Multiple Vitamin (MULTIVITAMIN WITH MINERALS) TABS tablet, Take 1 tablet by mouth daily., Disp: , Rfl:    OXYGEN, Inhale 2 L into the lungs continuous., Disp: , Rfl:    potassium chloride SA (KLOR-CON M) 20 MEQ tablet, Take 2 tablets (40 mEq total) by mouth daily., Disp: 60 tablet, Rfl: 5   torsemide (DEMADEX) 20 MG tablet, Take 4 tablets (80 mg  total) by mouth every morning., Disp: 120 tablet, Rfl: 5   Vitamin D, Cholecalciferol, 10 MCG (400 UNIT) CAPS, Take 800 Units by mouth daily., Disp: , Rfl:

## 2021-11-14 ENCOUNTER — Ambulatory Visit (INDEPENDENT_AMBULATORY_CARE_PROVIDER_SITE_OTHER): Payer: No Typology Code available for payment source | Admitting: Cardiology

## 2021-11-14 ENCOUNTER — Encounter: Payer: Self-pay | Admitting: Cardiology

## 2021-11-14 VITALS — BP 110/66 | HR 108 | Ht 68.0 in | Wt 192.2 lb

## 2021-11-14 DIAGNOSIS — I10 Essential (primary) hypertension: Secondary | ICD-10-CM

## 2021-11-14 DIAGNOSIS — G4733 Obstructive sleep apnea (adult) (pediatric): Secondary | ICD-10-CM | POA: Diagnosis not present

## 2021-11-14 NOTE — Patient Instructions (Signed)
Medication Instructions:  The current medical regimen is effective;  continue present plan and medications.  *If you need a refill on your cardiac medications before your next appointment, please call your pharmacy*  Testing/Procedures: Your physician has recommended that you have a sleep study. This test records several body functions during sleep, including: brain activity, eye movement, oxygen and carbon dioxide blood levels, heart rate and rhythm, breathing rate and rhythm, the flow of air through your mouth and nose, snoring, body muscle movements, and chest and belly movement.   Follow-Up: At [facility], you and your health needs are our priority.  As part of our continuing mission to provide you with exceptional heart care, we have created designated Provider Care Teams.  These Care Teams include your primary Cardiologist (physician) and Advanced Practice Providers (APPs -  Physician Assistants and Nurse Practitioners) who all work together to provide you with the care you need, when you need it.  We recommend signing up for the patient portal called "MyChart".  Sign up information is provided on this After Visit Summary.  MyChart is used to connect with patients for Virtual Visits (Telemedicine).  Patients are able to view lab/test results, encounter notes, upcoming appointments, etc.  Non-urgent messages can be sent to your provider as well.   To learn more about what you can do with MyChart, go to [email or web address].    Your next appointment:   Follow up to be determined after sleep study   Important Information About Sugar

## 2021-11-14 NOTE — Addendum Note (Signed)
Addended by: Sharin Grave on: 11/14/2021 02:23 PM   Modules accepted: Orders

## 2021-11-14 NOTE — Progress Notes (Addendum)
Sleep medicine CONSULT Note    Date:  11/14/2021   ID:  Evan Serrano, DOB 1951-02-05, MRN [id number]  PCP:  [facility]  Cardiologist:  Fransico Him, MD   Chief Complaint  Patient presents with   New Patient (Initial Visit)    Obstructive sleep apnea    History of Present Illness:  Evan Serrano is a 71 y.o. male who is being seen today for the evaluation of obstructive sleep apnea at the request of Blomkest, Maricela Bo, FNP.  This is a 71 year old male with a history of HFrEF with EF 20 to 25% secondary to nonischemic cardiomyopathy, hypertension complicated by orthostatic hypotension, PAF on chronic anticoagulation, CKD and obstructive sleep apnea.  He is followed in advanced heart failure clinic.  He has a history of obstructive sleep apnea in the past dx in 2011 at the [facility] hospital when he was dx with PNA.  He was started on CPAP in 2012 but has not been able to tolerate his CPAP device and has not used in over a year.  He was placed on O2 in place of the CPAP. He is now referred for sleep evaluation.    He feels tired when he gets up in the am and has daytime sleepiness and naps on and off during the day.  Some of his fatigue he thinks is related to his heart failure.  He does not wake up snoring but wakes up gasping for breath (this occurred on CPAP as well).  He also says that his heart beat gets too low at night while sleeping and he wakes up SOB.   He tells me that he is a stomach sleeper and cannot tolerate a PAP mask because it will not stay in position.   Past Medical History:  Diagnosis Date   Cardiac pacemaker    COVID    Hypertension    Presence of cardiac defibrillator     Past Surgical History:  Procedure Laterality Date   PACEMAKER IMPLANT     RIGHT/LEFT HEART CATH AND CORONARY ANGIOGRAPHY N/A 06/14/2021   Procedure: RIGHT/LEFT HEART CATH AND CORONARY ANGIOGRAPHY;  Surgeon: Sherren Mocha, MD;  Location: [facility];  Service:  Cardiovascular;  Laterality: N/A;    Current Medications: Current Meds  Medication Sig   albuterol (VENTOLIN HFA) 108 (90 Base) MCG/ACT inhaler Inhale 2 puffs into the lungs every 6 (six) hours as needed for wheezing.   apixaban (ELIQUIS) 5 MG TABS tablet Take 1 tablet (5 mg total) by mouth 2 (two) times daily.   atorvastatin (LIPITOR) 40 MG tablet Take 1 tablet (40 mg total) by mouth daily.   empagliflozin (JARDIANCE) 25 MG TABS tablet Take 1 tablet (25 mg total) by mouth daily before breakfast. Take 1/2 tablet by mouth daily (Patient taking differently: Take 12.5 mg by mouth daily.)   fluticasone-salmeterol (ADVAIR DISKUS) 250-50 MCG/ACT AEPB Inhale 1 puff into the lungs in the morning and at bedtime.   Glucerna (GLUCERNA) LIQD Take 237 mLs by mouth 4 (four) times daily.   isosorbide-hydrALAZINE (BIDIL) 20-37.5 MG tablet Take 1.5 tablets by mouth 2 (two) times daily.   loratadine (CLARITIN) 10 MG tablet Take 10 mg by mouth daily.   metoprolol succinate (TOPROL XL) 25 MG 24 hr tablet Take 1 tablet (25 mg total) by mouth daily.   Multiple Vitamin (MULTIVITAMIN WITH MINERALS) TABS tablet Take 1 tablet by mouth daily.   OXYGEN Inhale 2 L into the lungs continuous.   potassium chloride SA (KLOR-CON  M) 20 MEQ tablet Take 2 tablets (40 mEq total) by mouth daily.   torsemide (DEMADEX) 20 MG tablet Take 4 tablets (80 mg total) by mouth every morning.   Vitamin D, Cholecalciferol, 10 MCG (400 UNIT) CAPS Take 800 Units by mouth daily.    Allergies:   Iodinated contrast media   Social History   Socioeconomic History   Marital status: Married    Spouse name: Not on file   Number of children: Not on file   Years of education: Not on file   Highest education level: Not on file  Occupational History   Not on file  Tobacco Use   Smoking status: Never   Smokeless tobacco: Never  Vaping Use   Vaping Use: Never used  Substance and Sexual Activity   Alcohol use: Never   Drug use: Never    Sexual activity: Not Currently  Other Topics Concern   Not on file  Social History Narrative   Not on file   Social Determinants of Health   Financial Resource Strain: Not on file  Food Insecurity: No Food Insecurity (04/21/2019)   Hunger Vital Sign    Worried About Running Out of Food in the Last Year: Never true    Ran Out of Food in the Last Year: Never true  Transportation Needs: No Transportation Needs (04/21/2019)   PRAPARE - Administrator, Civil Service (Medical): No    Lack of Transportation (Non-Medical): No  Physical Activity: Inactive (04/21/2019)   Exercise Vital Sign    Days of Exercise per Week: 0 days    Minutes of Exercise per Session: 0 min  Stress: No Stress Concern Present (04/21/2019)   [employer] of Occupational Health - Occupational Stress Questionnaire    Feeling of Stress : Only a little  Social Connections: Moderately Integrated (04/21/2019)   Social Connection and Isolation Panel [NHANES]    Frequency of Communication with Friends and Family: Three times a week    Frequency of Social Gatherings with Friends and Family: Once a week    Attends Religious Services: More than 4 times per year    Active Member of [employer] or Organizations: No    Attends Banker Meetings: Never    Marital Status: Married     Family History:  The patient's family history includes Heart attack in his father and mother.   ROS:   Please see the history of present illness.    ROS All other systems reviewed and are negative.      No data to display             PHYSICAL EXAM:   VS:  BP 110/66   Pulse (!) 108   Ht 5\' 8"  (1.727 m)   Wt 192 lb 3.2 oz (87.2 kg)   SpO2 96%   BMI 29.22 kg/m    GEN: Well nourished, well developed, in no acute distress  HEENT: normal  Neck: no JVD, carotid bruits, or masses Cardiac: RRR; no murmurs, rubs, or gallops,no edema.  Intact distal pulses bilaterally.  Respiratory:  clear to auscultation  bilaterally, normal work of breathing GI: soft, nontender, nondistended, + BS MS: no deformity or atrophy  Skin: warm and dry, no rash Neuro:  Alert and Oriented x 3, Strength and sensation are intact Psych: euthymic mood, full affect  Wt Readings from Last 3 Encounters:  11/14/21 192 lb 3.2 oz (87.2 kg)  11/05/21 200 lb 4.8 oz (90.9 kg)  10/18/21 203 lb (  92.1 kg)      Studies/Labs Reviewed:   EKG:  EKG is not ordered today.    Recent Labs: 06/14/2021: TSH 3.168 09/19/2021: Magnesium 2.0 10/15/2021: ALT 16; Hemoglobin 15.5; Platelets 146 10/18/2021: B Natriuretic Peptide 1,245.3; BUN 23; Creatinine, Ser 1.93; Potassium 3.7; Sodium 138   Lipid Panel    Component Value Date/Time   CHOL 108 06/15/2021 0353   TRIG 52 06/15/2021 0353   HDL 27 (L) 06/15/2021 0353   CHOLHDL 4.0 06/15/2021 0353   VLDL 10 06/15/2021 0353   LDLCALC 71 06/15/2021 0353     CHA2DS2-VASc Score = 3   This indicates a 3.2% annual risk of stroke. The patient's score is based upon: CHF History: 1 HTN History: 1 Diabetes History: 0 Stroke History: 0 Vascular Disease History: 0 Age Score: 1 Gender Score: 0    Additional studies/ records that were reviewed today include:  Notes from advanced heart failure    ASSESSMENT:    1. Obstructive sleep apnea syndrome   2. Primary hypertension      PLAN:  In order of problems listed above:  OSA -He has been intolerant to CPAP therapy for some time and has not been using it. -I do not have a recent sleep study on him to know how severe his sleep apnea is. -His BMI of less than 32 would qualify him for the inspire device from sleep standpoint but will need to check with Dr. Gala Romney if he would be stable enough from cardiac standpoint for Inspire device and if any issues with current AICD he has in place. -I have recommended that we get an in lab PSG (does not have smart phone for [place]) to determine the severity of his sleep apnea.  If he has an AHI  of greater than 15/h then he would qualify for the inspire device and we could refer him to ENT as he is intolerant to CPAP therapy if ok with EP and AHF service.  2.  Hypertension -Continue prescription drug therapy with BiDil 20-37.5 mg 1.5 tablets twice daily, Toprol-XL 25 mg daily with as needed refills  Time Spent: 20 minutes total time of encounter, including 15 minutes spent in face-to-face patient care on the date of this encounter. This time includes coordination of care and counseling regarding above mentioned problem list. Remainder of non-face-to-face time involved reviewing chart documents/testing relevant to the patient encounter and documentation in the medical record. I have independently reviewed documentation from referring provider  Medication Adjustments/Labs and Tests Ordered: Current medicines are reviewed at length with the patient today.  Concerns regarding medicines are outlined above.  Medication changes, Labs and Tests ordered today are listed in the Patient Instructions below.  There are no Patient Instructions on file for this visit.   Signed, Armanda Magic, MD [facility], [facility] 11/14/2021 2:05 PM    [facility] [address] [place], [place], [place]  [address] Phone: [phone]; Fax: [phone]

## 2021-11-19 ENCOUNTER — Ambulatory Visit (HOSPITAL_COMMUNITY)
Admission: RE | Admit: 2021-11-19 | Discharge: 2021-11-19 | Disposition: A | Discharge status: Home / Self Care | Payer: Medicare PPO | Source: Ambulatory Visit | Attending: Internal Medicine | Admitting: Internal Medicine

## 2021-11-19 ENCOUNTER — Encounter (HOSPITAL_COMMUNITY): Payer: Self-pay | Admitting: Internal Medicine

## 2021-11-19 VITALS — BP 90/50 | HR 95 | Wt 194.0 lb

## 2021-11-19 DIAGNOSIS — I5022 Chronic systolic (congestive) heart failure: Secondary | ICD-10-CM

## 2021-11-19 DIAGNOSIS — I493 Ventricular premature depolarization: Secondary | ICD-10-CM

## 2021-11-19 DIAGNOSIS — Z79899 Other long term (current) drug therapy: Secondary | ICD-10-CM | POA: Insufficient documentation

## 2021-11-19 DIAGNOSIS — G4733 Obstructive sleep apnea (adult) (pediatric): Secondary | ICD-10-CM | POA: Diagnosis not present

## 2021-11-19 DIAGNOSIS — Z7984 Long term (current) use of oral hypoglycemic drugs: Secondary | ICD-10-CM | POA: Insufficient documentation

## 2021-11-19 DIAGNOSIS — I428 Other cardiomyopathies: Secondary | ICD-10-CM | POA: Insufficient documentation

## 2021-11-19 DIAGNOSIS — Z7901 Long term (current) use of anticoagulants: Secondary | ICD-10-CM | POA: Diagnosis not present

## 2021-11-19 DIAGNOSIS — Z9981 Dependence on supplemental oxygen: Secondary | ICD-10-CM | POA: Insufficient documentation

## 2021-11-19 DIAGNOSIS — I472 Ventricular tachycardia, unspecified: Secondary | ICD-10-CM | POA: Insufficient documentation

## 2021-11-19 DIAGNOSIS — Z7902 Long term (current) use of antithrombotics/antiplatelets: Secondary | ICD-10-CM | POA: Diagnosis not present

## 2021-11-19 DIAGNOSIS — R531 Weakness: Secondary | ICD-10-CM | POA: Insufficient documentation

## 2021-11-19 DIAGNOSIS — I130 Hypertensive heart and chronic kidney disease with heart failure and stage 1 through stage 4 chronic kidney disease, or unspecified chronic kidney disease: Secondary | ICD-10-CM | POA: Insufficient documentation

## 2021-11-19 DIAGNOSIS — I480 Paroxysmal atrial fibrillation: Secondary | ICD-10-CM

## 2021-11-19 DIAGNOSIS — I5042 Chronic combined systolic (congestive) and diastolic (congestive) heart failure: Secondary | ICD-10-CM | POA: Insufficient documentation

## 2021-11-19 DIAGNOSIS — N1832 Chronic kidney disease, stage 3b: Secondary | ICD-10-CM | POA: Insufficient documentation

## 2021-11-19 DIAGNOSIS — Z95810 Presence of automatic (implantable) cardiac defibrillator: Secondary | ICD-10-CM | POA: Diagnosis not present

## 2021-11-19 DIAGNOSIS — I441 Atrioventricular block, second degree: Secondary | ICD-10-CM | POA: Insufficient documentation

## 2021-11-19 DIAGNOSIS — I2510 Atherosclerotic heart disease of native coronary artery without angina pectoris: Secondary | ICD-10-CM | POA: Diagnosis not present

## 2021-11-19 DIAGNOSIS — J9611 Chronic respiratory failure with hypoxia: Secondary | ICD-10-CM | POA: Insufficient documentation

## 2021-11-19 LAB — CBC
HCT: 50 % (ref 39.0–52.0)
Hemoglobin: 16.3 g/dL (ref 13.0–17.0)
MCH: 27.3 pg (ref 26.0–34.0)
MCHC: 32.6 g/dL (ref 30.0–36.0)
MCV: 83.6 fL (ref 80.0–100.0)
Platelets: 174 10*3/uL (ref 150–400)
RBC: 5.98 MIL/uL — ABNORMAL HIGH (ref 4.22–5.81)
RDW: 17.7 % — ABNORMAL HIGH (ref 11.5–15.5)
WBC: 7.4 10*3/uL (ref 4.0–10.5)
nRBC: 0 % (ref 0.0–0.2)

## 2021-11-19 LAB — BASIC METABOLIC PANEL
Anion gap: 12 (ref 5–15)
BUN: 26 mg/dL — ABNORMAL HIGH (ref 8–23)
CO2: 26 mmol/L (ref 22–32)
Calcium: 10 mg/dL (ref 8.9–10.3)
Chloride: 98 mmol/L (ref 98–111)
Creatinine, Ser: 1.73 mg/dL — ABNORMAL HIGH (ref 0.61–1.24)
GFR, Estimated: 42 mL/min — ABNORMAL LOW (ref >60)
Glucose, Bld: 88 mg/dL (ref 70–99)
Potassium: 4.9 mmol/L (ref 3.5–5.1)
Sodium: 136 mmol/L (ref 135–145)

## 2021-11-19 LAB — BRAIN NATRIURETIC PEPTIDE: B Natriuretic Peptide: 1485.7 pg/mL — ABNORMAL HIGH (ref 0.0–100.0)

## 2021-11-19 MED ORDER — AMIODARONE HCL 200 MG PO TABS: 200.0000 mg | ORAL_TABLET | Freq: Every day | ORAL | 3 refills | Status: DC

## 2021-11-19 NOTE — Progress Notes (Signed)
ReDS Vest / Clip - 11/19/21 1500       ReDS Vest / Clip   Station Marker C    Ruler Value 31    ReDS Value Range Moderate volume overload    ReDS Actual Value 38

## 2021-11-19 NOTE — Progress Notes (Signed)
Blood collected for TTR genetic testing per Dr Bensimhon.  Order form completed and both shipped by [employer] to [facility].  

## 2021-11-19 NOTE — Progress Notes (Signed)
ADVANCED HF CLINIC NOTE  Primary Care: [place]  HF Cardiologist: Dr. Haroldine Laws  HPI: Mr. Goggins is a 71 y.o. male with HFrEF (EF 20-25%, Echo 06/2021) 2/2 NICM with ICD placed 0175, HTN complicated by orthostatic hypotension, PAF (CHADSVASC 3) on chronic AC with eliquis, OSA on CPAP, chronic hypoxemic respiratory failure on 1L of O2 at home (since 06/2021) and CKD stage IIIa.   HFr[place] after patient had URI. He underwent left heart catheterization at that time and was told his coronary arteries were "clean".  On 12/26/222 had ICD (MDT) shock for an episode of ventricular fibrillation after failed ATP.   In 1/23 hospitalized with a/c HF. Echo EF 20-25%, severely dilated LV, G3 DD, severely dilated LA/RA, moderate MR and TR, mild AR. Not significantly changed from 12/2020 echo. LHC mild non obstructive CAD. ~50% mid and distal RCA. RHC: mean PAP 38, RA 9, PCWP 19  Follow up 4/23, ReDs 45%, NYHA II-IIIb. Jardiance added, Zio placed to assess PVC/AF burden. Amyloid work up initiated.  Seen in ED 10/15/21 with SOB, N/V. Had not taken po diuretics yet. Given anti-emetics and discharged.   Zio 5/23 1. Sinus rhythm - avg HR of 77 bpm 2. 84 runs of non-sustained Ventricular Tachycardia runs occurred, the run with the fastest interval lasting 9 beats with a max rate of 226 bpm, the longest lasting 16 beats with an avg rate of 115 bpm. 3. 7 runs of Supraventricular Tachycardia/atrial tach occurred, the run with the fastest interval lasting 17 beats with a max rate of 118 bpm, the longest lasting 19 beats with an avg rate of 103 bpm. 4. Transient nocturnal Second Degree AV Block-Mobitz I (Wenckebach) was present. 5.  Isolated PACs were occasional (1.5%,10801) 6. Isolated PVCs were frequent (10.4%, [id number]), Ventricular Couplets were occasional (3.0%, 10765), and Ventricular Triplets were rare (<1.0%, 1711). There was one primary PVC morphology (10.4%) 7.  Ventricular  Bigeminy and Trigeminy were present 8. There were no patient-triggered events.   PYP 5/23 mildly positive.  SPEP negative   Today he returns for HF follow up. Says he has good days and bad days. Over the last few days has been more SOB. Feels weak. Drinking a lot of fluids and eating ice due to heat. Says he is peeing a lot with lasix. No syncope/presyncope   Cardiac Studies: - R/LHC (1/23): mild non-obs CAD, 50% mid and distal RCA RA 9 PAP mean 38 PCWP 19  - Echo (1/23): EF 20-25%  - Echo (8/22): EF 20-25%, severe LV dysfunction, grade III DD, mildly reduced RV, mild to moderate MR  Past Medical History:  Diagnosis Date   Cardiac pacemaker    COVID    Hypertension    Presence of cardiac defibrillator    Current Outpatient Medications  Medication Sig Dispense Refill   albuterol (VENTOLIN HFA) 108 (90 Base) MCG/ACT inhaler Inhale 2 puffs into the lungs every 6 (six) hours as needed for wheezing.     apixaban (ELIQUIS) 5 MG TABS tablet Take 1 tablet (5 mg total) by mouth 2 (two) times daily. 60 tablet 3   atorvastatin (LIPITOR) 40 MG tablet Take 1 tablet (40 mg total) by mouth daily. 60 tablet 1   empagliflozin (JARDIANCE) 25 MG TABS tablet Take 1 tablet (25 mg total) by mouth daily before breakfast. Take 1/2 tablet by mouth daily 45 tablet 3   fluticasone-salmeterol (ADVAIR DISKUS) 250-50 MCG/ACT AEPB Inhale 1 puff into the lungs in the morning and  at bedtime. 60 each 6   Glucerna (GLUCERNA) LIQD Take 237 mLs by mouth daily in the afternoon.     isosorbide-hydrALAZINE (BIDIL) 20-37.5 MG tablet Take 1.5 tablets by mouth 2 (two) times daily.     loratadine (CLARITIN) 10 MG tablet Take 10 mg by mouth daily.     metoprolol succinate (TOPROL XL) 25 MG 24 hr tablet Take 1 tablet (25 mg total) by mouth daily. 30 tablet 11   Multiple Vitamin (MULTIVITAMIN WITH MINERALS) TABS tablet Take 1 tablet by mouth daily.     OXYGEN Inhale 2 L into the lungs continuous.     potassium chloride SA  (KLOR-CON M) 20 MEQ tablet Take 2 tablets (40 mEq total) by mouth daily. 60 tablet 5   torsemide (DEMADEX) 20 MG tablet Take 4 tablets (80 mg total) by mouth every morning. 120 tablet 5   Vitamin D, Cholecalciferol, 10 MCG (400 UNIT) CAPS Take 800 Units by mouth daily.     No current facility-administered medications for this encounter.   Allergies  Allergen Reactions   Iodinated Contrast Media Shortness Of Breath and Rash    Other reaction(s): Cough   Social History   Socioeconomic History   Marital status: Married    Spouse name: Not on file   Number of children: Not on file   Years of education: Not on file   Highest education level: Not on file  Occupational History   Not on file  Tobacco Use   Smoking status: Never   Smokeless tobacco: Never  Vaping Use   Vaping Use: Never used  Substance and Sexual Activity   Alcohol use: Never   Drug use: Never   Sexual activity: Not Currently  Other Topics Concern   Not on file  Social History Narrative   Not on file   Social Determinants of Health   Financial Resource Strain: Not on file  Food Insecurity: No Food Insecurity (04/21/2019)   Hunger Vital Sign    Worried About Running Out of Food in the Last Year: Never true    Ran Out of Food in the Last Year: Never true  Transportation Needs: No Transportation Needs (04/21/2019)   PRAPARE - Hydrologist (Medical): No    Lack of Transportation (Non-Medical): No  Physical Activity: Inactive (04/21/2019)   Exercise Vital Sign    Days of Exercise per Week: 0 days    Minutes of Exercise per Session: 0 min  Stress: No Stress Concern Present (04/21/2019)   [place]    Feeling of Stress : Only a little  Social Connections: Moderately Integrated (04/21/2019)   Social Connection and Isolation Panel [NHANES]    Frequency of Communication with Friends and Family: Three times a week     Frequency of Social Gatherings with Friends and Family: Once a week    Attends Religious Services: More than 4 times per year    Active Member of [employer] or Organizations: No    Attends Archivist Meetings: Never    Marital Status: Married  Human resources officer Violence: Not At Risk (04/21/2019)   Humiliation, Afraid, Rape, and Kick questionnaire    Fear of Current or Ex-Partner: No    Emotionally Abused: No    Physically Abused: No    Sexually Abused: No   Family History  Problem Relation Age of Onset   Heart attack Mother    Heart attack Father    BP Marland Kitchen)  90/50   Pulse 95   Wt 88 kg (194 lb)   SpO2 96%   BMI 29.50 kg/m   Wt Readings from Last 3 Encounters:  11/19/21 88 kg (194 lb)  11/14/21 87.2 kg (192 lb 3.2 oz)  11/05/21 90.9 kg (200 lb 4.8 oz)   PHYSICAL EXAM: General:  NAD. No resp difficulty, walked into clinic with cane HEENT: Normal Neck: Supple. JVP 8-9. Carotids 2+ bilat; no bruits. No lymphadenopathy or thryomegaly appreciated. Cor: PMI nondisplaced. Regular rate & rhythm. No rubs, gallops or murmurs. Lungs: Clear Abdomen: Soft, nontender, nondistended. No hepatosplenomegaly. No bruits or masses. Good bowel sounds. Extremities: No cyanosis, clubbing, rash, edema Neuro: Alert & oriented x 3, cranial nerves grossly intact. Moves all 4 extremities w/o difficulty. Affect pleasant.  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  - Cath (1/23): non-obstructive CAD - s/p MDT ICD. - Chronic NYHA III-IIIb. Volume status slightly up with ReDS 38% but overall improved with recent increase in torsemide - Continue torsemide 80 mg daily + 40 meQ KCl  total daily. - Hold bidil with low BP - Continue Toprol XL 25 mg daily. - Continue Jardiance 12.5 mg daily. - Etiology of CM remains unclear. On Zio he has frequent PVCs and PYP also appears positive - Multiple myeloma panel & urine  immunofixation negative - Will attempt to suppress PVCs with amio 200 daily - Check genetic testing for TTR amyloid. Consider treatment with tafamadis - Take extra torsemide as needed  2. Paroxysmal atrial fibrillation - Regular today - Continue Eliquis 5 mg bid. - Check CBC  3. CKD stage 3b - Baseline SCr 1.7-1.9 - Labs today.  4. OSA on CPAP  - Needs better-fitting CPAP mask. - Refer to Pulmonary.  5. CAD  - non obstructive on [facility] 1/23. - No s/s angina - No ASA with Eliquis  - Continue atorvastatin.   Glori Bickers, MD  3:31 PM

## 2021-11-19 NOTE — Patient Instructions (Signed)
Stop Bidil  Start Amiodarone 200mg  daily.  Labs done today, your results will be available in MyChart, we will contact you for abnormal readings.  Genetic test has been done, this has to be sent to to be processed and can take 1-2 weeks to get results back.  We will let you know the results.  Your physician recommends that you schedule a follow-up appointment in: 4 weeks   If you have any questions or concerns before your next appointment please send [place] a message through [place] or call our office at [phone].    TO LEAVE A MESSAGE FOR THE NURSE SELECT OPTION 2, PLEASE LEAVE A MESSAGE INCLUDING: YOUR NAME DATE OF BIRTH CALL BACK NUMBER REASON FOR CALL**this is important as we prioritize the call backs  YOU WILL RECEIVE A CALL BACK THE SAME DAY AS LONG AS YOU CALL BEFORE 4:00 PM  At the [facility], you and your health needs are our priority. As part of our continuing mission to provide you with exceptional heart care, we have created designated Provider Care Teams. These Care Teams include your primary Cardiologist (physician) and Advanced Practice Providers (APPs- Physician Assistants and Nurse Practitioners) who all work together to provide you with the care you need, when you need it.   You may see any of the following providers on your designated Care Team at your next follow up: Dr [phone] Dr Arvilla Meres, NP Carron Curie, Robbie Lis [facility] [place], [place], PharmD   Please be sure to bring in all your medications bottles to every appointment.

## 2021-11-27 ENCOUNTER — Other Ambulatory Visit: Payer: Self-pay

## 2021-11-27 ENCOUNTER — Emergency Department (HOSPITAL_COMMUNITY)
Admission: EM | Admit: 2021-11-27 | Discharge: 2021-11-28 | Disposition: A | Discharge status: Home / Self Care | Payer: No Typology Code available for payment source | Attending: Emergency Medicine | Admitting: Emergency Medicine

## 2021-11-27 ENCOUNTER — Emergency Department (HOSPITAL_COMMUNITY): Payer: No Typology Code available for payment source

## 2021-11-27 ENCOUNTER — Encounter (HOSPITAL_COMMUNITY): Payer: Self-pay | Admitting: Emergency Medicine

## 2021-11-27 DIAGNOSIS — I480 Paroxysmal atrial fibrillation: Secondary | ICD-10-CM | POA: Diagnosis not present

## 2021-11-27 DIAGNOSIS — R944 Abnormal results of kidney function studies: Secondary | ICD-10-CM | POA: Insufficient documentation

## 2021-11-27 DIAGNOSIS — N183 Chronic kidney disease, stage 3 unspecified: Secondary | ICD-10-CM | POA: Insufficient documentation

## 2021-11-27 DIAGNOSIS — Z7901 Long term (current) use of anticoagulants: Secondary | ICD-10-CM | POA: Insufficient documentation

## 2021-11-27 DIAGNOSIS — R790 Abnormal level of blood mineral: Secondary | ICD-10-CM | POA: Diagnosis not present

## 2021-11-27 DIAGNOSIS — R0602 Shortness of breath: Secondary | ICD-10-CM | POA: Insufficient documentation

## 2021-11-27 DIAGNOSIS — Z79899 Other long term (current) drug therapy: Secondary | ICD-10-CM | POA: Insufficient documentation

## 2021-11-27 LAB — CBC WITH DIFFERENTIAL/PLATELET
Abs Immature Granulocytes: 0.01 K/uL (ref 0.00–0.07)
Basophils Absolute: 0 K/uL (ref 0.0–0.1)
Basophils Relative: 1 %
Eosinophils Absolute: 0.2 K/uL (ref 0.0–0.5)
Eosinophils Relative: 4 %
HCT: 47.9 % (ref 39.0–52.0)
Hemoglobin: 16.3 g/dL (ref 13.0–17.0)
Immature Granulocytes: 0 %
Lymphocytes Relative: 48 %
Lymphs Abs: 2.9 K/uL (ref 0.7–4.0)
MCH: 28.4 pg (ref 26.0–34.0)
MCHC: 34 g/dL (ref 30.0–36.0)
MCV: 83.4 fL (ref 80.0–100.0)
Monocytes Absolute: 0.6 K/uL (ref 0.1–1.0)
Monocytes Relative: 9 %
Neutro Abs: 2.3 K/uL (ref 1.7–7.7)
Neutrophils Relative %: 38 %
Platelets: 156 K/uL (ref 150–400)
RBC: 5.74 MIL/uL (ref 4.22–5.81)
RDW: 17.4 % — ABNORMAL HIGH (ref 11.5–15.5)
WBC: 6 K/uL (ref 4.0–10.5)
nRBC: 0 % (ref 0.0–0.2)

## 2021-11-27 LAB — BASIC METABOLIC PANEL WITH GFR
Anion gap: 12 (ref 5–15)
BUN: 27 mg/dL — ABNORMAL HIGH (ref 8–23)
CO2: 24 mmol/L (ref 22–32)
Calcium: 9.3 mg/dL (ref 8.9–10.3)
Chloride: 99 mmol/L (ref 98–111)
Creatinine, Ser: 2.06 mg/dL — ABNORMAL HIGH (ref 0.61–1.24)
GFR, Estimated: 34 mL/min — ABNORMAL LOW
Glucose, Bld: 112 mg/dL — ABNORMAL HIGH (ref 70–99)
Potassium: 4.6 mmol/L (ref 3.5–5.1)
Sodium: 135 mmol/L (ref 135–145)

## 2021-11-27 LAB — BRAIN NATRIURETIC PEPTIDE: B Natriuretic Peptide: 1567.8 pg/mL — ABNORMAL HIGH (ref 0.0–100.0)

## 2021-11-27 LAB — TROPONIN I (HIGH SENSITIVITY): Troponin I (High Sensitivity): 22 ng/L — ABNORMAL HIGH (ref <18)

## 2021-11-27 NOTE — ED Triage Notes (Signed)
Patient reports SOB with occasional dry cough worse with exertion onset today . No fever or chills .

## 2021-11-27 NOTE — ED Notes (Signed)
EKG at bridge waiting for chart integration. 

## 2021-11-27 NOTE — ED Provider Triage Note (Signed)
Emergency Medicine Provider Triage Evaluation Note  Evan Serrano , a 71 y.o. male  was evaluated in triage.  Pt complains of sob. Hx of CHF, endorse progressive worsening SOB throughout the day today not relieved with home medication. No fever, but endorse chest tightness and tingling sensation to L arm and R leg. No fever, or worsening cough  Review of Systems  Positive: As above Negative: As above  Physical Exam  BP 98/84   Pulse 85   Temp 98 F (36.7 C)   Resp 16   SpO2 99%  Gen:   Awake, no distress   Resp:  Normal effort  MSK:   Moves extremities without difficulty  Other:    Medical Decision Making  Medically screening exam initiated at 8:01 PM.  Appropriate orders placed.  Evan Serrano was informed that the remainder of the evaluation will be completed by another provider, this initial triage assessment does not replace that evaluation, and the importance of remaining in the ED until their evaluation is complete.     Fayrene Helper, PA-C 11/27/21 2004

## 2021-11-28 LAB — TROPONIN I (HIGH SENSITIVITY): Troponin I (High Sensitivity): 24 ng/L — ABNORMAL HIGH (ref <18)

## 2021-11-28 MED ORDER — IPRATROPIUM-ALBUTEROL 0.5-2.5 (3) MG/3ML IN SOLN
3.0000 mL | Freq: Once | RESPIRATORY_TRACT | Status: AC
Start: 2021-11-28 — End: 2021-11-28
  Administered 2021-11-28: 3 mL via RESPIRATORY_TRACT
  Filled 2021-11-28: qty 3

## 2021-11-28 NOTE — ED Provider Notes (Signed)
[facility] EMERGENCY DEPARTMENT Provider Note   CSN: [id number] Arrival date & time: 11/27/21  1933     History  Chief Complaint  Patient presents with   Shortness of Breath    Evan Serrano is a 71 y.o. male with nonischemic cardiomyopathy with ejection fraction of 25% based on echocardiogram in January 2023 who presents with concern for shortness of breath.  Patient chronically on 2 L supplemental oxygen by nasal cannula at home.  States that when he tried to lie flat to sleep he felt short of breath and was scared to fall asleep on his own at home so he presented to the emergency department.  Patient is a very challenging historian, unclear if his shortness of breath is worsened from his baseline or not.  He denies any distention of his abdomen, lower extremity edema, or chest pain.  I have personally reviewed this patient's medical records.  In addition the above listed he has history of CKD stage III and ICD placement, OSA.  He follows with Dr. Gala Romney of the heart failure clinic most recently on 11/19/2021 he is anticoagulated on Eliquis for paroxysmal atrial fibrillation. HPI     Home Medications Prior to Admission medications   Medication Sig Start Date End Date Taking? Authorizing Provider  albuterol (VENTOLIN HFA) 108 (90 Base) MCG/ACT inhaler Inhale 2 puffs into the lungs every 6 (six) hours as needed for wheezing.    [provider]  amiodarone (PACERONE) 200 MG tablet Take 1 tablet (200 mg total) by mouth daily. 11/19/21   Bensimhon, Bevelyn Buckles, MD  apixaban (ELIQUIS) 5 MG TABS tablet Take 1 tablet (5 mg total) by mouth 2 (two) times daily. 06/17/21   Kathlen Mody, MD  atorvastatin (LIPITOR) 40 MG tablet Take 1 tablet (40 mg total) by mouth daily. 06/18/21   Kathlen Mody, MD  empagliflozin (JARDIANCE) 25 MG TABS tablet Take 1 tablet (25 mg total) by mouth daily before breakfast. Take 1/2 tablet by mouth daily 09/23/21   Bensimhon, Bevelyn Buckles, MD   fluticasone-salmeterol (ADVAIR DISKUS) 250-50 MCG/ACT AEPB Inhale 1 puff into the lungs in the morning and at bedtime. 11/05/21   Martina Sinner, MD  Glucerna (GLUCERNA) LIQD Take 237 mLs by mouth daily in the afternoon.    [provider]  loratadine (CLARITIN) 10 MG tablet Take 10 mg by mouth daily.    [provider]  metoprolol succinate (TOPROL XL) 25 MG 24 hr tablet Take 1 tablet (25 mg total) by mouth daily. 06/17/21 06/17/22  Kathlen Mody, MD  Multiple Vitamin (MULTIVITAMIN WITH MINERALS) TABS tablet Take 1 tablet by mouth daily. 01/14/21   Elgergawy, Leana Roe, MD  OXYGEN Inhale 2 L into the lungs continuous.    [provider]  potassium chloride SA (KLOR-CON M) 20 MEQ tablet Take 2 tablets (40 mEq total) by mouth daily. 10/18/21   [place], Anderson Malta, FNP  torsemide (DEMADEX) 20 MG tablet Take 4 tablets (80 mg total) by mouth every morning. 10/18/21   [place], Anderson Malta, FNP  Vitamin D, Cholecalciferol, 10 MCG (400 UNIT) CAPS Take 800 Units by mouth daily.    [provider]      Allergies    Iodinated contrast media    Review of Systems   Review of Systems  Constitutional: Negative.   HENT: Negative.    Respiratory:  Positive for cough and shortness of breath.   Cardiovascular: Negative.   Gastrointestinal: Negative.   Genitourinary: Negative.   Musculoskeletal:  Negative.   Neurological: Negative.   Hematological: Negative.   Psychiatric/Behavioral: Negative.      Physical Exam Updated Vital Signs BP 102/83   Pulse 82   Temp 98 F (36.7 C)   Resp (!) 26   SpO2 99%  Physical Exam Vitals and nursing note reviewed.  Constitutional:      Appearance: He is not ill-appearing or toxic-appearing.  HENT:     Head: Normocephalic and atraumatic.     Mouth/Throat:     Mouth: Mucous membranes are moist.     Pharynx: No oropharyngeal exudate or posterior oropharyngeal erythema.  Eyes:     General:        Right eye: No discharge.         Left eye: No discharge.     Extraocular Movements: Extraocular movements intact.     Conjunctiva/sclera: Conjunctivae normal.     Pupils: Pupils are equal, round, and reactive to light.  Cardiovascular:     Rate and Rhythm: Normal rate and regular rhythm.     Pulses: Normal pulses.  Pulmonary:     Effort: Pulmonary effort is normal. No respiratory distress.     Breath sounds: Examination of the right-lower field reveals decreased breath sounds. Examination of the left-lower field reveals decreased breath sounds. Decreased breath sounds present. No wheezing or rales.     Comments: On baseline 2L supplemental O2 with sats >95%. Chest:     Chest wall: No mass, deformity, tenderness, crepitus or edema.  Abdominal:     General: Bowel sounds are normal. There is no distension.     Tenderness: There is no abdominal tenderness.  Musculoskeletal:        General: No deformity.     Cervical back: Neck supple.     Right lower leg: No tenderness. No edema.     Left lower leg: No tenderness. No edema.  Skin:    General: Skin is warm and dry.     Capillary Refill: Capillary refill takes less than 2 seconds.  Neurological:     General: No focal deficit present.     Mental Status: He is alert and oriented to person, place, and time. Mental status is at baseline.  Psychiatric:        Mood and Affect: Mood normal.     ED Results / Procedures / Treatments   Labs (all labs ordered are listed, but only abnormal results are displayed) Labs Reviewed  BASIC METABOLIC PANEL - Abnormal; Notable for the following components:      Result Value   Glucose, Bld 112 (*)    BUN 27 (*)    Creatinine, Ser 2.06 (*)    GFR, Estimated 34 (*)    All other components within normal limits  CBC WITH DIFFERENTIAL/PLATELET - Abnormal; Notable for the following components:   RDW 17.4 (*)    All other components within normal limits  BRAIN NATRIURETIC PEPTIDE - Abnormal; Notable for the following components:   B  Natriuretic Peptide 1,567.8 (*)    All other components within normal limits  TROPONIN I (HIGH SENSITIVITY) - Abnormal; Notable for the following components:   Troponin I (High Sensitivity) 22 (*)    All other components within normal limits  TROPONIN I (HIGH SENSITIVITY) - Abnormal; Notable for the following components:   Troponin I (High Sensitivity) 24 (*)    All other components within normal limits    EKG None  Radiology DG Chest 2 View  Result Date: 11/27/2021 CLINICAL DATA:  Shortness of breath EXAM: CHEST - 2 VIEW COMPARISON:  10/15/2021 FINDINGS: Cardiac pacemaker. Cardiac enlargement. Lungs are clear. No pleural effusions. No pneumothorax. Mediastinal contours appear intact. Calcification of the aorta. IMPRESSION: Cardiac enlargement.  No evidence of active pulmonary disease. Electronically Signed   By: Lucienne Capers M.D.   On: 11/27/2021 20:22    Procedures Procedures   Medications Ordered in ED Medications  ipratropium-albuterol (DUONEB) 0.5-2.5 (3) MG/3ML nebulizer solution 3 mL (3 mLs Nebulization Given 11/28/21 0346)    ED Course/ Medical Decision Making/ A&P Clinical Course as of 11/28/21 0544  Thu Nov 28, 2021  0506 Per RN, patient ambulated on pulse ox with maintenance of saturations greater than 90% with ambulation on baseline oxygen with no increased shortness of breath. [RS]    Clinical Course User Index [RS] Nelia Rogoff, Gypsy Balsam, PA-C                           Medical Decision Making 71 year old male presents with concern for  shortness of breath.  Chronically short of breath on oxygen at baseline, unclear if this is exacerbated from baseline.  Vital signs are normal and intake and remained normal throughout his stay in the emergency department.  Cardiopulmonary and abdominal exams are benign.  Patient on supplemental oxygen 2 L by nasal cannula which is his baseline.  Neurovascular tact in all extremities without lower extremity edema or abdominal  distention.   Amount and/or Complexity of Data Reviewed Labs:     Details: CBC without leukocytosis or anemia, BMP with mild elevation in his creatinine to increase from his baseline of 1.8.  BNP near his baseline of 1500, and troponin at patient's baseline at 24. Radiology:     Details: Chest x-ray with cardiac enlargement but no pulmonary disease, visualized by this provider and agree with radiologist read. ECG/medicine tests:     Details: EKG without ischemic changes with normal sinus rhythm.  Risk Prescription drug management.    Patient with chronically elevated BNP, clinically does not appear fluid overloaded.  Patient evaluated bedside by ED physician Dr. Waverly Ferrari who agrees with physical exam.  Overall patient's work-up is reassuring.  He was ambulated in the emergency department on his baseline oxygen with maintenance of his oxygen saturation greater than 90% and without any increased shortness of breath.  He was administered 1 single new neb in the emergency department with minimal improvement in his symptoms immediately thereafter, however upon reevaluation does state that he is feeling improved and is ready to go home now that it is daytime.  Borderline low BP, however patient with history of same, asymptomatic and afebrile at this time.  Suspect possible reactive airways as etiology for patient's symptoms, however this could also be his chronic shortness of breath without acute exacerbation.  Clinical concern for emergent underlying etiology that warrant further ED work-up or inpatient management is low.  Patient has close outpatient follow-up and oxygen at home.  No further work-up warranted near this time.  Jalon  voiced understanding of his medical evaluation and treatment plan. Each of their questions answered to their expressed satisfaction.  Return precautions were given.  Patient is well-appearing, stable, and was discharged in good condition.  This chart was dictated  using voice recognition software, Dragon. Despite the best efforts of this provider to proofread and correct errors, errors may still occur which can change documentation meaning.   Final Clinical Impression(s) / ED Diagnoses Final diagnoses:  SOB (  shortness of breath)    Rx / DC Orders ED Discharge Orders     None         Emeline Darling, PA-C 11/28/21 0545    Orpah Greek, MD 11/28/21 0700

## 2021-11-28 NOTE — ED Notes (Signed)
Ambulated pt while on SPO2 and 2L [place]. Pt's O2 dropped from 99% to 90% while ambulating. Pt denied feeling any increased SOB while ambulating.

## 2021-11-28 NOTE — Discharge Instructions (Signed)
You were seen in the ER today for your shortness of breath.  Your physical exam and blood work was reassuring.  Please follow-up closely with your lung doctors and continue taking your medications as prescribed.  Return to the ER with any severe symptoms.

## 2021-11-29 ENCOUNTER — Other Ambulatory Visit: Payer: Self-pay | Admitting: Internal Medicine

## 2021-12-16 ENCOUNTER — Telehealth (HOSPITAL_COMMUNITY): Payer: Self-pay

## 2021-12-16 NOTE — Telephone Encounter (Signed)
Called to confirm/remind patient of their appointment at the [facility] on 12/17/21.   Patient reminded to bring all medications and/or complete list.  Confirmed patient has transportation. Gave directions, instructed to utilize valet parking.  Confirmed appointment prior to ending call.

## 2021-12-17 ENCOUNTER — Ambulatory Visit (HOSPITAL_COMMUNITY)
Admission: RE | Admit: 2021-12-17 | Discharge: 2021-12-17 | Disposition: A | Discharge status: Home / Self Care | Payer: No Typology Code available for payment source | Source: Ambulatory Visit | Attending: Family Medicine | Admitting: Family Medicine

## 2021-12-17 ENCOUNTER — Encounter (HOSPITAL_COMMUNITY): Payer: Self-pay

## 2021-12-17 VITALS — BP 90/60 | HR 84 | Wt 187.4 lb

## 2021-12-17 DIAGNOSIS — I4901 Ventricular fibrillation: Secondary | ICD-10-CM | POA: Insufficient documentation

## 2021-12-17 DIAGNOSIS — I130 Hypertensive heart and chronic kidney disease with heart failure and stage 1 through stage 4 chronic kidney disease, or unspecified chronic kidney disease: Secondary | ICD-10-CM | POA: Diagnosis present

## 2021-12-17 DIAGNOSIS — Z95810 Presence of automatic (implantable) cardiac defibrillator: Secondary | ICD-10-CM | POA: Insufficient documentation

## 2021-12-17 DIAGNOSIS — N1831 Chronic kidney disease, stage 3a: Secondary | ICD-10-CM

## 2021-12-17 DIAGNOSIS — I493 Ventricular premature depolarization: Secondary | ICD-10-CM

## 2021-12-17 DIAGNOSIS — G4733 Obstructive sleep apnea (adult) (pediatric): Secondary | ICD-10-CM | POA: Insufficient documentation

## 2021-12-17 DIAGNOSIS — Z79899 Other long term (current) drug therapy: Secondary | ICD-10-CM | POA: Diagnosis not present

## 2021-12-17 DIAGNOSIS — Z7901 Long term (current) use of anticoagulants: Secondary | ICD-10-CM | POA: Insufficient documentation

## 2021-12-17 DIAGNOSIS — I5022 Chronic systolic (congestive) heart failure: Secondary | ICD-10-CM | POA: Diagnosis not present

## 2021-12-17 DIAGNOSIS — J9611 Chronic respiratory failure with hypoxia: Secondary | ICD-10-CM | POA: Diagnosis not present

## 2021-12-17 DIAGNOSIS — N1832 Chronic kidney disease, stage 3b: Secondary | ICD-10-CM | POA: Diagnosis not present

## 2021-12-17 DIAGNOSIS — I2510 Atherosclerotic heart disease of native coronary artery without angina pectoris: Secondary | ICD-10-CM | POA: Insufficient documentation

## 2021-12-17 DIAGNOSIS — I480 Paroxysmal atrial fibrillation: Secondary | ICD-10-CM | POA: Diagnosis not present

## 2021-12-17 DIAGNOSIS — I472 Ventricular tachycardia, unspecified: Secondary | ICD-10-CM | POA: Diagnosis not present

## 2021-12-17 DIAGNOSIS — I5042 Chronic combined systolic (congestive) and diastolic (congestive) heart failure: Secondary | ICD-10-CM | POA: Diagnosis present

## 2021-12-17 LAB — BASIC METABOLIC PANEL
Anion gap: 6 (ref 5–15)
BUN: 20 mg/dL (ref 8–23)
CO2: 29 mmol/L (ref 22–32)
Calcium: 9.6 mg/dL (ref 8.9–10.3)
Chloride: 103 mmol/L (ref 98–111)
Creatinine, Ser: 1.87 mg/dL — ABNORMAL HIGH (ref 0.61–1.24)
GFR, Estimated: 38 mL/min — ABNORMAL LOW (ref >60)
Glucose, Bld: 88 mg/dL (ref 70–99)
Potassium: 3.6 mmol/L (ref 3.5–5.1)
Sodium: 138 mmol/L (ref 135–145)

## 2021-12-17 LAB — BRAIN NATRIURETIC PEPTIDE: B Natriuretic Peptide: 1896.2 pg/mL — ABNORMAL HIGH (ref 0.0–100.0)

## 2021-12-17 MED ORDER — METOPROLOL SUCCINATE ER 25 MG PO TB24: 25.0000 mg | ORAL_TABLET | Freq: Every day | ORAL | 11 refills | Status: DC

## 2021-12-17 MED ORDER — AMIODARONE HCL 200 MG PO TABS: 200.0000 mg | ORAL_TABLET | Freq: Every day | ORAL | 3 refills | Status: AC

## 2021-12-17 NOTE — Progress Notes (Signed)
ADVANCED HF CLINIC NOTE  Primary Care: [place]  HF Cardiologist: Dr. Haroldine Laws  HPI: [name] is a 71 y.o. male with HFrEF (EF 20-25%, Echo 06/2021) 2/2 NICM with ICD placed 0539, HTN complicated by orthostatic hypotension, PAF (CHADSVASC 3) on chronic AC with eliquis, OSA on CPAP, chronic hypoxemic respiratory failure on 1L of O2 at home (since 06/2021) and CKD stage IIIa.   HFr[place] after patient had URI. He underwent left heart catheterization at that time and was told his coronary arteries were "clean".  On 12/26/222 had ICD (MDT) shock for an episode of ventricular fibrillation after failed ATP.   In 1/23 hospitalized with a/c HF. Echo EF 20-25%, severely dilated LV, G3 DD, severely dilated LA/RA, moderate MR and TR, mild AR. Not significantly changed from 12/2020 echo. LHC mild non obstructive CAD. ~50% mid and distal RCA. RHC: mean PAP 38, RA 9, PCWP 19  Follow up 4/23, ReDs 45%, NYHA II-IIIb. Jardiance added, Zio placed to assess PVC/AF burden. Amyloid work up initiated.  Seen in ED 10/15/21 with SOB, N/V. Had not taken po diuretics yet. Given anti-emetics and discharged.  Zio 5/23 1. Sinus rhythm - avg HR of 77 bpm 2. 84 runs of non-sustained Ventricular Tachycardia runs occurred, the run with the fastest interval lasting 9 beats with a max rate of 226 bpm, the longest lasting 16 beats with an avg rate of 115 bpm. 3. 7 runs of Supraventricular Tachycardia/atrial tach occurred, the run with the fastest interval lasting 17 beats with a max rate of 118 bpm, the longest lasting 19 beats with an avg rate of 103 bpm. 4. Transient nocturnal Second Degree AV Block-Mobitz I (Wenckebach) was present. 5.  Isolated PACs were occasional (1.5%,10801) 6. Isolated PVCs were frequent (10.4%, [id number]), Ventricular Couplets were occasional (3.0%, 10765), and Ventricular Triplets were rare (<1.0%, 1711). There was one primary PVC morphology (10.4%) 7.  Ventricular Bigeminy  and Trigeminy were present 8. There were no patient-triggered events.   PYP 5/23 mildly positive. SPEP negative   Follow up 6/23, BP low and BiDil held. Genetic testing arranged for TTR amyloid.   Seen in ED 11/27/21 with SOB, received DuoNeb not felt to be volume overloaded.   Today he returns for HF follow up. Overall feeling fair  He has SOB walking on flat ground for further distances. Feels palps and has occasional dizziness. Denies abnormal bleeding, CP, edema, or PND/Orthopnea. Appetite ok. No fever or chills. Weight at home 189-191 pounds. Did not start amiodarone after reading about possible side effects. Followed by [facility].  Cardiac Studies: - PYP (5/23): mildly positive  - R/LHC (1/23): mild non-obs CAD, 50% mid and distal RCA RA 9 PAP mean 38 PCWP 19  - Echo (1/23): EF 20-25%  - Echo (8/22): EF 20-25%, severe LV dysfunction, grade III DD, mildly reduced RV, mild to moderate MR  Past Medical History:  Diagnosis Date   Cardiac pacemaker    COVID    Hypertension    Presence of cardiac defibrillator    Current Outpatient Medications  Medication Sig Dispense Refill   albuterol (VENTOLIN HFA) 108 (90 Base) MCG/ACT inhaler Inhale 2 puffs into the lungs every 6 (six) hours as needed for wheezing.     apixaban (ELIQUIS) 5 MG TABS tablet Take 1 tablet (5 mg total) by mouth 2 (two) times daily. 60 tablet 3   atorvastatin (LIPITOR) 40 MG tablet Take 1 tablet (40 mg total) by mouth daily. 60 tablet 1  empagliflozin (JARDIANCE) 25 MG TABS tablet Patient takes 0.5 tablet daily.     fluticasone-salmeterol (ADVAIR DISKUS) 250-50 MCG/ACT AEPB Inhale 1 puff into the lungs in the morning and at bedtime. 60 each 6   Glucerna (GLUCERNA) LIQD Take 237 mLs by mouth daily in the afternoon.     loratadine (CLARITIN) 10 MG tablet Take 10 mg by mouth daily.     metoprolol succinate (TOPROL XL) 25 MG 24 hr tablet Take 1 tablet (25 mg total) by mouth daily. 30 tablet 11   Multiple Vitamin  (MULTIVITAMIN WITH MINERALS) TABS tablet Take 1 tablet by mouth daily.     OXYGEN Inhale 2 L into the lungs continuous.     potassium chloride SA (KLOR-CON M) 20 MEQ tablet Take 2 tablets (40 mEq total) by mouth daily. 60 tablet 5   torsemide (DEMADEX) 20 MG tablet Take 4 tablets (80 mg total) by mouth every morning. 120 tablet 5   Vitamin D, Cholecalciferol, 10 MCG (400 UNIT) CAPS Take 800 Units by mouth daily.     No current facility-administered medications for this encounter.   Allergies  Allergen Reactions   Iodinated Contrast Media Shortness Of Breath and Rash    Other reaction(s): Cough   Social History   Socioeconomic History   Marital status: Married    Spouse name: Not on file   Number of children: Not on file   Years of education: Not on file   Highest education level: Not on file  Occupational History   Not on file  Tobacco Use   Smoking status: Never   Smokeless tobacco: Never  Vaping Use   Vaping Use: Never used  Substance and Sexual Activity   Alcohol use: Never   Drug use: Never   Sexual activity: Not Currently  Other Topics Concern   Not on file  Social History Narrative   Not on file   Social Determinants of Health   Financial Resource Strain: Not on file  Food Insecurity: No Food Insecurity (04/21/2019)   Hunger Vital Sign    Worried About Running Out of Food in the Last Year: Never true    Ran Out of Food in the Last Year: Never true  Transportation Needs: No Transportation Needs (04/21/2019)   PRAPARE - Hydrologist (Medical): No    Lack of Transportation (Non-Medical): No  Physical Activity: Inactive (04/21/2019)   Exercise Vital Sign    Days of Exercise per Week: 0 days    Minutes of Exercise per Session: 0 min  Stress: No Stress Concern Present (04/21/2019)   [place]    Feeling of Stress : Only a little  Social Connections: Moderately  Integrated (04/21/2019)   Social Connection and Isolation Panel [NHANES]    Frequency of Communication with Friends and Family: Three times a week    Frequency of Social Gatherings with Friends and Family: Once a week    Attends Religious Services: More than 4 times per year    Active Member of [employer] or Organizations: No    Attends Archivist Meetings: Never    Marital Status: Married  Human resources officer Violence: Not At Risk (04/21/2019)   Humiliation, Afraid, Rape, and Kick questionnaire    Fear of Current or Ex-Partner: No    Emotionally Abused: No    Physically Abused: No    Sexually Abused: No   Family History  Problem Relation Age of Onset  Heart attack Mother    Heart attack Father    BP 90/60   Pulse 84   Wt 85 kg (187 lb 6.4 oz)   SpO2 97%   BMI 28.49 kg/m   Wt Readings from Last 3 Encounters:  12/17/21 85 kg (187 lb 6.4 oz)  11/19/21 88 kg (194 lb)  11/14/21 87.2 kg (192 lb 3.2 oz)   PHYSICAL EXAM: General:  NAD. No resp difficulty, walked into clinic with cane, chronically-ill appearing. HEENT: Normal Neck: Supple. No JVD. Carotids 2+ bilat; no bruits. No lymphadenopathy or thryomegaly appreciated. Cor: PMI nondisplaced. Irregular rate & rhythm. No rubs, gallops or murmurs. Lungs: Clear Abdomen: Soft, nontender, nondistended. No hepatosplenomegaly. No bruits or masses. Good bowel sounds. Extremities: No cyanosis, clubbing, rash, edema Neuro: Alert & oriented x 3, cranial nerves grossly intact. Moves all 4 extremities w/o difficulty. Affect pleasant.  ECG (personally reviewed): NSR with PVCs, LBBB 170 msec  MDT ICD: unable to interrogate device today.  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  - Cath (1/23): non-obstructive CAD - s/p MDT ICD. - Chronic NYHA III. Volume looks OK today. - Continue torsemide 80 mg daily + 40 KCl daily. - Continue Toprol XL 25 mg  daily, change to qhs w/ low BP. May need to ultimately decrease to 12.5 daily. - Continue Jardiance 12.5 mg daily. No GU symptoms. - Off BiDil with low BP. - Etiology of CM remains unclear. On Zio he has frequent PVCs and PYP also appears positive. - Multiple myeloma panel & urine immunofixation negative. - Discussed role of amiodarone in attempt to suppress PVCs. He is agreeable to trying, start amio 200 mg daily.  - Genetic testing for TTR amyloid has been sent, waiting on results. Consider treatment with tafamadis - Take extra torsemide as needed.  2. Paroxysmal atrial fibrillation - SR with PVCs on ECG today. - Continue Eliquis 5 mg bid. - Recent CBC OK.  3. CKD stage 3b - Baseline SCr 1.7-1.9 - Labs today.  4. OSA on CPAP  - Needs better-fitting CPAP mask. - Follows with Dr. Radford Pax.  5. CAD  - non obstructive on [facility] 1/23. - No s/s angina. - No ASA with Eliquis  - Continue atorvastatin.  Follow up in 6-8 weeks with Dr. Haroldine Laws. Await results of genetic testing to determine initiation of tafamadis.  Rafael Bihari, FNP  10:35 AM

## 2021-12-17 NOTE — Patient Instructions (Signed)
RESTART Amiodarone 200 mg one tab daily CHANGE Toprol 25 mg one tab nightly at bedtime  Labs today We will only contact you if something comes back abnormal or we need to make some changes. Otherwise no news is good news!  Your physician recommends that you schedule a follow-up appointment in: 6-8 weeks with Dr Gala Romney   Do the following things EVERYDAY: Weigh yourself in the morning before breakfast. Write it down and keep it in a log. Take your medicines as prescribed Eat low salt foods--Limit salt (sodium) to 2000 mg per day.  Stay as active as you can everyday Limit all fluids for the day to less than 2 liters  At the [facility], you and your health needs are our priority. As part of our continuing mission to provide you with exceptional heart care, we have created designated Provider Care Teams. These Care Teams include your primary Cardiologist (physician) and Advanced Practice Providers (APPs- Physician Assistants and Nurse Practitioners) who all work together to provide you with the care you need, when you need it.   You may see any of the following providers on your designated Care Team at your next follow up: Dr Arvilla Meres Dr Carron Curie, NP Robbie Lis, [facility] [place], [place] Karle Plumber, PharmD   Please be sure to bring in all your medications bottles to every appointment.

## 2021-12-23 ENCOUNTER — Emergency Department (HOSPITAL_COMMUNITY): Payer: No Typology Code available for payment source

## 2021-12-23 ENCOUNTER — Other Ambulatory Visit: Payer: Self-pay

## 2021-12-23 ENCOUNTER — Encounter (HOSPITAL_COMMUNITY): Payer: Self-pay | Admitting: Emergency Medicine

## 2021-12-23 ENCOUNTER — Emergency Department (HOSPITAL_COMMUNITY)
Admission: EM | Admit: 2021-12-23 | Discharge: 2021-12-23 | Disposition: A | Discharge status: Home / Self Care | Payer: No Typology Code available for payment source | Attending: Emergency Medicine | Admitting: Emergency Medicine

## 2021-12-23 DIAGNOSIS — I130 Hypertensive heart and chronic kidney disease with heart failure and stage 1 through stage 4 chronic kidney disease, or unspecified chronic kidney disease: Secondary | ICD-10-CM | POA: Insufficient documentation

## 2021-12-23 DIAGNOSIS — Z7901 Long term (current) use of anticoagulants: Secondary | ICD-10-CM | POA: Insufficient documentation

## 2021-12-23 DIAGNOSIS — R778 Other specified abnormalities of plasma proteins: Secondary | ICD-10-CM

## 2021-12-23 DIAGNOSIS — N289 Disorder of kidney and ureter, unspecified: Secondary | ICD-10-CM | POA: Insufficient documentation

## 2021-12-23 DIAGNOSIS — I5022 Chronic systolic (congestive) heart failure: Secondary | ICD-10-CM | POA: Insufficient documentation

## 2021-12-23 DIAGNOSIS — I480 Paroxysmal atrial fibrillation: Secondary | ICD-10-CM | POA: Diagnosis not present

## 2021-12-23 DIAGNOSIS — Z79899 Other long term (current) drug therapy: Secondary | ICD-10-CM | POA: Diagnosis not present

## 2021-12-23 DIAGNOSIS — R0602 Shortness of breath: Secondary | ICD-10-CM | POA: Diagnosis present

## 2021-12-23 DIAGNOSIS — N189 Chronic kidney disease, unspecified: Secondary | ICD-10-CM | POA: Diagnosis not present

## 2021-12-23 DIAGNOSIS — R7989 Other specified abnormal findings of blood chemistry: Secondary | ICD-10-CM | POA: Diagnosis not present

## 2021-12-23 LAB — LACTIC ACID, PLASMA: Lactic Acid, Venous: 2 mmol/L (ref 0.5–1.9)

## 2021-12-23 LAB — URINALYSIS, ROUTINE W REFLEX MICROSCOPIC
Bilirubin Urine: NEGATIVE
Glucose, UA: >=500 mg/dL — AB
Hgb urine dipstick: NEGATIVE
Ketones, ur: NEGATIVE mg/dL
Nitrite: NEGATIVE
Protein, ur: 100 mg/dL — AB
Specific Gravity, Urine: 1.01 (ref 1.005–1.030)
WBC, UA: >50 WBC/hpf — ABNORMAL HIGH (ref 0–5)
pH: 5 (ref 5.0–8.0)

## 2021-12-23 LAB — BRAIN NATRIURETIC PEPTIDE: B Natriuretic Peptide: 1994.6 pg/mL — ABNORMAL HIGH (ref 0.0–100.0)

## 2021-12-23 LAB — CBC
HCT: 51.6 % (ref 39.0–52.0)
Hemoglobin: 17 g/dL (ref 13.0–17.0)
MCH: 27.2 pg (ref 26.0–34.0)
MCHC: 32.9 g/dL (ref 30.0–36.0)
MCV: 82.7 fL (ref 80.0–100.0)
Platelets: 160 10*3/uL (ref 150–400)
RBC: 6.24 MIL/uL — ABNORMAL HIGH (ref 4.22–5.81)
RDW: 18.4 % — ABNORMAL HIGH (ref 11.5–15.5)
WBC: 7.4 10*3/uL (ref 4.0–10.5)
nRBC: 0 % (ref 0.0–0.2)

## 2021-12-23 LAB — BASIC METABOLIC PANEL
Anion gap: 12 (ref 5–15)
BUN: 21 mg/dL (ref 8–23)
CO2: 25 mmol/L (ref 22–32)
Calcium: 9.6 mg/dL (ref 8.9–10.3)
Chloride: 101 mmol/L (ref 98–111)
Creatinine, Ser: 2.27 mg/dL — ABNORMAL HIGH (ref 0.61–1.24)
GFR, Estimated: 30 mL/min — ABNORMAL LOW (ref >60)
Glucose, Bld: 100 mg/dL — ABNORMAL HIGH (ref 70–99)
Potassium: 3.6 mmol/L (ref 3.5–5.1)
Sodium: 138 mmol/L (ref 135–145)

## 2021-12-23 LAB — TROPONIN I (HIGH SENSITIVITY)
Troponin I (High Sensitivity): 29 ng/L — ABNORMAL HIGH (ref <18)
Troponin I (High Sensitivity): 25 ng/L — ABNORMAL HIGH (ref <18)

## 2021-12-23 MED ORDER — CEPHALEXIN 500 MG PO CAPS: 500.0000 mg | ORAL_CAPSULE | Freq: Three times a day (TID) | ORAL | 0 refills | Status: AC

## 2021-12-23 MED ORDER — SODIUM CHLORIDE 0.9 % IV SOLN
1.0000 g | Freq: Once | INTRAVENOUS | Status: AC
  Administered 2021-12-23: 1 g via INTRAVENOUS
  Filled 2021-12-23: qty 10

## 2021-12-23 MED ORDER — LACTATED RINGERS IV BOLUS: 1000.0000 mL | Freq: Once | INTRAVENOUS | Status: DC

## 2021-12-23 NOTE — TOC CM/SW Note (Signed)
Transition of Care [facility]) - Emergency Department Mini Assessment   Patient Details  Name: Evan Serrano MRN: [id number] Date of Birth: [birth year]  Transition of Care [facility] CM/SW Contact:    Lavenia Atlas, RN Phone Number: 12/23/2021, 9:02 AM   Clinical Narrative: Patient presents to [facility] ED with c/o oxygen concentrator not working x2 days and shortness of breathe. RNCM received TOC consult to assist with oxygen.   ED Mini Assessment:  RNCM working remote, called patient's room and cell phone with no response. RNCM spoke with patient's wife, Zenon Mayo via phone who states the patient has oxygen at home and it works fine. Patient's wife reports patient has 4 green bottles with paper on it and " he's not out of oxygen".    [facility] who [facility] SW is Mallie Darting [phone] ext [phone]. This RNCM left message for Jasmine to call back regarding patient's oxygen concentrator.    Addendum: Per chart review patient has home oxygen with [facility] [phone]. This RNCM spoke with Wylene Men with [employer] who advised as of Jan 2023 patient is no longer with [facility], [facility] with [facility]. This RNCM still awaiting call back from [place].   RNCM spoke with patient, he reports the [facility] stopped [facility] his [facility] in June. On Thursday the oxygen concentrator stopped working and the [place] will not pay to have it replaced. Patient reports he is able to pay co-pay with [employer], if they can provide an O2 concentrator. Patient reports he has portable oxygen however he does not know how to use it. RNCM will request home oxygen orders and walking sats note to get patient set up with [employer].RN & EDP notified. Patient reports he has transportation.     TOC will continue to follow.  Patient Contact and Communications    Dyckman,SHEENA (Spouse)  [phone] (Mobile) Patient he does not use his cell phone only in emergency, call his room.        Admission diagnosis:  shob Patient Active Problem  List   Diagnosis Date Noted   Depression 06/12/2021   Implantable cardioverter-defibrillator (ICD) in situ 06/12/2021   Hyperlipidemia 06/12/2021   Obesity 06/12/2021   Obstructive sleep apnea syndrome 06/12/2021   Acute systolic congestive heart failure (HCC) 01/14/2021   Malnutrition of moderate degree 01/14/2021   CHF (congestive heart failure) (HCC) 01/12/2021   Orthostatic hypotension 04/20/2019   COVID-19 virus infection    Renal insufficiency    AKI (acute kidney injury) (HCC) 04/19/2019   Chronic HFrEF (heart failure with reduced ejection fraction) (HCC) 04/19/2019   Syncope due to orthostatic hypotension 04/19/2019   PCP:  Clinic, [facility] Pharmacy:   [facility] - [place], [place] - [address] [place] [address] Phone: [phone] Fax: [phone]  [facility] [place], [place] - [address] [place] [address] Phone: [phone] Fax: [phone]  [facility] [address] [place] [address] Phone: [phone] Fax: [phone]

## 2021-12-23 NOTE — ED Notes (Signed)
Patient ambulated to bathroom and back NAD noted

## 2021-12-23 NOTE — ED Provider Notes (Signed)
[facility] EMERGENCY DEPARTMENT Provider Note   CSN: [id number] Arrival date & time: 12/23/21  0009     History  Chief Complaint  Patient presents with   Shortness of Breath    Evan Serrano is a 71 y.o. male.  The history is provided by the patient.  Shortness of Breath He has history of hypertension, hyperlipidemia, systolic heart failure with reduced ejection fraction, chronic kidney disease, paroxysmal atrial fibrillation anticoagulated on apixaban and comes in because of shortness of breath.  He states he has at home oxygen concentrator which alarms were going off starting 2 days ago so he turned it off.  He has been having some intermittent heaviness in his chest and has had some lightheadedness when he coughs.  He denies any nausea, vomiting, diaphoresis.  He denies fever or chills.   Home Medications Prior to Admission medications   Medication Sig Start Date End Date Taking? Authorizing Provider  albuterol (VENTOLIN HFA) 108 (90 Base) MCG/ACT inhaler Inhale 2 puffs into the lungs every 6 (six) hours as needed for wheezing.    [provider]  amiodarone (PACERONE) 200 MG tablet Take 1 tablet (200 mg total) by mouth daily. 12/17/21   Milford, Anderson Malta, FNP  apixaban (ELIQUIS) 5 MG TABS tablet Take 1 tablet (5 mg total) by mouth 2 (two) times daily. 06/17/21   Kathlen Mody, MD  atorvastatin (LIPITOR) 40 MG tablet Take 1 tablet (40 mg total) by mouth daily. 06/18/21   Kathlen Mody, MD  empagliflozin (JARDIANCE) 25 MG TABS tablet Patient takes 0.5 tablet daily.    [provider]  fluticasone-salmeterol (ADVAIR DISKUS) 250-50 MCG/ACT AEPB Inhale 1 puff into the lungs in the morning and at bedtime. 11/05/21   Martina Sinner, MD  Glucerna (GLUCERNA) LIQD Take 237 mLs by mouth daily in the afternoon.    [provider]  loratadine (CLARITIN) 10 MG tablet Take 10 mg by mouth daily.    [provider]  metoprolol succinate  (TOPROL XL) 25 MG 24 hr tablet Take 1 tablet (25 mg total) by mouth at bedtime. 12/17/21 12/17/22  Jacklynn Ganong, FNP  Multiple Vitamin (MULTIVITAMIN WITH MINERALS) TABS tablet Take 1 tablet by mouth daily. 01/14/21   Elgergawy, Leana Roe, MD  OXYGEN Inhale 2 L into the lungs continuous.    [provider]  potassium chloride SA (KLOR-CON M) 20 MEQ tablet Take 2 tablets (40 mEq total) by mouth daily. 10/18/21   Milford, Anderson Malta, FNP  torsemide (DEMADEX) 20 MG tablet Take 4 tablets (80 mg total) by mouth every morning. 10/18/21   Milford, Anderson Malta, FNP  Vitamin D, Cholecalciferol, 10 MCG (400 UNIT) CAPS Take 800 Units by mouth daily.    [provider]      Allergies    Iodinated contrast media    Review of Systems   Review of Systems  Respiratory:  Positive for shortness of breath.   All other systems reviewed and are negative.   Physical Exam Updated Vital Signs BP 98/66   Pulse 80   Temp 97.9 F (36.6 C)   Resp 18   SpO2 96%  Physical Exam Vitals and nursing note reviewed.   71 year old male, resting comfortably and in no acute distress. Vital signs are significant for borderline low blood pressure. Oxygen saturation is 96%, which is normal. Head is normocephalic and atraumatic. PERRLA, EOMI. Oropharynx is clear. Neck is nontender and supple without adenopathy or JVD. Back is nontender  and there is no CVA tenderness. Lungs are clear without rales, wheezes, or rhonchi. Chest is nontender. Heart has regular rate and rhythm without murmur. Abdomen is soft, flat, nontender. Extremities have no cyanosis or edema, full range of motion is present. Skin is warm and dry without rash. Neurologic: Mental status is normal, cranial nerves are intact, moves all extremities equally.  ED Results / Procedures / Treatments   Labs (all labs ordered are listed, but only abnormal results are displayed) Labs Reviewed  BASIC METABOLIC PANEL - Abnormal; Notable for the  following components:      Result Value   Glucose, Bld 100 (*)    Creatinine, Ser 2.27 (*)    GFR, Estimated 30 (*)    All other components within normal limits  CBC - Abnormal; Notable for the following components:   RBC 6.24 (*)    RDW 18.4 (*)    All other components within normal limits  BRAIN NATRIURETIC PEPTIDE - Abnormal; Notable for the following components:   B Natriuretic Peptide 1,994.6 (*)    All other components within normal limits  TROPONIN I (HIGH SENSITIVITY) - Abnormal; Notable for the following components:   Troponin I (High Sensitivity) 29 (*)    All other components within normal limits  LACTIC ACID, PLASMA  URINALYSIS, ROUTINE W REFLEX MICROSCOPIC    EKG EKG Interpretation  Date/Time:  Monday December 23 2021 00:23:48 EDT Ventricular Rate:  122 PR Interval:  286 QRS Duration: 90 QT Interval:  204 QTC Calculation: 290 R Axis:   52 Text Interpretation: Sinus rhythm Premature ventricular complexes Left bundle branch block Abnormal ECG When compared with ECG of 17-Dec-2021 10:57, No significant change was found Confirmed by Dione Booze ([id number]) on 12/23/2021 12:27:55 AM  Radiology No results found.  Procedures Procedures  Cardiac monitor shows sinus rhythm with frequent PVCs, per my interpretation.  Medications Ordered in ED Medications - No data to display  ED Course/ Medical Decision Making/ A&P                           Medical Decision Making Amount and/or Complexity of Data Reviewed Labs: ordered. Radiology: ordered.   Shortness of breath related to failure of his home oxygen concentrator.  Intermittent chest pain, possible angina equivalent.  Doubt pneumonia, pneumothorax, pleural effusion.  I have ordered a chest x-ray.  I have ordered laboratory tests of CBC, basic metabolic panel, BNP, troponin.  I have reviewed and interpreted the laboratory tests and my interpretation is acute on chronic renal insufficiency with creatinine 2.27 compared with  1.87 on 12/17/2021.  Markedly elevated BNP is not significantly changed from 7/18, mildly elevated troponin is in a range that he has been in the past, will need to repeat.  Old records are reviewed, and echocardiogram on 06/13/2021 showed left ventricular ejection fraction of 20-25% with global hypokinesis and dilated left ventricular cavity and grade 3 diastolic dysfunction.  Repeat troponin has actually declined slightly, no evidence of ACS or ongoing cardiac ischemia.  However, he is not safe for discharge since his home oxygen generator is not functioning.  He will be held in the emergency department with consultation to transitions of care to try to arrange appropriate home oxygen for him.  Case is signed out to Dr. Durwin Nora.  Final Clinical Impression(s) / ED Diagnoses Final diagnoses:  Shortness of breath  Chronic systolic heart failure (HCC)  Elevated troponin  Renal insufficiency    Rx /  DC Orders ED Discharge Orders     None         Dione Booze, MD 12/23/21 [phone]

## 2021-12-23 NOTE — ED Provider Notes (Signed)
Care of patient assumed from Dr. Preston Fleeting.  This patient is here for failure of home O2 device.  Social work to assist with O2 needs. Physical Exam  BP 92/78   Pulse 65   Temp 98.1 F (36.7 C) (Oral)   Resp (!) 26   Ht 5\' 8"  (1.727 m)   Wt 84 kg   SpO2 97%   BMI 28.16 kg/m   Physical Exam Vitals and nursing note reviewed.  Constitutional:      General: He is not in acute distress.    Appearance: He is well-developed. He is not ill-appearing, toxic-appearing or diaphoretic.  HENT:     Head: Normocephalic and atraumatic.     Mouth/Throat:     Mouth: Mucous membranes are moist.  Eyes:     Extraocular Movements: Extraocular movements intact.     Conjunctiva/sclera: Conjunctivae normal.  Cardiovascular:     Rate and Rhythm: Normal rate and regular rhythm.  Pulmonary:     Effort: Pulmonary effort is normal. No respiratory distress.  Abdominal:     Palpations: Abdomen is soft.  Musculoskeletal:        General: No swelling.     Cervical back: Normal range of motion and neck supple.  Skin:    General: Skin is warm and dry.  Neurological:     General: No focal deficit present.     Mental Status: He is alert and oriented to person, place, and time.  Psychiatric:        Mood and Affect: Mood normal.        Behavior: Behavior normal.     Procedures  Procedures  ED Course / MDM    Medical Decision Making Amount and/or Complexity of Data Reviewed Labs: ordered.  Risk Prescription drug management.   On assessment, patient is resting comfortably.  He is on 3 L of supplemental oxygen with SPO2 of 100%.  His urinalysis did show some evidence of UTI.  When questioned about this, patient states that he has had some dysuria lately.  Dose of ceftriaxone was ordered while in the ED.  Social work has been working to assist with his home oxygen.  After thorough efforts with social work, coordinations were made for home oxygen through [employer].  Patient was prescribed Keflex for ongoing  treatment of UTI.  He was discharged in stable condition.       , MD 12/23/21 [phone]

## 2021-12-23 NOTE — ED Notes (Signed)
Patient ambulated to bathroom and back with assistance, patient uses a cane, patient back in bed. Cardiac monitor in place. NAD noted. Call light in reach.

## 2021-12-23 NOTE — ED Notes (Signed)
Discussed patient's BP's with Dr. Preston Fleeting and due to patient's cardiac hx, EF, and being asymptomatic at this time, MD does not indicate any treatment. Patient is resting in bed, denies any lightheadedness or dizziness at this time.

## 2021-12-23 NOTE — Discharge Planning (Signed)
EDRN contacting [employer] concerning home oxygen needs.

## 2021-12-23 NOTE — ED Notes (Signed)
Pt verbalizes understanding of discharge instructions. Opportunity for questions and answers were provided. Pt discharged from the ED.   ?

## 2021-12-23 NOTE — ED Notes (Signed)
Pt ambulatory 38ft in hallway on room air. Pt SpO2 84-87% while ambulating.

## 2021-12-23 NOTE — ED Notes (Signed)
SATURATION QUALIFICATIONS: (This note is used to comply with regulatory documentation for home oxygen)  Patient Saturations on Room Air at Rest = 88%  Patient Saturations on Room Air while Ambulating = 84%  Patient Saturations on 2 Liters of oxygen while Ambulating = 95%  Please briefly explain why patient needs home oxygen: hypoxia

## 2021-12-23 NOTE — ED Triage Notes (Signed)
Patient stated that his oxygen concentrator is out of order since last week , reports SOB today with occasional dry cough .

## 2021-12-23 NOTE — ED Notes (Signed)
Pt O2 Stats stayed at 97 lying and standing and sitting with no O2 on at all.

## 2021-12-23 NOTE — ED Notes (Signed)
Patient resting in bed asleep, nad noted.  

## 2021-12-23 NOTE — Discharge Planning (Signed)
RNCM called patient's room phone line is busy. This RNCM spoke with patient's wife who states patient still has his oxygen from the [place], however someone from the [place] just called stating they will come pick equipment up once patient is at home. Per Zack at [facility], they are unable to provide equipment until [place] has picked up their oxygen. RNCM sent referral with oxygen order and ambulatory note.  Patient has transportation home and can be discharged with his portable oxygen.  RNCM spoke with patient to explain [facility] will not be able to assist until [place] [facility] their oxygen. [facility] is [facility]. RNCM confirmed with Zack at [facility] will folllow up post discharge. RNCM advised patient to use his portable oxygen. This RNCM [phone] [facility] [phone] ext [phone], with no response. Patient states he will go home however he will come back if he starts to feel short of breath again. This RNCM advised [facility]'s information is on his discharge information. Please call once he knows when the [place] will come to pick up oxygen.   No additional  TOC needs at this time.

## 2021-12-23 NOTE — ED Notes (Signed)
Dr. Preston Fleeting made aware of Lactic acid of 2.0. No new orders at this time.

## 2021-12-23 NOTE — Discharge Instructions (Addendum)
[facility] should be following up with you regarding your home oxygen.  Please contact Zack with [facility] at [phone] with any issues.  A prescription for an antibiotic was sent to your pharmacy.  This is to treat a urine infection.  Take as prescribed.  Return to the emergency department at any time for any new or worsening symptoms of concern.

## 2022-01-29 ENCOUNTER — Ambulatory Visit (INDEPENDENT_AMBULATORY_CARE_PROVIDER_SITE_OTHER): Payer: No Typology Code available for payment source | Admitting: Pulmonary Disease

## 2022-01-29 ENCOUNTER — Encounter: Payer: Self-pay | Admitting: Pulmonary Disease

## 2022-01-29 VITALS — BP 120/72 | HR 87 | Ht 68.0 in | Wt 193.2 lb

## 2022-01-29 DIAGNOSIS — J449 Chronic obstructive pulmonary disease, unspecified: Secondary | ICD-10-CM

## 2022-01-29 DIAGNOSIS — G4733 Obstructive sleep apnea (adult) (pediatric): Secondary | ICD-10-CM

## 2022-01-29 MED ORDER — SPIRIVA RESPIMAT 2.5 MCG/ACT IN AERS: 2.0000 | INHALATION_SPRAY | Freq: Every day | RESPIRATORY_TRACT | 11 refills | Status: AC

## 2022-01-29 NOTE — Progress Notes (Signed)
Synopsis: Referred in June 2023 for cough by Freada Bergeron, MD  Subjective:   PATIENT ID: Evan Serrano GENDER: male DOB: Nov 29, 1950, MRN: [id number]  HPI  Chief Complaint  Patient presents with   Follow-up    PT is feeling well, he is using Advair daily & Al in. BID. He states he has felt better the last 5 days than he was in a while. Does report using 2.5L of o2 @ home   Evan Serrano is a 71 year old male, never smoker with hypertension, HFrEF of 20-25%, orthostatic hypotension, paroxysmal atrial fibrillation, OSA on CPAP, CKDIIIa and chronic hypoxemic respiratory failure who returns to pulmonary clinic for cough.   He was started on advair diskus 250-51mcg 1 puff twice daily at last visit. He is feeling much better with less cough and shortness of breath. He is using albuterol 2-4 times per day for dyspnea.  He is being evaluated for the [facility] device through the [place] clinic. He did not tolerate CPAP mask in the past. He is followed by Dr. Mayford Knife for sleep apnea.  He has not had PFTs done yet.  OV 11/05/21 He was seen by cardiology 10/18/21, note reviewed. [facility] Cardiac scan is equivocal for transthyretin amyloidosis.   He reports coughing fits over the past 2 years that can lead to dyspnea and post-tussive emesis. He produces clear mucous. He denies much wheezing. He does have seasonal allergies. He was recently started on zyrtec. He is using albuterol as needed with some relief. He is using 2L of oxygen which he was started on from his admission 06/2021. He has not been using CPAP since last year. He has been scheduled for CPAP evaluation by the cardiology team.   He reports having a CT Chest scan at the [place] earlier this year.   He was in the [employer]. 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 He was in [place] in the 1970s.   FH: Heart failure - Mother/father. Oldest sister with heart failure  Past Medical History:   Diagnosis Date   Cardiac pacemaker    COVID    Hypertension    Presence of cardiac defibrillator      Family History  Problem Relation Age of Onset   Heart attack Mother    Heart attack Father      Social History   Socioeconomic History   Marital status: Married    Spouse name: Not on file   Number of children: Not on file   Years of education: Not on file   Highest education level: Not on file  Occupational History   Not on file  Tobacco Use   Smoking status: Never   Smokeless tobacco: Never  Vaping Use   Vaping Use: Never used  Substance and Sexual Activity   Alcohol use: Never   Drug use: Never   Sexual activity: Not Currently  Other Topics Concern   Not on file  Social History Narrative   Not on file   Social Determinants of Health   Financial Resource Strain: Not on file  Food Insecurity: No Food Insecurity (04/21/2019)   Hunger Vital Sign    Worried About Running Out of Food in the Last Year: Never true    Ran Out of Food in the Last Year: Never true  Transportation Needs: No Transportation Needs (04/21/2019)   PRAPARE - Transportation    Lack of Transportation (Medical): No    Lack  of Transportation (Non-Medical): No  Physical Activity: Inactive (04/21/2019)   Exercise Vital Sign    Days of Exercise per Week: 0 days    Minutes of Exercise per Session: 0 min  Stress: No Stress Concern Present (04/21/2019)   [employer] of Occupational Health - Occupational Stress Questionnaire    Feeling of Stress : Only a little  Social Connections: Moderately Integrated (04/21/2019)   Social Connection and Isolation Panel [NHANES]    Frequency of Communication with Friends and Family: Three times a week    Frequency of Social Gatherings with Friends and Family: Once a week    Attends Religious Services: More than 4 times per year    Active Member of [employer] or Organizations: No    Attends Banker Meetings: Never    Marital Status: Married   Catering manager Violence: Not At Risk (04/21/2019)   Humiliation, Afraid, Rape, and Kick questionnaire    Fear of Current or Ex-Partner: No    Emotionally Abused: No    Physically Abused: No    Sexually Abused: No     Allergies  Allergen Reactions   Iodinated Contrast Media Shortness Of Breath, Rash and Cough     Outpatient Medications Prior to Visit  Medication Sig Dispense Refill   albuterol (VENTOLIN HFA) 108 (90 Base) MCG/ACT inhaler Inhale 2 puffs into the lungs every 6 (six) hours as needed for wheezing.     amiodarone (PACERONE) 200 MG tablet Take 1 tablet (200 mg total) by mouth daily. 90 tablet 3   apixaban (ELIQUIS) 5 MG TABS tablet Take 1 tablet (5 mg total) by mouth 2 (two) times daily. 60 tablet 3   atorvastatin (LIPITOR) 40 MG tablet Take 1 tablet (40 mg total) by mouth daily. 60 tablet 1   empagliflozin (JARDIANCE) 25 MG TABS tablet Take 10 mg by mouth daily.     fluticasone-salmeterol (ADVAIR DISKUS) 250-50 MCG/ACT AEPB Inhale 1 puff into the lungs in the morning and at bedtime. 60 each 6   Glucerna (GLUCERNA) LIQD Take 237 mLs by mouth daily in the afternoon.     loratadine (CLARITIN) 10 MG tablet Take 10 mg by mouth daily.     metoprolol succinate (TOPROL XL) 25 MG 24 hr tablet Take 1 tablet (25 mg total) by mouth at bedtime. 30 tablet 11   Multiple Vitamin (MULTIVITAMIN WITH MINERALS) TABS tablet Take 1 tablet by mouth daily.     OXYGEN Inhale 2 L into the lungs continuous.     potassium chloride SA (KLOR-CON M) 20 MEQ tablet Take 2 tablets (40 mEq total) by mouth daily. 60 tablet 5   torsemide (DEMADEX) 20 MG tablet Take 4 tablets (80 mg total) by mouth every morning. 120 tablet 5   Vitamin D, Cholecalciferol, 10 MCG (400 UNIT) CAPS Take 800 Units by mouth daily.     No facility-administered medications prior to visit.   Review of Systems  Constitutional:  Negative for chills, fever, malaise/fatigue and weight loss.  HENT:  Negative for congestion, sinus pain  and sore throat.   Eyes: Negative.   Respiratory:  Positive for shortness of breath. Negative for cough, hemoptysis, sputum production and wheezing.   Cardiovascular:  Negative for chest pain, palpitations, orthopnea, claudication and leg swelling.  Gastrointestinal:  Negative for abdominal pain, heartburn, nausea and vomiting.  Genitourinary: Negative.   Musculoskeletal:  Negative for joint pain and myalgias.  Skin:  Negative for rash.  Neurological:  Negative for weakness.  Endo/Heme/Allergies: Negative.  Psychiatric/Behavioral: Negative.      Objective:   Vitals:   01/29/22 1033  BP: 120/72  Pulse: 87  SpO2: 97%  Weight: 193 lb 3.2 oz (87.6 kg)  Height: 5\' 8"  (1.727 m)   Physical Exam Constitutional:      General: He is not in acute distress. HENT:     Head: Normocephalic and atraumatic.  Cardiovascular:     Rate and Rhythm: Normal rate and regular rhythm.     Pulses: Normal pulses.     Heart sounds: Normal heart sounds. No murmur heard. Pulmonary:     Effort: Pulmonary effort is normal.     Breath sounds: Normal breath sounds. No wheezing or rhonchi.  Musculoskeletal:     Right lower leg: No edema.     Left lower leg: No edema.  Skin:    General: Skin is warm and dry.  Neurological:     General: No focal deficit present.     Mental Status: He is alert.  Psychiatric:        Mood and Affect: Mood normal.        Behavior: Behavior normal.        Thought Content: Thought content normal.        Judgment: Judgment normal.    CBC    Component Value Date/Time   WBC 7.4 12/23/2021 0026   RBC 6.24 (H) 12/23/2021 0026   HGB 17.0 12/23/2021 0026   HCT 51.6 12/23/2021 0026   PLT 160 12/23/2021 0026   MCV 82.7 12/23/2021 0026   MCH 27.2 12/23/2021 0026   MCHC 32.9 12/23/2021 0026   RDW 18.4 (H) 12/23/2021 0026   LYMPHSABS 2.9 11/27/2021 2007   MONOABS 0.6 11/27/2021 2007   EOSABS 0.2 11/27/2021 2007   BASOSABS 0.0 11/27/2021 2007      Latest Ref Rng & Units  12/23/2021   12:26 AM 12/17/2021   11:11 AM 11/27/2021    8:07 PM  BMP  Glucose 70 - 99 mg/dL 11/29/2021  88  992   BUN 8 - 23 mg/dL 21  20  27    Creatinine 0.61 - 1.24 mg/dL 426   8.34   Sodium 135 - 145 mmol/L 138  138  135   Potassium 3.5 - 5.1 mmol/L 3.6  3.6  4.6   Chloride 98 - 111 mmol/L 101  103  99   CO2 22 - 32 mmol/L 25  29  24    Calcium 8.9 - 10.3 mg/dL 9.6  9.6  9.3    Chest imaging: CXR 10/15/21 Stable cardiomegaly. Single lead left-sided defibrillator is unchanged in position. Both lungs are clear. The visualized skeletal structures are unremarkable.  PFT:     No data to display          Labs:  Path:  Echo:  Heart Catheterization:  Assessment & Plan:   Chronic obstructive pulmonary disease, unspecified COPD type (HCC) - Plan: Tiotropium Bromide Monohydrate (SPIRIVA RESPIMAT) 2.5 MCG/ACT AERS  Obstructive sleep apnea syndrome  Discussion: Evan Serrano is a 71 year old male, never smoker with hypertension, HFrEF of 20-25%, orthostatic hypotension, paroxysmal atrial fibrillation, OSA on CPAP, CKDIIIa and chronic hypoxemic respiratory failure who returns to pulmonary clinic for COPD.   Patient likely has COPD or moderate/severe persistent asthma. He has responded well to ICS/LABA therapy with wixella 250-69mcg 1 puff twice daily. We will add spiriva 2.67mcg 2 puffs daily given his on going use of albuterol throughout the day.   He will be scheduled  for PFTs in the future.  Follow up in 6 months.  Melody Comas, MD [facility] Office: [phone]   Current Outpatient Medications:    albuterol (VENTOLIN HFA) 108 (90 Base) MCG/ACT inhaler, Inhale 2 puffs into the lungs every 6 (six) hours as needed for wheezing., Disp: , Rfl:    amiodarone (PACERONE) 200 MG tablet, Take 1 tablet (200 mg total) by mouth daily., Disp: 90 tablet, Rfl: 3   apixaban (ELIQUIS) 5 MG TABS tablet, Take 1 tablet (5 mg total) by mouth 2 (two) times  daily., Disp: 60 tablet, Rfl: 3   atorvastatin (LIPITOR) 40 MG tablet, Take 1 tablet (40 mg total) by mouth daily., Disp: 60 tablet, Rfl: 1   empagliflozin (JARDIANCE) 25 MG TABS tablet, Take 10 mg by mouth daily., Disp: , Rfl:    fluticasone-salmeterol (ADVAIR DISKUS) 250-50 MCG/ACT AEPB, Inhale 1 puff into the lungs in the morning and at bedtime., Disp: 60 each, Rfl: 6   Glucerna (GLUCERNA) LIQD, Take 237 mLs by mouth daily in the afternoon., Disp: , Rfl:    loratadine (CLARITIN) 10 MG tablet, Take 10 mg by mouth daily., Disp: , Rfl:    metoprolol succinate (TOPROL XL) 25 MG 24 hr tablet, Take 1 tablet (25 mg total) by mouth at bedtime., Disp: 30 tablet, Rfl: 11   Multiple Vitamin (MULTIVITAMIN WITH MINERALS) TABS tablet, Take 1 tablet by mouth daily., Disp: , Rfl:    OXYGEN, Inhale 2 L into the lungs continuous., Disp: , Rfl:    potassium chloride SA (KLOR-CON M) 20 MEQ tablet, Take 2 tablets (40 mEq total) by mouth daily., Disp: 60 tablet, Rfl: 5   Tiotropium Bromide Monohydrate (SPIRIVA RESPIMAT) 2.5 MCG/ACT AERS, Inhale 2 puffs into the lungs daily., Disp: 4 g, Rfl: 11   torsemide (DEMADEX) 20 MG tablet, Take 4 tablets (80 mg total) by mouth every morning., Disp: 120 tablet, Rfl: 5   Vitamin D, Cholecalciferol, 10 MCG (400 UNIT) CAPS, Take 800 Units by mouth daily., Disp: , Rfl:

## 2022-01-29 NOTE — Patient Instructions (Addendum)
Continue advair diskus 250-7mcg 1 puff twice daily - rinse mouth out after each use  Start spiriva 2.76mcg 2 puffs daily  Use albuterol 1-2 puffs every 4-6 hours as needed.   Follow up in 6 months with pulmonary function tests

## 2022-02-11 ENCOUNTER — Encounter (HOSPITAL_COMMUNITY): Payer: Self-pay | Admitting: Internal Medicine

## 2022-02-11 ENCOUNTER — Ambulatory Visit (HOSPITAL_COMMUNITY)
Admission: RE | Admit: 2022-02-11 | Discharge: 2022-02-11 | Disposition: A | Discharge status: Home / Self Care | Payer: No Typology Code available for payment source | Source: Ambulatory Visit | Attending: Internal Medicine | Admitting: Internal Medicine

## 2022-02-11 VITALS — BP 92/60 | HR 86 | Wt 196.0 lb

## 2022-02-11 DIAGNOSIS — G4733 Obstructive sleep apnea (adult) (pediatric): Secondary | ICD-10-CM | POA: Insufficient documentation

## 2022-02-11 DIAGNOSIS — E1122 Type 2 diabetes mellitus with diabetic chronic kidney disease: Secondary | ICD-10-CM | POA: Diagnosis not present

## 2022-02-11 DIAGNOSIS — I2510 Atherosclerotic heart disease of native coronary artery without angina pectoris: Secondary | ICD-10-CM | POA: Insufficient documentation

## 2022-02-11 DIAGNOSIS — I493 Ventricular premature depolarization: Secondary | ICD-10-CM

## 2022-02-11 DIAGNOSIS — I5022 Chronic systolic (congestive) heart failure: Secondary | ICD-10-CM

## 2022-02-11 DIAGNOSIS — J9611 Chronic respiratory failure with hypoxia: Secondary | ICD-10-CM | POA: Diagnosis not present

## 2022-02-11 DIAGNOSIS — N1832 Chronic kidney disease, stage 3b: Secondary | ICD-10-CM | POA: Insufficient documentation

## 2022-02-11 DIAGNOSIS — I441 Atrioventricular block, second degree: Secondary | ICD-10-CM | POA: Insufficient documentation

## 2022-02-11 DIAGNOSIS — I4901 Ventricular fibrillation: Secondary | ICD-10-CM | POA: Insufficient documentation

## 2022-02-11 DIAGNOSIS — N1831 Chronic kidney disease, stage 3a: Secondary | ICD-10-CM

## 2022-02-11 DIAGNOSIS — I480 Paroxysmal atrial fibrillation: Secondary | ICD-10-CM | POA: Diagnosis not present

## 2022-02-11 DIAGNOSIS — Z95810 Presence of automatic (implantable) cardiac defibrillator: Secondary | ICD-10-CM | POA: Diagnosis not present

## 2022-02-11 DIAGNOSIS — Z79899 Other long term (current) drug therapy: Secondary | ICD-10-CM | POA: Insufficient documentation

## 2022-02-11 DIAGNOSIS — I130 Hypertensive heart and chronic kidney disease with heart failure and stage 1 through stage 4 chronic kidney disease, or unspecified chronic kidney disease: Secondary | ICD-10-CM | POA: Insufficient documentation

## 2022-02-11 DIAGNOSIS — Z7901 Long term (current) use of anticoagulants: Secondary | ICD-10-CM | POA: Insufficient documentation

## 2022-02-11 DIAGNOSIS — I5042 Chronic combined systolic (congestive) and diastolic (congestive) heart failure: Secondary | ICD-10-CM | POA: Insufficient documentation

## 2022-02-11 LAB — BASIC METABOLIC PANEL
Anion gap: 6 (ref 5–15)
BUN: 20 mg/dL (ref 8–23)
CO2: 26 mmol/L (ref 22–32)
Calcium: 9.3 mg/dL (ref 8.9–10.3)
Chloride: 107 mmol/L (ref 98–111)
Creatinine, Ser: 1.76 mg/dL — ABNORMAL HIGH (ref 0.61–1.24)
GFR, Estimated: 41 mL/min — ABNORMAL LOW (ref >60)
Glucose, Bld: 80 mg/dL (ref 70–99)
Potassium: 4.2 mmol/L (ref 3.5–5.1)
Sodium: 139 mmol/L (ref 135–145)

## 2022-02-11 LAB — BRAIN NATRIURETIC PEPTIDE: B Natriuretic Peptide: 1270.1 pg/mL — ABNORMAL HIGH (ref 0.0–100.0)

## 2022-02-11 NOTE — Progress Notes (Signed)
ReDS Vest / Clip - 02/11/22 1000       ReDS Vest / Clip   Station Marker C    Ruler Value 31    ReDS Value Range Low volume    ReDS Actual Value 33

## 2022-02-11 NOTE — Patient Instructions (Signed)
Medication Changes:  Decrease Torsemide to 60 mg (3 tabs) Daily  Lab Work:  Labs done today, we will call you for abnormal results  Testing/Procedures:  Your physician has recommended that you have a cardiopulmonary stress test (CPX). CPX testing is a non-invasive measurement of heart and lung function. It replaces a traditional treadmill stress test. This type of test provides a tremendous amount of information that relates not only to your present condition but also for future outcomes. This test combines measurements of you ventilation, respiratory gas exchange in the lungs, electrocardiogram (EKG), blood pressure and physical response before, during, and following an exercise protocol.  Your physician has requested that you have an echocardiogram. Echocardiography is a painless test that uses sound waves to create images of your heart. It provides your doctor with information about the size and shape of your heart and how well your heart's chambers and valves are working. This procedure takes approximately one hour. There are no restrictions for this procedure. IN 3 MONTHS  Referrals:  NONE  Special Instructions // Education:  Do the following things EVERYDAY: Weigh yourself in the morning before breakfast. Write it down and keep it in a log. Take your medicines as prescribed Eat low salt foods--Limit salt (sodium) to 2000 mg per day.  Stay as active as you can everyday Limit all fluids for the day to less than 2 liters   Follow-Up in: 3 months with an echocardiogram  At the [facility], you and your health needs are our priority. We have a designated team specialized in the treatment of Heart Failure. This Care Team includes your primary Heart Failure Specialized Cardiologist (physician), Advanced Practice Providers (APPs- Physician Assistants and Nurse Practitioners), and Pharmacist who all work together to provide you with the care you need, when you need it.    You may see any of the following providers on your designated Care Team at your next follow up:  Dr. Arvilla Meres Dr. Marca Ancona Dr. Marcos Eke, NP Robbie Lis, [place] [facility], [place] Brynda Peon, NP Karle Plumber, PharmD   Please be sure to bring in all your medications bottles to every appointment.   Need to Contact us:  If you have any questions or concerns before your next appointment please send [place] a message through [place] or call our office at [phone].    TO LEAVE A MESSAGE FOR THE NURSE SELECT OPTION 2, PLEASE LEAVE A MESSAGE INCLUDING: YOUR NAME DATE OF BIRTH CALL BACK NUMBER REASON FOR CALL**this is important as we prioritize the call backs  YOU WILL RECEIVE A CALL BACK THE SAME DAY AS LONG AS YOU CALL BEFORE 4:00 PM

## 2022-02-11 NOTE — Progress Notes (Signed)
ADVANCED HF CLINIC NOTE  Primary Care: [place]  HF Cardiologist: Dr. Haroldine Laws  HPI: Evan Serrano is a 71 y.o. male with HFrEF (EF 20-25%, Echo 06/2021) 2/2 NICM with ICD placed 4098, HTN complicated by orthostatic hypotension, PAF (CHADSVASC 3) on chronic AC with eliquis, OSA on CPAP, chronic hypoxemic respiratory failure on 1L of O2 at home (since 06/2021) and CKD stage IIIa.   HFrEF [place] after patient had URI. He underwent left heart catheterization at that time and was told his coronary arteries were "clean".  On 12/26/222 had ICD (MDT) shock for an episode of ventricular fibrillation after failed ATP.   In 1/23 hospitalized with a/c HF. Echo EF 20-25%, severely dilated LV, G3 DD, severely dilated LA/RA, moderate MR and TR, mild AR. Not significantly changed from 12/2020 echo. LHC mild non obstructive CAD. ~50% mid and distal RCA. RHC: mean PAP 38, RA 9, PCWP 19  Follow up 4/23, ReDs 45%, NYHA II-IIIb. Jardiance added, Zio placed to assess PVC/AF burden. Amyloid work up initiated.  Seen in ED 10/15/21 with SOB, N/V. Had not taken po diuretics yet. Given anti-emetics and discharged.  Zio 5/23 1. Sinus rhythm - avg HR of 77 bpm 2. 84 runs of non-sustained Ventricular Tachycardia runs occurred, the run with the fastest interval lasting 9 beats with a max rate of 226 bpm, the longest lasting 16 beats with an avg rate of 115 bpm. 3. 7 runs of Supraventricular Tachycardia/atrial tach occurred, the run with the fastest interval lasting 17 beats with a max rate of 118 bpm, the longest lasting 19 beats with an avg rate of 103 bpm. 4. Transient nocturnal Second Degree AV Block-Mobitz I (Wenckebach) was present. 5.  Isolated PACs were occasional (1.5%,10801) 6. Isolated PVCs were frequent (10.4%, [id number]), Ventricular Couplets were occasional (3.0%, 10765), and Ventricular Triplets were rare (<1.0%, 1711). There was one primary PVC morphology (10.4%) 7.  Ventricular Bigeminy  and Trigeminy were present 8. There were no patient-triggered events.   PYP 5/23 mildly positive. SPEP negative   Follow up 6/23, BP low and BiDil held. Genetic testing arranged for TTR amyloid.   Seen in ED 11/27/21 with SOB, received DuoNeb not felt to be volume overloaded.   Today he returns for HF follow up. Feels good. Getting around well though SBP remains in 80-90 range. Occasional lightheadedness. No swelling, CP, edema, orthopnea or PND. Gets SOB if tries to go too fast   Cardiac Studies: - PYP (5/23): mildly positive  - R/LHC (1/23): mild non-obs CAD, 50% mid and distal RCA RA 9 PAP mean 38 PCWP 19  - Echo (1/23): EF 20-25%  - Echo (8/22): EF 20-25%, severe LV dysfunction, grade III DD, mildly reduced RV, mild to moderate MR  Past Medical History:  Diagnosis Date   Cardiac pacemaker    COVID    Hypertension    Presence of cardiac defibrillator    Current Outpatient Medications  Medication Sig Dispense Refill   albuterol (VENTOLIN HFA) 108 (90 Base) MCG/ACT inhaler Inhale 2 puffs into the lungs every 6 (six) hours as needed for wheezing.     amiodarone (PACERONE) 200 MG tablet Take 1 tablet (200 mg total) by mouth daily. 90 tablet 3   apixaban (ELIQUIS) 5 MG TABS tablet Take 1 tablet (5 mg total) by mouth 2 (two) times daily. 60 tablet 3   atorvastatin (LIPITOR) 40 MG tablet Take 1 tablet (40 mg total) by mouth daily. 60 tablet 1   empagliflozin (  JARDIANCE) 25 MG TABS tablet Take 12.5 mg by mouth 2 (two) times daily.     fluticasone-salmeterol (ADVAIR DISKUS) 250-50 MCG/ACT AEPB Inhale 1 puff into the lungs in the morning and at bedtime. 60 each 6   Glucerna (GLUCERNA) LIQD Take 237 mLs by mouth daily in the afternoon.     loratadine (CLARITIN) 10 MG tablet Take 10 mg by mouth daily.     metoprolol succinate (TOPROL XL) 25 MG 24 hr tablet Take 1 tablet (25 mg total) by mouth at bedtime. 30 tablet 11   Multiple Vitamin (MULTIVITAMIN WITH MINERALS) TABS tablet Take 1  tablet by mouth daily.     OXYGEN Inhale into the lungs as needed.     potassium chloride SA (KLOR-CON M) 20 MEQ tablet Take 2 tablets (40 mEq total) by mouth daily. 60 tablet 5   Tiotropium Bromide Monohydrate (SPIRIVA RESPIMAT) 2.5 MCG/ACT AERS Inhale 2 puffs into the lungs daily. 4 g 11   torsemide (DEMADEX) 20 MG tablet Take 4 tablets (80 mg total) by mouth every morning. 120 tablet 5   Vitamin D, Cholecalciferol, 10 MCG (400 UNIT) CAPS Take 800 Units by mouth daily.     No current facility-administered medications for this encounter.   Allergies  Allergen Reactions   Iodinated Contrast Media Shortness Of Breath, Rash and Cough   Social History   Socioeconomic History   Marital status: Married    Spouse name: Not on file   Number of children: Not on file   Years of education: Not on file   Highest education level: Not on file  Occupational History   Not on file  Tobacco Use   Smoking status: Never   Smokeless tobacco: Never  Vaping Use   Vaping Use: Never used  Substance and Sexual Activity   Alcohol use: Never   Drug use: Never   Sexual activity: Not Currently  Other Topics Concern   Not on file  Social History Narrative   Not on file   Social Determinants of Health   Financial Resource Strain: Not on file  Food Insecurity: No Food Insecurity (04/21/2019)   Hunger Vital Sign    Worried About Running Out of Food in the Last Year: Never true    Ran Out of Food in the Last Year: Never true  Transportation Needs: No Transportation Needs (04/21/2019)   PRAPARE - Hydrologist (Medical): No    Lack of Transportation (Non-Medical): No  Physical Activity: Inactive (04/21/2019)   Exercise Vital Sign    Days of Exercise per Week: 0 days    Minutes of Exercise per Session: 0 min  Stress: No Stress Concern Present (04/21/2019)   [place]    Feeling of Stress : Only a little   Social Connections: Moderately Integrated (04/21/2019)   Social Connection and Isolation Panel [NHANES]    Frequency of Communication with Friends and Family: Three times a week    Frequency of Social Gatherings with Friends and Family: Once a week    Attends Religious Services: More than 4 times per year    Active Member of [employer] or Organizations: No    Attends Archivist Meetings: Never    Marital Status: Married  Human resources officer Violence: Not At Risk (04/21/2019)   Humiliation, Afraid, Rape, and Kick questionnaire    Fear of Current or Ex-Partner: No    Emotionally Abused: No    Physically Abused: No  Sexually Abused: No   Family History  Problem Relation Age of Onset   Heart attack Mother    Heart attack Father    BP 92/60   Pulse 86   Wt 88.9 kg (196 lb)   SpO2 95%   BMI 29.80 kg/m   Wt Readings from Last 3 Encounters:  02/11/22 88.9 kg (196 lb)  01/29/22 87.6 kg (193 lb 3.2 oz)  12/23/21 84 kg (185 lb 3 oz)   PHYSICAL EXAM: General:  Well appearing. No resp difficulty HEENT: normal Neck: supple. no JVD. Carotids 2+ bilat; no bruits. No lymphadenopathy or thryomegaly appreciated. Cor: PMI laterally displaced. Regular rate & rhythm. No rubs, gallops or murmurs. Lungs: clear Abdomen: soft, nontender, nondistended. No hepatosplenomegaly. No bruits or masses. Good bowel sounds. Extremities: no cyanosis, clubbing, rash, edema Neuro: alert & orientedx3, cranial nerves grossly intact. moves all 4 extremities w/o difficulty. 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  - Cath (1/23): non-obstructive CAD - s/p MDT ICD. - Chronic NYHA III. Volume looks ReDS 33% - With low BP/orthostasis will decrease torsemide 80 -> 60 mg daily + 40 KCl daily. Can take extra as needed - Continue Toprol XL 25 mg qhs - Continue Jardiance 12.5 mg bid. No GU symptoms. - Off  BiDil with low BP. - Etiology of CM remains unclear. On Zio he has frequent PVCs and PYP also appears positive. - Multiple myeloma panel & urine immunofixation negative. - Continue amio 200 daily to suppress  - Genetic testing for TTR amyloid negative - Repeat Echo at next visit to see if EF improving with PVC suppression - Although PYP scan is marginally positive, echo does not look like TTR and genetic testing is negative. Unable to get cMRI due to ICD - I worry he has advanced HF despite improvement in symptoms. Will check CPX. Can also consider Barostim vs CCM.   2. Paroxysmal atrial fibrillation - SR with PVCs on ECG today. - Continue Eliquis 5 mg bid. - Labs today   3. CKD stage 3b - Baseline SCr 1.7-1.9 - Labs today  4. OSA on CPAP  - Follows with Dr. Radford Pax.  5. CAD  - non obstructive on [facility] 1/23. - No s/s angina. - No ASA with Eliquis  - Continue atorvastatin.  6. Borderline DM2 - on Jardiance    Evan Bickers, MD  9:39 AM

## 2022-02-21 ENCOUNTER — Ambulatory Visit (HOSPITAL_COMMUNITY): Payer: No Typology Code available for payment source | Attending: Internal Medicine

## 2022-02-21 DIAGNOSIS — I5022 Chronic systolic (congestive) heart failure: Secondary | ICD-10-CM | POA: Diagnosis present

## 2022-03-12 ENCOUNTER — Telehealth: Payer: Self-pay | Admitting: *Deleted

## 2022-03-12 NOTE — Telephone Encounter (Signed)
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 Patient ask to have his office note mailed to his home address. Per patient office note has been mailed.

## 2022-04-14 ENCOUNTER — Emergency Department (HOSPITAL_COMMUNITY)
Admission: EM | Admit: 2022-04-14 | Discharge: 2022-04-14 | Discharge status: Against Medical Advice | Payer: No Typology Code available for payment source | Attending: Emergency Medicine | Admitting: Emergency Medicine

## 2022-04-14 ENCOUNTER — Emergency Department (HOSPITAL_COMMUNITY): Payer: No Typology Code available for payment source

## 2022-04-14 DIAGNOSIS — R0602 Shortness of breath: Secondary | ICD-10-CM | POA: Insufficient documentation

## 2022-04-14 DIAGNOSIS — R630 Anorexia: Secondary | ICD-10-CM | POA: Diagnosis not present

## 2022-04-14 DIAGNOSIS — R079 Chest pain, unspecified: Secondary | ICD-10-CM | POA: Diagnosis present

## 2022-04-14 DIAGNOSIS — R059 Cough, unspecified: Secondary | ICD-10-CM | POA: Diagnosis not present

## 2022-04-14 DIAGNOSIS — Z5321 Procedure and treatment not carried out due to patient leaving prior to being seen by health care provider: Secondary | ICD-10-CM | POA: Diagnosis not present

## 2022-04-14 LAB — CBC WITH DIFFERENTIAL/PLATELET
Abs Immature Granulocytes: 0.01 10*3/uL (ref 0.00–0.07)
Basophils Absolute: 0.1 10*3/uL (ref 0.0–0.1)
Basophils Relative: 1 %
Eosinophils Absolute: 0.1 10*3/uL (ref 0.0–0.5)
Eosinophils Relative: 1 %
HCT: 47.7 % (ref 39.0–52.0)
Hemoglobin: 15.5 g/dL (ref 13.0–17.0)
Immature Granulocytes: 0 %
Lymphocytes Relative: 37 %
Lymphs Abs: 2.4 10*3/uL (ref 0.7–4.0)
MCH: 27.7 pg (ref 26.0–34.0)
MCHC: 32.5 g/dL (ref 30.0–36.0)
MCV: 85.3 fL (ref 80.0–100.0)
Monocytes Absolute: 0.9 10*3/uL (ref 0.1–1.0)
Monocytes Relative: 14 %
Neutro Abs: 3.1 10*3/uL (ref 1.7–7.7)
Neutrophils Relative %: 47 %
Platelets: 181 10*3/uL (ref 150–400)
RBC: 5.59 MIL/uL (ref 4.22–5.81)
RDW: 18.4 % — ABNORMAL HIGH (ref 11.5–15.5)
WBC: 6.4 10*3/uL (ref 4.0–10.5)
nRBC: 0 % (ref 0.0–0.2)

## 2022-04-14 LAB — COMPREHENSIVE METABOLIC PANEL
ALT: 20 U/L (ref 0–44)
AST: 29 U/L (ref 15–41)
Albumin: 3.3 g/dL — ABNORMAL LOW (ref 3.5–5.0)
Alkaline Phosphatase: 116 U/L (ref 38–126)
Anion gap: 13 (ref 5–15)
BUN: 18 mg/dL (ref 8–23)
CO2: 24 mmol/L (ref 22–32)
Calcium: 9.3 mg/dL (ref 8.9–10.3)
Chloride: 102 mmol/L (ref 98–111)
Creatinine, Ser: 2.01 mg/dL — ABNORMAL HIGH (ref 0.61–1.24)
GFR, Estimated: 35 mL/min — ABNORMAL LOW (ref >60)
Glucose, Bld: 85 mg/dL (ref 70–99)
Potassium: 3.6 mmol/L (ref 3.5–5.1)
Sodium: 139 mmol/L (ref 135–145)
Total Bilirubin: 5.2 mg/dL — ABNORMAL HIGH (ref 0.3–1.2)
Total Protein: 6.6 g/dL (ref 6.5–8.1)

## 2022-04-14 LAB — BRAIN NATRIURETIC PEPTIDE: B Natriuretic Peptide: 1516.4 pg/mL — ABNORMAL HIGH (ref 0.0–100.0)

## 2022-04-14 LAB — TROPONIN I (HIGH SENSITIVITY): Troponin I (High Sensitivity): 29 ng/L — ABNORMAL HIGH (ref <18)

## 2022-04-14 NOTE — ED Notes (Signed)
Called x 3 no answer 

## 2022-04-14 NOTE — ED Triage Notes (Signed)
Pt sent from [facility] for eval of continued sob at rest and with exertion since having pneumonia three weeks ago and completing abx and using an inhaler. Pt continues to have productive cough, loss of appetite and sob.

## 2022-04-14 NOTE — ED Provider Triage Note (Signed)
Emergency Medicine Provider Triage Evaluation Note  Evan Serrano , a 71 y.o. male  was evaluated in triage.  Pt complains of chest pain and shortness of breath  Review of Systems  Positive: Shortness of breath.  [facility] sent [facility] for [facility] of chf. Negative: fever    Physical Exam  BP (!) 94/52   Pulse 78   Temp (!) 97.5 F (36.4 C) (Oral)   Resp 16   SpO2 94%  Gen:   Awake, no distress   Resp:  Normal effort  MSK:   Moves extremities without difficulty  Other:    Medical Decision Making  Medically screening exam initiated at 12:25 PM.  Appropriate orders placed.  Evan Serrano was informed that the remainder of the evaluation will be completed by another provider, this initial triage assessment does not replace that evaluation, and the importance of remaining in the ED until their evaluation is complete.     Elson Areas, [place] 04/14/22 1227

## 2022-04-16 ENCOUNTER — Emergency Department (HOSPITAL_COMMUNITY)
Admission: EM | Admit: 2022-04-16 | Discharge: 2022-04-16 | Discharge status: Against Medical Advice | Payer: No Typology Code available for payment source

## 2022-04-16 NOTE — ED Notes (Signed)
Pt handed stickers over and stated that he would go to a different hospital to be seen because he did not want to wait long

## 2022-05-05 ENCOUNTER — Telehealth (HOSPITAL_COMMUNITY): Payer: Self-pay | Admitting: *Deleted

## 2022-05-05 NOTE — Telephone Encounter (Signed)
Pt left vm stating he saw his pcp at the [place] because of recent weight loss and loss of appetite. Pcp ordered a colonoscopy but wants to make sure he is cleared by cardiology.   Routed to Dr.Bensimhon for advice

## 2022-05-06 NOTE — Telephone Encounter (Signed)
Pt aware and will contact his pcp 

## 2022-05-23 ENCOUNTER — Ambulatory Visit (HOSPITAL_BASED_OUTPATIENT_CLINIC_OR_DEPARTMENT_OTHER)
Admission: RE | Admit: 2022-05-23 | Discharge: 2022-05-23 | Disposition: A | Discharge status: Home / Self Care | Payer: Medicare PPO | Source: Ambulatory Visit | Attending: Internal Medicine | Admitting: Internal Medicine

## 2022-05-23 ENCOUNTER — Encounter (HOSPITAL_COMMUNITY): Payer: Self-pay | Admitting: Internal Medicine

## 2022-05-23 ENCOUNTER — Ambulatory Visit (HOSPITAL_COMMUNITY)
Admission: RE | Admit: 2022-05-23 | Discharge: 2022-05-23 | Disposition: A | Discharge status: Home / Self Care | Payer: Medicare PPO | Source: Ambulatory Visit | Attending: Internal Medicine | Admitting: Internal Medicine

## 2022-05-23 VITALS — BP 100/68 | HR 61 | Wt 180.0 lb

## 2022-05-23 DIAGNOSIS — G4733 Obstructive sleep apnea (adult) (pediatric): Secondary | ICD-10-CM | POA: Diagnosis not present

## 2022-05-23 DIAGNOSIS — Z95810 Presence of automatic (implantable) cardiac defibrillator: Secondary | ICD-10-CM | POA: Insufficient documentation

## 2022-05-23 DIAGNOSIS — E1122 Type 2 diabetes mellitus with diabetic chronic kidney disease: Secondary | ICD-10-CM | POA: Diagnosis not present

## 2022-05-23 DIAGNOSIS — I130 Hypertensive heart and chronic kidney disease with heart failure and stage 1 through stage 4 chronic kidney disease, or unspecified chronic kidney disease: Secondary | ICD-10-CM | POA: Insufficient documentation

## 2022-05-23 DIAGNOSIS — Z8616 Personal history of COVID-19: Secondary | ICD-10-CM | POA: Diagnosis not present

## 2022-05-23 DIAGNOSIS — I083 Combined rheumatic disorders of mitral, aortic and tricuspid valves: Secondary | ICD-10-CM | POA: Insufficient documentation

## 2022-05-23 DIAGNOSIS — I5022 Chronic systolic (congestive) heart failure: Secondary | ICD-10-CM

## 2022-05-23 DIAGNOSIS — N1832 Chronic kidney disease, stage 3b: Secondary | ICD-10-CM | POA: Insufficient documentation

## 2022-05-23 DIAGNOSIS — Z7901 Long term (current) use of anticoagulants: Secondary | ICD-10-CM | POA: Insufficient documentation

## 2022-05-23 DIAGNOSIS — Z8249 Family history of ischemic heart disease and other diseases of the circulatory system: Secondary | ICD-10-CM | POA: Diagnosis not present

## 2022-05-23 DIAGNOSIS — Z9981 Dependence on supplemental oxygen: Secondary | ICD-10-CM | POA: Diagnosis not present

## 2022-05-23 DIAGNOSIS — Z7984 Long term (current) use of oral hypoglycemic drugs: Secondary | ICD-10-CM | POA: Insufficient documentation

## 2022-05-23 DIAGNOSIS — I480 Paroxysmal atrial fibrillation: Secondary | ICD-10-CM | POA: Insufficient documentation

## 2022-05-23 DIAGNOSIS — N1831 Chronic kidney disease, stage 3a: Secondary | ICD-10-CM

## 2022-05-23 DIAGNOSIS — I5042 Chronic combined systolic (congestive) and diastolic (congestive) heart failure: Secondary | ICD-10-CM | POA: Diagnosis present

## 2022-05-23 DIAGNOSIS — I2510 Atherosclerotic heart disease of native coronary artery without angina pectoris: Secondary | ICD-10-CM | POA: Insufficient documentation

## 2022-05-23 DIAGNOSIS — Z79899 Other long term (current) drug therapy: Secondary | ICD-10-CM | POA: Diagnosis not present

## 2022-05-23 DIAGNOSIS — J9611 Chronic respiratory failure with hypoxia: Secondary | ICD-10-CM | POA: Insufficient documentation

## 2022-05-23 LAB — T4, FREE: Free T4: 1.24 ng/dL — ABNORMAL HIGH (ref 0.61–1.12)

## 2022-05-23 LAB — COMPREHENSIVE METABOLIC PANEL
ALT: 28 U/L (ref 0–44)
AST: 33 U/L (ref 15–41)
Albumin: 3.6 g/dL (ref 3.5–5.0)
Alkaline Phosphatase: 105 U/L (ref 38–126)
Anion gap: 10 (ref 5–15)
BUN: 22 mg/dL (ref 8–23)
CO2: 29 mmol/L (ref 22–32)
Calcium: 9.5 mg/dL (ref 8.9–10.3)
Chloride: 96 mmol/L — ABNORMAL LOW (ref 98–111)
Creatinine, Ser: 2.1 mg/dL — ABNORMAL HIGH (ref 0.61–1.24)
GFR, Estimated: 33 mL/min — ABNORMAL LOW (ref >60)
Glucose, Bld: 82 mg/dL (ref 70–99)
Potassium: 4.7 mmol/L (ref 3.5–5.1)
Sodium: 135 mmol/L (ref 135–145)
Total Bilirubin: 3.8 mg/dL — ABNORMAL HIGH (ref 0.3–1.2)
Total Protein: 7.8 g/dL (ref 6.5–8.1)

## 2022-05-23 LAB — ECHOCARDIOGRAM COMPLETE
AV Vena cont: 0.4 cm
Area-P 1/2: 5.38 cm2
Calc EF: 27 %
MV M vel: 3.73 m/s
MV Peak grad: 55.7 mmHg
P 1/2 time: 778 msec
Radius: 0.5 cm
S' Lateral: 6.9 cm
Single Plane A2C EF: 26 %
Single Plane A4C EF: 25.7 %

## 2022-05-23 LAB — BRAIN NATRIURETIC PEPTIDE: B Natriuretic Peptide: 1199.7 pg/mL — ABNORMAL HIGH (ref 0.0–100.0)

## 2022-05-23 LAB — TSH: TSH: 10.587 u[IU]/mL — ABNORMAL HIGH (ref 0.350–4.500)

## 2022-05-23 NOTE — Patient Instructions (Signed)
Medication Changes:  None, continue current medications  Lab Work:  Labs done today, your results will be available in MyChart, we will contact you for abnormal readings.   Testing/Procedures:  none  Referrals:  none  Special Instructions // Education:  Do the following things EVERYDAY: Weigh yourself in the morning before breakfast. Write it down and keep it in a log. Take your medicines as prescribed Eat low salt foods--Limit salt (sodium) to 2000 mg per day.  Stay as active as you can everyday Limit all fluids for the day to less than 2 liters   Follow-Up in: 1 month, bring your family to this appointment to discuss VAD (heart pump)  At the [facility], you and your health needs are our priority. We have a designated team specialized in the treatment of Heart Failure. This Care Team includes your primary Heart Failure Specialized Cardiologist (physician), Advanced Practice Providers (APPs- Physician Assistants and Nurse Practitioners), and Pharmacist who all work together to provide you with the care you need, when you need it.   You may see any of the following providers on your designated Care Team at your next follow up:  Dr. Arvilla Meres Dr. Marca Ancona Dr. Marcos Eke, NP Robbie Lis, [place] [facility] [place], [place] Brynda Peon, NP Karle Plumber, PharmD   Please be sure to bring in all your medications bottles to every appointment.   Need to Contact us:  If you have any questions or concerns before your next appointment please send [place] a message through [place] or call our office at [phone].    TO LEAVE A MESSAGE FOR THE NURSE SELECT OPTION 2, PLEASE LEAVE A MESSAGE INCLUDING: YOUR NAME DATE OF BIRTH CALL BACK NUMBER REASON FOR CALL**this is important as we prioritize the call backs  YOU WILL RECEIVE A CALL BACK THE SAME DAY AS LONG AS YOU CALL BEFORE 4:00 PM

## 2022-05-23 NOTE — Progress Notes (Signed)
Echocardiogram 2D Echocardiogram has been performed.  Evan Serrano 05/23/2022, 9:54 AM

## 2022-05-23 NOTE — Progress Notes (Signed)
ADVANCED HF CLINIC NOTE  Primary Care: [place]  HF Cardiologist: Dr. Haroldine Laws  HPI: Mr. Bonebrake is a 71 y.o. male with HFrEF (EF 20-25%, Echo 06/2021) 2/2 NICM with ICD placed 6759, HTN complicated by orthostatic hypotension, PAF (CHADSVASC 3) on chronic AC with eliquis, OSA on CPAP, chronic hypoxemic respiratory failure on 1L of O2 at home (since 06/2021) and CKD stage IIIa.   HFrEF [place] after patient had URI. He underwent left heart catheterization at that time and was told his coronary arteries were "clean".  On 12/26/222 had ICD (MDT) shock for an episode of ventricular fibrillation after failed ATP.   In 1/23 hospitalized with a/c HF. Echo EF 20-25%, severely dilated LV, G3 DD, severely dilated LA/RA, moderate MR and TR, mild AR. Not significantly changed from 12/2020 echo. LHC mild non obstructive CAD. ~50% mid and distal RCA. RHC: mean PAP 38, RA 9, PCWP 19  Zio 5/23 1. Sinus rhythm - avg HR of 77 bpm 2. 84 runs of non-sustained Ventricular Tachycardia -longest lasting 16 beats with an avg rate of 115 bpm. 3. Isolated PVCs were frequent (10.4%, 16384) There was one primary PVC morphology (10.4%)  PYP 5/23 mildly positive. SPEP negative. Genetic testing for TTR amyloid negative.   Echo today 05/23/22 EF < 20% RV moderately down severe MR  CPX 9/23: Peak VO2: 13.9 (58% predicted peak VO2) VE/VCO2 slope:  34 Peak RER: 1.14   Today he returns for HF follow up. Has had several ED visits due to SOB. Says he has poor appetite. Not eating well. Has lost 30 pounds this year. Says breathing ok some days and other days it is not. Very SOB with steps. SBP runs in 90s. Gets lightheaded when he stands. No tobacco. Compliant with meds. Lives with wife, 52 yo daughter and grandson. No edema, SOB , orthopnea or PND.   Cardiac Studies: - PYP (5/23): mildly positive  - R/LHC (1/23): mild non-obs CAD, 50% mid and distal RCA RA 9 PAP mean 38 PCWP 19  - Echo (1/23):  EF 20-25%  - Echo (8/22): EF 20-25%, severe LV dysfunction, grade III DD, mildly reduced RV, mild to moderate MR  Past Medical History:  Diagnosis Date   Cardiac pacemaker    COVID    Hypertension    Presence of cardiac defibrillator    Current Outpatient Medications  Medication Sig Dispense Refill   albuterol (VENTOLIN HFA) 108 (90 Base) MCG/ACT inhaler Inhale 2 puffs into the lungs every 6 (six) hours as needed for wheezing.     amiodarone (PACERONE) 200 MG tablet Take 1 tablet (200 mg total) by mouth daily. 90 tablet 3   apixaban (ELIQUIS) 5 MG TABS tablet Take 1 tablet (5 mg total) by mouth 2 (two) times daily. 60 tablet 3   atorvastatin (LIPITOR) 40 MG tablet Take 1 tablet (40 mg total) by mouth daily. 60 tablet 1   empagliflozin (JARDIANCE) 25 MG TABS tablet Take 12.5 mg by mouth daily.     Glucerna (GLUCERNA) LIQD Take 237 mLs by mouth daily in the afternoon.     loratadine (CLARITIN) 10 MG tablet Take 10 mg by mouth daily.     metoprolol succinate (TOPROL XL) 25 MG 24 hr tablet Take 1 tablet (25 mg total) by mouth at bedtime. 30 tablet 11   Multiple Vitamin (MULTIVITAMIN WITH MINERALS) TABS tablet Take 1 tablet by mouth daily.     OXYGEN Inhale into the lungs as needed.  potassium chloride SA (KLOR-CON M) 20 MEQ tablet Take 2 tablets (40 mEq total) by mouth daily. 60 tablet 5   Tiotropium Bromide Monohydrate (SPIRIVA RESPIMAT) 2.5 MCG/ACT AERS Inhale 2 puffs into the lungs daily. 4 g 11   torsemide (DEMADEX) 20 MG tablet Take 4 tablets (80 mg total) by mouth every morning. 120 tablet 5   Vitamin D, Cholecalciferol, 10 MCG (400 UNIT) CAPS Take 800 Units by mouth daily.     No current facility-administered medications for this encounter.   Allergies  Allergen Reactions   Iodinated Contrast Media Shortness Of Breath, Rash and Cough   Social History   Socioeconomic History   Marital status: Married    Spouse name: Not on file   Number of children: Not on file   Years of  education: Not on file   Highest education level: Not on file  Occupational History   Not on file  Tobacco Use   Smoking status: Never   Smokeless tobacco: Never  Vaping Use   Vaping Use: Never used  Substance and Sexual Activity   Alcohol use: Never   Drug use: Never   Sexual activity: Not Currently  Other Topics Concern   Not on file  Social History Narrative   Not on file   Social Determinants of Health   Financial Resource Strain: Not on file  Food Insecurity: No Food Insecurity (04/21/2019)   Hunger Vital Sign    Worried About Running Out of Food in the Last Year: Never true    Ran Out of Food in the Last Year: Never true  Transportation Needs: No Transportation Needs (04/21/2019)   PRAPARE - Hydrologist (Medical): No    Lack of Transportation (Non-Medical): No  Physical Activity: Inactive (04/21/2019)   Exercise Vital Sign    Days of Exercise per Week: 0 days    Minutes of Exercise per Session: 0 min  Stress: No Stress Concern Present (04/21/2019)   [place]    Feeling of Stress : Only a little  Social Connections: Moderately Integrated (04/21/2019)   Social Connection and Isolation Panel [NHANES]    Frequency of Communication with Friends and Family: Three times a week    Frequency of Social Gatherings with Friends and Family: Once a week    Attends Religious Services: More than 4 times per year    Active Member of [employer] or Organizations: No    Attends Archivist Meetings: Never    Marital Status: Married  Human resources officer Violence: Not At Risk (04/21/2019)   Humiliation, Afraid, Rape, and Kick questionnaire    Fear of Current or Ex-Partner: No    Emotionally Abused: No    Physically Abused: No    Sexually Abused: No   Family History  Problem Relation Age of Onset   Heart attack Mother    Heart attack Father    BP 100/68   Pulse 61   Wt 81.6 kg  (180 lb)   SpO2 94%   BMI 27.37 kg/m   Wt Readings from Last 3 Encounters:  05/23/22 81.6 kg (180 lb)  02/11/22 88.9 kg (196 lb)  01/29/22 87.6 kg (193 lb 3.2 oz)   PHYSICAL EXAM: General:  [place]esk appearing. No resp difficulty HEENT: normal Neck: supple. no JVD. Carotids 2+ bilat; no bruits. No lymphadenopathy or thryomegaly appreciated. Cor: PMI laterally displaced. Regular rate & rhythm. No rubs, gallops or murmurs. Lungs: clear Abdomen: soft,  nontender, nondistended. No hepatosplenomegaly. No bruits or masses. Good bowel sounds. Extremities: no cyanosis, clubbing, rash, edema Neuro: alert & orientedx3, cranial nerves grossly intact. moves all 4 extremities w/o difficulty. 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  - Cath (1/23): non-obstructive CAD - s/p MDT ICD. - Echo today 05/23/22 EF < 20% RV moderately down severe MR - CPX 9/23: Peak VO2: 13.9 (58% predicted peak VO2) VE/VCO2 slope:  34 Peak RER: 1.14  - Chronic NYHA III. Volume ok - Continue Toprol XL 25 mg qhs - Continue Jardiance 12.5 mg bid. No GU symptoms. - Off BiDil with low BP. - Etiology of CM remains unclear. On Zio he has frequent PVCs and PYP also appears positive. - Multiple myeloma panel & urine immunofixation negative. - Genetic testing for TTR amyloid negative - Continue amio 200 daily to suppress PVCs - Although PYP scan is marginally positive, echo does not look like TTR and genetic testing is negative. Unable to get cMRI due to ICD. EF has not improved with PVC suppression  - EF now worsening. CPX c/w with advanced/end-stageHF. Need to consider VAD if SCr will tolerate  - I will have patient meet with VAD coordinators today and arrange family meeting for next few weeks to discuss.  - Labs today  2. Paroxysmal atrial fibrillation - SR with PVCs on ECG today. - Continue Eliquis 5 mg bid.  3. CKD  stage 3b - Baseline SCr 1.7-1.9 - Labs today  4. OSA on CPAP  - Follows with Dr. Radford Pax.  5. CAD  - non obstructive on [facility] 1/23. - No s/s angina - No ASA with Eliquis  - Continue atorvastatin.  6. Borderline DM2 - on Jardiance    Glori Bickers, MD  11:07 AM

## 2022-05-26 LAB — T3, FREE: T3, Free: 2.7 pg/mL (ref 2.0–4.4)

## 2022-06-09 ENCOUNTER — Other Ambulatory Visit: Payer: Self-pay

## 2022-06-09 ENCOUNTER — Other Ambulatory Visit (HOSPITAL_COMMUNITY): Payer: Self-pay

## 2022-06-10 ENCOUNTER — Inpatient Hospital Stay (HOSPITAL_COMMUNITY)
Admission: RE | Admit: 2022-06-10 | Discharge: 2022-06-10 | Disposition: A | Discharge status: Home / Self Care | Payer: No Typology Code available for payment source | Source: Ambulatory Visit | Attending: Internal Medicine | Admitting: Internal Medicine

## 2022-06-10 ENCOUNTER — Other Ambulatory Visit (HOSPITAL_COMMUNITY): Payer: Self-pay | Admitting: Internal Medicine

## 2022-06-10 ENCOUNTER — Telehealth (HOSPITAL_COMMUNITY): Payer: Self-pay | Admitting: Licensed Clinical Social Worker

## 2022-06-10 ENCOUNTER — Encounter (HOSPITAL_COMMUNITY): Payer: Self-pay

## 2022-06-10 ENCOUNTER — Encounter (HOSPITAL_COMMUNITY): Payer: No Typology Code available for payment source

## 2022-06-10 ENCOUNTER — Ambulatory Visit (HOSPITAL_COMMUNITY)
Admission: RE | Admit: 2022-06-10 | Discharge: 2022-06-10 | Disposition: A | Discharge status: Home / Self Care | Payer: Medicare PPO | Source: Ambulatory Visit | Attending: Internal Medicine | Admitting: Internal Medicine

## 2022-06-10 VITALS — BP 106/60 | HR 41 | Wt 180.4 lb

## 2022-06-10 DIAGNOSIS — Z7984 Long term (current) use of oral hypoglycemic drugs: Secondary | ICD-10-CM | POA: Insufficient documentation

## 2022-06-10 DIAGNOSIS — I130 Hypertensive heart and chronic kidney disease with heart failure and stage 1 through stage 4 chronic kidney disease, or unspecified chronic kidney disease: Secondary | ICD-10-CM | POA: Diagnosis not present

## 2022-06-10 DIAGNOSIS — I2510 Atherosclerotic heart disease of native coronary artery without angina pectoris: Secondary | ICD-10-CM | POA: Diagnosis not present

## 2022-06-10 DIAGNOSIS — N1832 Chronic kidney disease, stage 3b: Secondary | ICD-10-CM | POA: Diagnosis not present

## 2022-06-10 DIAGNOSIS — R946 Abnormal results of thyroid function studies: Secondary | ICD-10-CM | POA: Diagnosis not present

## 2022-06-10 DIAGNOSIS — R008 Other abnormalities of heart beat: Secondary | ICD-10-CM | POA: Diagnosis not present

## 2022-06-10 DIAGNOSIS — I5084 End stage heart failure: Secondary | ICD-10-CM | POA: Diagnosis not present

## 2022-06-10 DIAGNOSIS — Z8616 Personal history of COVID-19: Secondary | ICD-10-CM | POA: Diagnosis not present

## 2022-06-10 DIAGNOSIS — I5042 Chronic combined systolic (congestive) and diastolic (congestive) heart failure: Secondary | ICD-10-CM | POA: Diagnosis present

## 2022-06-10 DIAGNOSIS — Z9981 Dependence on supplemental oxygen: Secondary | ICD-10-CM | POA: Insufficient documentation

## 2022-06-10 DIAGNOSIS — Z7901 Long term (current) use of anticoagulants: Secondary | ICD-10-CM | POA: Diagnosis not present

## 2022-06-10 DIAGNOSIS — N1831 Chronic kidney disease, stage 3a: Secondary | ICD-10-CM

## 2022-06-10 DIAGNOSIS — Z79899 Other long term (current) drug therapy: Secondary | ICD-10-CM | POA: Diagnosis not present

## 2022-06-10 DIAGNOSIS — I480 Paroxysmal atrial fibrillation: Secondary | ICD-10-CM | POA: Insufficient documentation

## 2022-06-10 DIAGNOSIS — Z01818 Encounter for other preprocedural examination: Secondary | ICD-10-CM

## 2022-06-10 DIAGNOSIS — G4733 Obstructive sleep apnea (adult) (pediatric): Secondary | ICD-10-CM | POA: Insufficient documentation

## 2022-06-10 DIAGNOSIS — Z7951 Long term (current) use of inhaled steroids: Secondary | ICD-10-CM | POA: Diagnosis not present

## 2022-06-10 DIAGNOSIS — I509 Heart failure, unspecified: Secondary | ICD-10-CM

## 2022-06-10 DIAGNOSIS — Z95810 Presence of automatic (implantable) cardiac defibrillator: Secondary | ICD-10-CM | POA: Insufficient documentation

## 2022-06-10 DIAGNOSIS — I428 Other cardiomyopathies: Secondary | ICD-10-CM | POA: Diagnosis not present

## 2022-06-10 DIAGNOSIS — E1122 Type 2 diabetes mellitus with diabetic chronic kidney disease: Secondary | ICD-10-CM | POA: Diagnosis not present

## 2022-06-10 DIAGNOSIS — I5022 Chronic systolic (congestive) heart failure: Secondary | ICD-10-CM

## 2022-06-10 DIAGNOSIS — I493 Ventricular premature depolarization: Secondary | ICD-10-CM

## 2022-06-10 DIAGNOSIS — J9611 Chronic respiratory failure with hypoxia: Secondary | ICD-10-CM | POA: Insufficient documentation

## 2022-06-10 HISTORY — DX: Heart failure, unspecified: I50.9

## 2022-06-10 HISTORY — DX: Obstructive sleep apnea (adult) (pediatric): G47.33

## 2022-06-10 HISTORY — DX: Chronic respiratory failure with hypoxia: J96.11

## 2022-06-10 HISTORY — DX: Paroxysmal atrial fibrillation: I48.0

## 2022-06-10 HISTORY — DX: Other cardiomyopathies: I42.8

## 2022-06-10 HISTORY — DX: Chronic kidney disease, stage 3 unspecified: N18.30

## 2022-06-10 LAB — URINALYSIS, ROUTINE W REFLEX MICROSCOPIC
Bilirubin Urine: NEGATIVE
Glucose, UA: >=500 mg/dL — AB
Hgb urine dipstick: NEGATIVE
Ketones, ur: NEGATIVE mg/dL
Nitrite: NEGATIVE
Protein, ur: NEGATIVE mg/dL
Specific Gravity, Urine: 1.01 (ref 1.005–1.030)
pH: 5 (ref 5.0–8.0)

## 2022-06-10 LAB — COMPREHENSIVE METABOLIC PANEL
ALT: 38 U/L (ref 0–44)
AST: 34 U/L (ref 15–41)
Albumin: 3.3 g/dL — ABNORMAL LOW (ref 3.5–5.0)
Alkaline Phosphatase: 103 U/L (ref 38–126)
Anion gap: 10 (ref 5–15)
BUN: 22 mg/dL (ref 8–23)
CO2: 26 mmol/L (ref 22–32)
Calcium: 9.3 mg/dL (ref 8.9–10.3)
Chloride: 101 mmol/L (ref 98–111)
Creatinine, Ser: 1.99 mg/dL — ABNORMAL HIGH (ref 0.61–1.24)
GFR, Estimated: 35 mL/min — ABNORMAL LOW (ref >60)
Glucose, Bld: 81 mg/dL (ref 70–99)
Potassium: 4 mmol/L (ref 3.5–5.1)
Sodium: 137 mmol/L (ref 135–145)
Total Bilirubin: 2.5 mg/dL — ABNORMAL HIGH (ref 0.3–1.2)
Total Protein: 7 g/dL (ref 6.5–8.1)

## 2022-06-10 LAB — HEMOGLOBIN A1C
Hgb A1c MFr Bld: 6.2 % — ABNORMAL HIGH (ref 4.8–5.6)
Mean Plasma Glucose: 131.24 mg/dL

## 2022-06-10 LAB — CBC WITH DIFFERENTIAL/PLATELET
Abs Immature Granulocytes: 0.02 10*3/uL (ref 0.00–0.07)
Basophils Absolute: 0 10*3/uL (ref 0.0–0.1)
Basophils Relative: 1 %
Eosinophils Absolute: 0.2 10*3/uL (ref 0.0–0.5)
Eosinophils Relative: 3 %
HCT: 47.4 % (ref 39.0–52.0)
Hemoglobin: 16.1 g/dL (ref 13.0–17.0)
Immature Granulocytes: 0 %
Lymphocytes Relative: 41 %
Lymphs Abs: 2 10*3/uL (ref 0.7–4.0)
MCH: 28.2 pg (ref 26.0–34.0)
MCHC: 34 g/dL (ref 30.0–36.0)
MCV: 83 fL (ref 80.0–100.0)
Monocytes Absolute: 0.7 10*3/uL (ref 0.1–1.0)
Monocytes Relative: 13 %
Neutro Abs: 2.1 10*3/uL (ref 1.7–7.7)
Neutrophils Relative %: 42 %
Platelets: 154 10*3/uL (ref 150–400)
RBC: 5.71 MIL/uL (ref 4.22–5.81)
RDW: 19.1 % — ABNORMAL HIGH (ref 11.5–15.5)
WBC: 5 10*3/uL (ref 4.0–10.5)
nRBC: 0 % (ref 0.0–0.2)

## 2022-06-10 LAB — PROTIME-INR
INR: 1.6 — ABNORMAL HIGH (ref 0.8–1.2)
Prothrombin Time: 18.5 seconds — ABNORMAL HIGH (ref 11.4–15.2)

## 2022-06-10 LAB — HEPATITIS C ANTIBODY: HCV Ab: NONREACTIVE

## 2022-06-10 LAB — LIPID PANEL
Cholesterol: 165 mg/dL (ref 0–200)
HDL: 30 mg/dL — ABNORMAL LOW (ref >40)
LDL Cholesterol: 119 mg/dL — ABNORMAL HIGH (ref 0–99)
Total CHOL/HDL Ratio: 5.5 RATIO
Triglycerides: 78 mg/dL (ref <150)
VLDL: 16 mg/dL (ref 0–40)

## 2022-06-10 LAB — RAPID URINE DRUG SCREEN, HOSP PERFORMED
Amphetamines: NOT DETECTED
Barbiturates: NOT DETECTED
Benzodiazepines: NOT DETECTED
Cocaine: NOT DETECTED
Opiates: NOT DETECTED
Tetrahydrocannabinol: NOT DETECTED

## 2022-06-10 LAB — HEPATITIS B CORE ANTIBODY, TOTAL: Hep B Core Total Ab: NONREACTIVE

## 2022-06-10 LAB — URIC ACID: Uric Acid, Serum: 7.8 mg/dL (ref 3.7–8.6)

## 2022-06-10 LAB — APTT: aPTT: 35 seconds (ref 24–36)

## 2022-06-10 LAB — PREALBUMIN: Prealbumin: 14 mg/dL — ABNORMAL LOW (ref 18–38)

## 2022-06-10 LAB — ABO/RH: ABO/RH(D): A POS

## 2022-06-10 LAB — LACTATE DEHYDROGENASE: LDH: 174 U/L (ref 98–192)

## 2022-06-10 LAB — BRAIN NATRIURETIC PEPTIDE: B Natriuretic Peptide: 1878.7 pg/mL — ABNORMAL HIGH (ref 0.0–100.0)

## 2022-06-10 LAB — HEPATITIS B SURFACE ANTIGEN: Hepatitis B Surface Ag: NONREACTIVE

## 2022-06-10 LAB — T4, FREE: Free T4: 1.26 ng/dL — ABNORMAL HIGH (ref 0.61–1.12)

## 2022-06-10 LAB — HEPATITIS B SURFACE ANTIBODY,QUALITATIVE: Hep B S Ab: NONREACTIVE

## 2022-06-10 LAB — MAGNESIUM: Magnesium: 2.8 mg/dL — ABNORMAL HIGH (ref 1.7–2.4)

## 2022-06-10 LAB — PSA: Prostatic Specific Antigen: 1.7 ng/mL (ref 0.00–4.00)

## 2022-06-10 LAB — TSH: TSH: 14.738 u[IU]/mL — ABNORMAL HIGH (ref 0.350–4.500)

## 2022-06-10 LAB — HIV ANTIBODY (ROUTINE TESTING W REFLEX): HIV Screen 4th Generation wRfx: NONREACTIVE

## 2022-06-10 LAB — ANTITHROMBIN III: AntiThromb III Func: 83 % (ref 75–120)

## 2022-06-10 MED ORDER — EMPAGLIFLOZIN 25 MG PO TABS: 25.0000 mg | ORAL_TABLET | Freq: Every day | ORAL | 6 refills | Status: DC

## 2022-06-10 NOTE — Telephone Encounter (Signed)
CSW received referral to complete LVAD Assessment. CSW contacted patient and message left for return call to confirm meeting on 06-24-22 at 9:30 am to complete assessment. Raquel Sarna, [place], [place]

## 2022-06-10 NOTE — Patient Instructions (Addendum)
Stop Metoprolol  Wear your Zio patch for 14 days. Return patch in mail at end of 14 days.  Return to clinic in 2 weeks for follow up with Dr Haroldine Laws Call [phone] VAD coordinator office if you have any questions/concerns regarding LVAD (left ventricular assist device) discussion

## 2022-06-10 NOTE — Patient Instructions (Signed)
Stop Metoprolol  Return to VAD clinic in 2 weeks for follow up with Dr Haroldine Laws Return Zio patch via mail after 14 days

## 2022-06-10 NOTE — Progress Notes (Signed)
Patient presents for VAD consult in [facility] today.   Pt presented ambulatory using cane today with his wife. Denies dizziness, falls, and signs of bleeding. Reports constant lightheadedness since Saturday. Reports shortness of breath that is worse at time than his baseline. He has been using 3 L home O2 and Albuterol inhaler regularly. States both of these help. Sat 99% on room air today, and does not appear to be in distress. Reports pulmonary f/u appt scheduled in 2 weeks.   Reports poor appetite since this summer. Endorses 20 lb weight loss. States he is nauseated by the smell of food most days. When he does eat, reports early satiety. States he has not been drinking Glucerna.   Pt in bigeminy on the monitor rate 40s. When in SR rate 60s-70s. Per Dr Gala Romney will place Zio XT 14 days monitor to assess PVC burden. Pt to return patch via mail after 14 days. Per Dr Gala Romney will stop Metoprolol today. Will continue Amiodarone 200 mg daily.   Pt states he is willing to do "whatever it takes" to improve his health and live. He and Zenon Mayo have been together 47 years, and he wants to live for her and his family. Pt owns his home. Home is 1 story, with 4 steps to get inside. His bedroom has 3 prong outlets. Pt states he has not smoked or drank ETOH since the 1970s when he was in the [employer]. Reports he retired in 2020 when his heart failure worsened. His wife is currently working full time. VAD evaluation discussion as documented below.   Vital Signs:  HR: 65 SR (rate in 40s when in bigeminy) BP: 106/60 (71) SPO2: 99% on RA   Weight: 180.4 lb  Last weight: 180 lb  Symptom YES NO DETAILS  Angina  x Activity:  Claudication  x How Far:  Syncope  x When:  Stroke  x   Orthopnea   How many pillows:  PND   How often:  CPAP x  How many hours: waiting for new machine  Pedal Edema  x   Abdominal Fullness x  Poor appetite and early satiety    Nausea / Vomit x  Daily nausea  Diaphoresis  x When:   Shortness of Breath x  Activity: has been worse since Saturday. Has been using Albuterol inhaler and home O2 3 L. States this helps  Palpitations x  When: frequent PVCs/ bigeminy. Zio XT 14 day monitor placed.  ICD shock  x   Bleeding S/S  x   Tea-colored Urine  x   Hospitalizations  x   Emergency Room  x   Other MD     Activity Reports minimal activity since Saturday due to weakness  Fluid   Diet regular   Device: [employer] Therapies: on Last check: at [facility]  MCS EDUCATION NOTE:                VAD evaluation consent reviewed and signed by Lyn Hollingshead. Initial VAD teaching completed with pt and caregiver. Pt's wife Zenon Mayo present for initial teaching. States she needs time to consider if she is able to fulfill caregiver role prior to signing evaluation consent as she is overwhelmed with conversation at this time.  VAD educational packet including "Understanding Your Options with Advanced Heart Failure", "[facility] Patient Agreement for VAD Evaluation and Potential Implantation" consent, and [employer] "Heartmate 3 Left Ventricular Device (LVAD) Patient Guide", "[place] HM III Patient Education", "[employer] Circulatory Support Program", and "Decision Aids  for Left Ventricular Assist Device" reviewed in detail and left at bedside for continued reference.   All questions answered regarding VAD implant, hospital stay, and what to expect when discharged home living with a heart pump. Pt identified Sheena as his primary caregiver  if this therapy should be deemed appropriate for destination therapy due to age.  Explained need for 24/7 care when pt is discharged home due to sternal precautions, adaptation to living on support, emotional support, consistent and meticulous exit site care and management, medication adherence and high volume of follow up visits with the VAD Clinic after discharge; both pt and caregiver verbalized understanding of above.   Explained that LVAD can be implanted  for two indications in the setting of advanced left ventricular heart failure treatment:  Bridge to transplant - used for patients who cannot safely wait for heart transplant without this device.  Or    Destination therapy - used for patients until end of life or recovery of heart function.  Patient and caregiver(s) acknowledge that the indication at this point in time for LVAD therapy would be for destination therapy due to age.   Provided brief equipment overview and demonstration with HeartMate III training loop including discussion on the following:   a) mobile power unit b) system controller   c) universal Magazine features editor   d) battery clips   e) Batteries   f)  Perc lock   g) Percutaneous lead   Reviewed and supplied a copy of home inspection check list stressing that only three pronged grounded power outlets can be used for VAD equipment. Pt confirmed home has electrical outlets that will support the equipment along with access working telephone. (Both pt and wife have working cell phones.) Reports home is 1 story with 4 steps into the house.   Identified the following lifestyle modifications while living on MCS:    1. No driving for at least three months and then only if doctor gives permission to do so.   2. No tub baths while pump implanted, and shower only when doctor gives permission.   3. No swimming or submersion in water while implanted with pump.   4. No contact sports or engaging in jumping activities.   5. Always have a backup controller, charged spare batteries, and battery clips nearby at all times in case of emergency.   6. Call the doctor or hospital contact person if any change in how the pump sounds, feels, or works.   7. Plan to sleep only when connected to the power module.   8. Do not sleep on your stomach.   9. Keep a backup system controller, charged batteries, battery clips, and flashlight near you during sleep in case of electrical power outage.   10.  Exit site care including dressing changes, monitoring for infection, and importance of keeping percutaneous lead stabilized at all times.     Extended the option to have one of our current patients and caregiver(s) come to talk with them about living on support to assist with decision making. Will plan to have current VAD pt speak with them at follow up visit.   Reviewed pictures of VAD drive line, site care, dressing changes, and drive line stabilization including securement attachment device and abdominal binder. Discussed with pt and family that they will be required to purchase dressing supplies as long as patient has the VAD in place.   He will also need to abide by sternal precautions with no lifting >10lbs, pushing, pulling  and will need assistance with adapting to new life style with VAD equipment and care.   Intermacs patient survival statistics through June 2023 reviewed with patient and caregiver as follows:                                             The patient understands that from this discussion it does not mean that they will receive the device, but that depends on an extensive evaluation process. The patient is aware of the fact that if at anytime they want to stop the evaluation process they can.  All questions have been answered at this time and contact information was provided should they encounter any further questions. They are both agreeable at this time to the evaluation process and will move forward. Vita Barley states she needs time to think about caregiver role to decide if she will be able to successfully fulfill requirements. She does work full time. Made aware we can fill out FMLA paperwork if needed. She states she will make a decision by the patient's follow up appt scheduled in two weeks. They will plan to bring their daughter to follow up appt to discuss possible backup caregiver role.   Patient Instructions:  Stop Metoprolol  Return to VAD clinic in 2 weeks for  follow up with Dr Haroldine Laws Return Zio patch via mail after 14 days  Emerson Monte RN [place] Coordinator  Office: [phone]  24/7 Pager: [phone]

## 2022-06-11 ENCOUNTER — Other Ambulatory Visit (HOSPITAL_COMMUNITY): Payer: Self-pay | Admitting: *Deleted

## 2022-06-11 ENCOUNTER — Telehealth: Payer: Self-pay

## 2022-06-11 ENCOUNTER — Telehealth (HOSPITAL_COMMUNITY): Payer: Self-pay | Admitting: *Deleted

## 2022-06-11 DIAGNOSIS — I5022 Chronic systolic (congestive) heart failure: Secondary | ICD-10-CM

## 2022-06-11 DIAGNOSIS — I509 Heart failure, unspecified: Secondary | ICD-10-CM

## 2022-06-11 DIAGNOSIS — Z79899 Other long term (current) drug therapy: Secondary | ICD-10-CM

## 2022-06-11 DIAGNOSIS — Z01818 Encounter for other preprocedural examination: Secondary | ICD-10-CM

## 2022-06-11 LAB — T3, FREE: T3, Free: 2.1 pg/mL (ref 2.0–4.4)

## 2022-06-11 NOTE — Progress Notes (Signed)
ADVANCED HF CLINIC NOTE  Primary Care: [place]  HF Cardiologist: Dr. Haroldine Laws  HPI: [name] is a 72 y.o. male with HFrEF (EF 20-25%, Echo 06/2021) 2/2 NICM with ICD placed 6644, HTN complicated by orthostatic hypotension, PAF (CHADSVASC 3) on chronic AC with eliquis, OSA on CPAP, chronic hypoxemic respiratory failure on 1L of O2 at home (since 06/2021) and CKD stage IIIa.   HFrEF [place] after patient had URI. He underwent left heart catheterization at that time and was told his coronary arteries were "clean".  On 12/26/222 had ICD (MDT) shock for an episode of ventricular fibrillation after failed ATP.   In 1/23 hospitalized with a/c HF. Echo EF 20-25%, severely dilated LV, G3 DD, severely dilated LA/RA, moderate MR and TR, mild AR. Not significantly changed from 12/2020 echo. LHC mild non obstructive CAD. ~50% mid and distal RCA. RHC: mean PAP 38, RA 9, PCWP 19 Fick 4.3/2.0  Zio 5/23 1. Sinus rhythm - avg HR of 77 bpm 2. 84 runs of non-sustained Ventricular Tachycardia -longest lasting 16 beats with an avg rate of 115 bpm. 3. Isolated PVCs were frequent (10.4%, 03474) There was one primary PVC morphology (10.4%)  PYP 5/23 mildly positive. SPEP negative. Genetic testing for TTR amyloid negative.   Echo today 05/23/22 EF < 20% RV moderately down severe MR  CPX 9/23: Peak VO2: 13.9 (58% predicted peak VO2) VE/VCO2 slope:  34 Peak RER: 1.14   Today he returns for HF follow up with his wife to talk about possible advanced HF therapies. Remains very weak. Struggles with ADLs due to SOB and fatigue. Edema controlled. Compliant with meds. Walks with cane. On 3L O2 by [place]. Appetite poor. Has lost 20 pounds in past 6 months,   Cardiac Studies: - PYP (5/23): mildly positive  - R/LHC (1/23): mild non-obs CAD, 50% mid and distal RCA RA 9 PAP mean 38 PCWP 19  - Echo (1/23): EF 20-25%  - Echo (8/22): EF 20-25%, severe LV dysfunction, grade III DD, mildly reduced  RV, mild to moderate MR  Past Medical History:  Diagnosis Date   Cardiac pacemaker    CHF (congestive heart failure) ([place])    diagnosed in 2011   Chronic hypoxemic respiratory failure (HCC)    CKD (chronic kidney disease) stage 3, GFR 30-59 ml/min (HCC)    3a   COVID    Hypertension    NICM (nonischemic cardiomyopathy) (HCC)    OSA on CPAP    PAF (paroxysmal atrial fibrillation) (HCC)    on Eliquis   Presence of cardiac defibrillator    Current Outpatient Medications  Medication Sig Dispense Refill   albuterol (VENTOLIN HFA) 108 (90 Base) MCG/ACT inhaler Inhale 2 puffs into the lungs every 6 (six) hours as needed for wheezing.     amiodarone (PACERONE) 200 MG tablet Take 1 tablet (200 mg total) by mouth daily. 90 tablet 3   apixaban (ELIQUIS) 5 MG TABS tablet Take 1 tablet (5 mg total) by mouth 2 (two) times daily. 60 tablet 3   atorvastatin (LIPITOR) 40 MG tablet Take 1 tablet (40 mg total) by mouth daily. 60 tablet 1   loratadine (CLARITIN) 10 MG tablet Take 10 mg by mouth daily.     Multiple Vitamin (MULTIVITAMIN WITH MINERALS) TABS tablet Take 1 tablet by mouth daily.     OXYGEN Inhale into the lungs as needed.     potassium chloride SA (KLOR-CON M) 20 MEQ tablet Take 2 tablets (40 mEq  total) by mouth daily. 60 tablet 5   torsemide (DEMADEX) 20 MG tablet Take 4 tablets (80 mg total) by mouth every morning. 120 tablet 5   Vitamin D, Cholecalciferol, 10 MCG (400 UNIT) CAPS Take 800 Units by mouth daily.     empagliflozin (JARDIANCE) 25 MG TABS tablet Take 1 tablet (25 mg total) by mouth daily. Take 12.5 mg (1/2 tablet) daily. 30 tablet 6   Glucerna (GLUCERNA) LIQD Take 237 mLs by mouth daily in the afternoon. (Patient not taking: Reported on 06/10/2022)     Tiotropium Bromide Monohydrate (SPIRIVA RESPIMAT) 2.5 MCG/ACT AERS Inhale 2 puffs into the lungs daily. (Patient not taking: Reported on 06/10/2022) 4 g 11   No current facility-administered medications for this encounter.    Allergies  Allergen Reactions   Iodinated Contrast Media Shortness Of Breath, Rash and Cough   Social History   Socioeconomic History   Marital status: Married    Spouse name: Not on file   Number of children: Not on file   Years of education: Not on file   Highest education level: Not on file  Occupational History   Not on file  Tobacco Use   Smoking status: Never   Smokeless tobacco: Never  Vaping Use   Vaping Use: Never used  Substance and Sexual Activity   Alcohol use: Never   Drug use: Never   Sexual activity: Not Currently  Other Topics Concern   Not on file  Social History Narrative   Not on file   Social Determinants of Health   Financial Resource Strain: Not on file  Food Insecurity: No Food Insecurity (04/21/2019)   Hunger Vital Sign    Worried About Running Out of Food in the Last Year: Never true    Ran Out of Food in the Last Year: Never true  Transportation Needs: No Transportation Needs (04/21/2019)   PRAPARE - Hydrologist (Medical): No    Lack of Transportation (Non-Medical): No  Physical Activity: Inactive (04/21/2019)   Exercise Vital Sign    Days of Exercise per Week: 0 days    Minutes of Exercise per Session: 0 min  Stress: No Stress Concern Present (04/21/2019)   [place]    Feeling of Stress : Only a little  Social Connections: Moderately Integrated (04/21/2019)   Social Connection and Isolation Panel [NHANES]    Frequency of Communication with Friends and Family: Three times a week    Frequency of Social Gatherings with Friends and Family: Once a week    Attends Religious Services: More than 4 times per year    Active Member of [employer] or Organizations: No    Attends Archivist Meetings: Never    Marital Status: Married  Human resources officer Violence: Not At Risk (04/21/2019)   Humiliation, Afraid, Rape, and Kick questionnaire    Fear  of Current or Ex-Partner: No    Emotionally Abused: No    Physically Abused: No    Sexually Abused: No   Family History  Problem Relation Age of Onset   Heart attack Mother    Heart attack Father    BP 106/60 Comment: map 71  Pulse (!) 41   Wt 81.8 kg (180 lb 6.4 oz)   SpO2 99% Comment: on RA  BMI 27.43 kg/m   Wt Readings from Last 3 Encounters:  06/10/22 81.8 kg (180 lb 6.4 oz)  05/23/22 81.6 kg (180 lb)  02/11/22  88.9 kg (196 lb)   PHYSICAL EXAM: General:  Wesk appearing. No resp difficulty HEENT: normal Neck: supple. no JVD. Carotids 2+ bilat; no bruits. No lymphadenopathy or thryomegaly appreciated. Cor: Irregular rate & rhythm. No rubs, gallops or murmurs. Lungs: clear Abdomen: soft, nontender, nondistended. No hepatosplenomegaly. No bruits or masses. Good bowel sounds. Extremities: no cyanosis, clubbing, rash, edema Neuro: alert & orientedx3, cranial nerves grossly intact. moves all 4 extremities w/o difficulty. Affect pleasant  On monitor: Sinus 60-70s with frequent PVCs in bigeminy pattern   ASSESSMENT & PLAN:   1) Chronic systolic and diastolic heart failure due to NICM  - Dates back to 2011 - Echo (1/23): EF 20-25%.  - Cath (1/23): non-obstructive CAD - s/p MDT ICD. - Echo 05/23/22 EF < 20% RV moderately down severe MR - CPX 9/23: Peak VO2: 13.9 (58% predicted peak VO2) VE/VCO2 slope:  34 Peak RER: 1.14  - Chronic NYHA IIIB-IV. Volume status ok  - Will stop Toprol with low output symptoms  - Continue Jardiance 12.5 mg bid. No GU symptoms. - Off BiDil with low BP. - Etiology of CM remains unclear. On Zio he has frequent PVCs and PYP also appears positive. - Multiple myeloma panel & urine immunofixation negative. - Genetic testing for TTR amyloid negative - Continue amio 200 daily to suppress PVCs - Although PYP scan is marginally positive, echo does not look like TTR and genetic testing is negative. Unable to get cMRI due to ICD. EF has not improved with  attempts at PVC suppression  - EF now worsening. CPX c/w with advanced/end-stage HF.  - We had long discussion about his situation and whether or not he would be candidate for VAD. He says he is willing to consider but wants to talk to family more. VAD team will work with him and his family. Plan to discuss at [facility].  - I worry PVCs are contributing to CM. Continue amio. Repeat zio to quantify  2. Paroxysmal atrial fibrillation - SR with PVCs on ECG today. - On amio - Continue Eliquis 5 mg bid.  3. CKD stage 3b - Baseline SCr 1.7-1.9 - Repeat labs today  4. OSA on CPAP  - Follows with Dr. Mayford Knife.  5. CAD  - non obstructive on [facility] 1/23. - No s/s angina - No ASA with Eliquis  - Continue atorvastatin.  6. 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 Frequent PVCs - bigeminy on monitor today despite amio - repeat zio   8. Elevated TSH and T4 - likely due to [facility] - repeat in 4 weeks.  - refer to Endocrinology   Total time spent 45 minutes. Over half that time spent discussing above.    Arvilla Meres, MD  1:44 PM

## 2022-06-11 NOTE — Progress Notes (Signed)
TSH- 14.738  T3- 2.1  T4- 1.26 on labs from 06/10/22. Pt is on Amiodarone 200 mg daily for PAF. Per Dr Haroldine Laws will refer pt to endocrinology for follow up.   Emerson Monte RN [place] Coordinator  Office: [phone]  24/7 Pager: [phone]

## 2022-06-11 NOTE — Telephone Encounter (Signed)
Ebony Hail from [facility] wanted to make sure that patient was called and given information for the appointment on 06/26/22. She states that when she called she was told that patient couldn't be seen until October so she schedule at another clinic.   CB: [phone]

## 2022-06-11 NOTE — Telephone Encounter (Addendum)
TSH- 14.738 T3- 2.1 T4- 1.26 on labs from 06/10/22. Pt is on Amiodarone 200 mg daily for PAF. Per Dr Haroldine Laws will refer pt to endocrinology for follow up.   Referral sent to [facility]. New patient appt scheduled 07/11/22 at 2:00 pm. Office address: [id number], [place], [place] [address]. Office #: [phone].   Called and spoke with patient regarding the above appt information. All questions answered, and he verbalized understanding of appt.   Emerson Monte RN [place] Coordinator  Office: [phone]  24/7 Pager: [phone]

## 2022-06-12 ENCOUNTER — Other Ambulatory Visit (HOSPITAL_COMMUNITY): Payer: Self-pay

## 2022-06-12 ENCOUNTER — Institutional Professional Consult (permissible substitution) (INDEPENDENT_AMBULATORY_CARE_PROVIDER_SITE_OTHER): Payer: Medicare PPO | Admitting: Cardiothoracic Surgery

## 2022-06-12 ENCOUNTER — Other Ambulatory Visit (HOSPITAL_COMMUNITY): Payer: Self-pay | Admitting: Internal Medicine

## 2022-06-12 ENCOUNTER — Encounter: Payer: Self-pay | Admitting: Cardiothoracic Surgery

## 2022-06-12 VITALS — BP 94/69 | HR 67 | Resp 20 | Ht 68.0 in | Wt 176.0 lb

## 2022-06-12 DIAGNOSIS — I509 Heart failure, unspecified: Secondary | ICD-10-CM | POA: Diagnosis not present

## 2022-06-12 DIAGNOSIS — I5022 Chronic systolic (congestive) heart failure: Secondary | ICD-10-CM

## 2022-06-12 LAB — LUPUS ANTICOAGULANT
DRVVT: 59.4 s — ABNORMAL HIGH (ref 0.0–47.0)
PTT Lupus Anticoagulant: 40 s (ref 0.0–43.5)
Thrombin Time: 20.9 s (ref 0.0–23.0)
dPT Confirm Ratio: 1.1 Ratio (ref 0.00–1.34)
dPT: 47.5 s (ref 0.0–47.6)

## 2022-06-12 LAB — DRVVT MIX: dRVVT Mix: 46.5 s — ABNORMAL HIGH (ref 0.0–40.4)

## 2022-06-12 LAB — DRVVT CONFIRM: dRVVT Confirm: 1 ratio (ref 0.8–1.2)

## 2022-06-12 NOTE — Progress Notes (Signed)
[address]       [place] [address]             [phone]                    [facility] Medical Record #[id number] Date of Birth: [birth year]  Referring: Dolores Patty, MD Primary Care: Dolores Patty, MD Primary Cardiologist: Chrystie Nose, MD  Chief Complaint:    Chief Complaint  Patient presents with   congestive heart failue    New patient consultation, LVAD     History of Present Illness:    Evan Serrano 72 y.o. male who is referred for evaluation of LVAD. He has an EF ar 20-25%. Also AF on eliquis. CKD stage III as well.   Per the chart it looks concerning regarding his status using a cane and requring 1L of 02 at home.   He looks significantly better in person - he can walk 1 block with a cane. He didn't have the cane at all in the office - had forgotten it. Didn't seem to have issues in the office. Doesn't use a walker. Lives with wife and daugher at home  Lost 40 lb this year, lost 40 lb this year.   Very nausea when he smells food Never happened before   No prior surgery on chest except defibrillator.   His peak V02 is 13.9   He does report struggling at time with ADLs.    Per full cardiology HPI  Evan Serrano is a 72 y.o. male with HFrEF (EF 20-25%, Echo 06/2021) 2/2 NICM with ICD placed 2018, HTN complicated by orthostatic hypotension, PAF (CHADSVASC 3) on chronic AC with eliquis, OSA on CPAP, chronic hypoxemic respiratory failure on 1L of O2 at home (since 06/2021) and CKD stage IIIa.    HFrEF [place] after patient had URI. He underwent left heart catheterization at that time and was told his coronary arteries were "clean".   On 12/26/222 had ICD (MDT) shock for an episode of ventricular fibrillation after failed ATP.    In 1/23 hospitalized with a/c HF. Echo EF 20-25%, severely dilated LV, G3 DD, severely dilated LA/RA, moderate MR and TR, mild AR. Not significantly changed from  12/2020 echo. LHC mild non obstructive CAD. ~50% mid and distal RCA. RHC: mean PAP 38, RA 9, PCWP 19 Fick 4.3/2.0   Zio 5/23 1. Sinus rhythm - avg HR of 77 bpm 2. 84 runs of non-sustained Ventricular Tachycardia -longest lasting 16 beats with an avg rate of 115 bpm. 3. Isolated PVCs were frequent (10.4%, 50093) There was one primary PVC morphology (10.4%)   PYP 5/23 mildly positive. SPEP negative. Genetic testing for TTR amyloid negative.    Echo today 05/23/22 EF < 20% RV moderately down severe MR   CPX 9/23: Peak VO2: 13.9 (58% predicted peak VO2) VE/VCO2 slope:  34 Peak RER: 1.14    Today he returns for HF follow up with his wife to talk about possible advanced HF therapies. Remains very weak. Struggles with ADLs due to SOB and fatigue. Edema controlled. Compliant with meds. Walks with cane. On 3L O2 by [place]. Appetite poor. Has lost 20 pounds in past 6 months,     Cardiac Studies: - PYP (5/23): mildly positive   - R/LHC (1/23): mild non-obs CAD, 50% mid and distal RCA RA 9 PAP mean 38 PCWP 19   - Echo (1/23): EF 20-25%   -  Echo (8/22): EF 20-25%, severe LV dysfunction, grade III DD, mildly reduced RV, mild to moderate MR       Past Medical History:  Diagnosis Date   Cardiac pacemaker     CHF (congestive heart failure) ([place])      diagnosed in 2011   Chronic hypoxemic respiratory failure (HCC)     CKD (chronic kidney disease) stage 3, GFR 30-59 ml/min (HCC)      3a   COVID     Hypertension     NICM (nonischemic cardiomyopathy) (HCC)     OSA on CPAP     PAF (paroxysmal atrial fibrillation) ([place])      on Eliquis   Presence of cardiac defibrillator   "  Past Medical History:  Diagnosis Date   Cardiac pacemaker    CHF (congestive heart failure) ([place])    diagnosed in 2011   Chronic hypoxemic respiratory failure (HCC)    CKD (chronic kidney disease) stage 3, GFR 30-59 ml/min (HCC)    3a   COVID    Hypertension    NICM (nonischemic cardiomyopathy) (HCC)    OSA on CPAP     PAF (paroxysmal atrial fibrillation) ([place])    on Eliquis   Presence of cardiac defibrillator     Past Surgical History:  Procedure Laterality Date   PACEMAKER IMPLANT     RIGHT/LEFT HEART CATH AND CORONARY ANGIOGRAPHY N/A 06/14/2021   Procedure: RIGHT/LEFT HEART CATH AND CORONARY ANGIOGRAPHY;  Surgeon: Sherren Mocha, MD;  Location: [facility];  Service: Cardiovascular;  Laterality: N/A;    Family History  Problem Relation Age of Onset   Heart attack Mother    Heart attack Father      Social History   Tobacco Use  Smoking Status Never  Smokeless Tobacco Never    Social History   Substance and Sexual Activity  Alcohol Use Never     Allergies  Allergen Reactions   Iodinated Contrast Media Shortness Of Breath, Rash and Cough    Current Outpatient Medications  Medication Sig Dispense Refill   albuterol (VENTOLIN HFA) 108 (90 Base) MCG/ACT inhaler Inhale 2 puffs into the lungs every 6 (six) hours as needed for wheezing.     amiodarone (PACERONE) 200 MG tablet Take 1 tablet (200 mg total) by mouth daily. 90 tablet 3   apixaban (ELIQUIS) 5 MG TABS tablet Take 1 tablet (5 mg total) by mouth 2 (two) times daily. 60 tablet 3   atorvastatin (LIPITOR) 40 MG tablet Take 1 tablet (40 mg total) by mouth daily. 60 tablet 1   empagliflozin (JARDIANCE) 25 MG TABS tablet Take 1 tablet (25 mg total) by mouth daily. Take 12.5 mg (1/2 tablet) daily. 30 tablet 6   Glucerna (GLUCERNA) LIQD Take 237 mLs by mouth daily in the afternoon.     loratadine (CLARITIN) 10 MG tablet Take 10 mg by mouth daily.     Multiple Vitamin (MULTIVITAMIN WITH MINERALS) TABS tablet Take 1 tablet by mouth daily.     OXYGEN Inhale into the lungs as needed.     potassium chloride SA (KLOR-CON M) 20 MEQ tablet Take 2 tablets (40 mEq total) by mouth daily. 60 tablet 5   Tiotropium Bromide Monohydrate (SPIRIVA RESPIMAT) 2.5 MCG/ACT AERS Inhale 2 puffs into the lungs daily. 4 g 11   torsemide (DEMADEX) 20 MG  tablet Take 4 tablets (80 mg total) by mouth every morning. 120 tablet 5   Vitamin D, Cholecalciferol, 10 MCG (400 UNIT) CAPS Take 800  Units by mouth daily.     No current facility-administered medications for this visit.    ROS 14 point ROS reviewed and negative except as per HPI   PHYSICAL EXAMINATION: BP 94/69 (BP Location: Left Arm, Patient Position: Sitting, Cuff Size: Normal)   Pulse 67   Resp 20   Ht 5\' 8"  (1.727 m)   Wt 176 lb (79.8 kg)   SpO2 98% Comment: RA  BMI 26.76 kg/m   Gen: NAD Neuro: Alert and oriented CV: irregular, + murmur Resp: Nonlaboured Abd: Soft, ntnd Extr: WWP  Diagnostic Studies & Laboratory data:     Recent Radiology Findings:   ECHOCARDIOGRAM COMPLETE  Result Date: 05/23/2022    ECHOCARDIOGRAM REPORT   Patient Name:   Evan Serrano Date of Exam: 05/23/2022 Medical Rec #:  [id number]        Height:       68.0 in Accession #:    [id number]       Weight:       196.0 lb Date of Birth:  Oct 13, 1950       BSA:          2.026 m Patient Age:    72 years         BP:           98/62 mmHg Patient Gender: M                HR:           72 bpm. Exam Location:  Outpatient Procedure: 2D Echo, 3D Echo, Cardiac Doppler, Color Doppler and Strain Analysis Indications:    Congestive Heart Failure I50.9  History:        Patient has prior history of Echocardiogram examinations, most                 recent 06/13/2021. Pacemaker; Risk Factors:Hypertension.  Sonographer:    Bernadene Person RDCS Referring Phys: [id number] Corry Ihnen R BENSIMHON IMPRESSIONS  1. Left ventricular ejection fraction, by estimation, is <20%. The left ventricle has severely decreased function. The left ventricle demonstrates global hypokinesis. Left ventricular diastolic parameters are consistent with Grade III diastolic dysfunction (restrictive).  2. Right ventricular systolic function is moderately reduced. The right ventricular size is normal. There is moderately elevated pulmonary artery systolic pressure.  3.  Left atrial size was severely dilated.  4. Right atrial size was severely dilated.  5. The mitral valve is normal in structure. Severe mitral valve regurgitation. No evidence of mitral stenosis.  6. Tricuspid valve regurgitation is moderate to severe.  7. The aortic valve is tricuspid. There is mild calcification of the aortic valve. Aortic valve regurgitation is mild. No aortic stenosis is present. Aortic regurgitation PHT measures 778 msec.  8. Aortic dilatation noted. There is borderline dilatation of the ascending aorta, measuring 39 mm.  9. The inferior vena cava is normal in size with greater than 50% respiratory variability, suggesting right atrial pressure of 3 mmHg. FINDINGS  Left Ventricle: Left ventricular ejection fraction, by estimation, is <20%. The left ventricle has severely decreased function. The left ventricle demonstrates global hypokinesis. The left ventricular internal cavity size was normal in size. There is no  left ventricular hypertrophy. Left ventricular diastolic parameters are consistent with Grade III diastolic dysfunction (restrictive). Right Ventricle: The right ventricular size is normal. No increase in right ventricular wall thickness. Right ventricular systolic function is moderately reduced. There is moderately elevated pulmonary artery systolic pressure. The tricuspid regurgitant velocity is 3.32 m/s, and with  an assumed right atrial pressure of 3 mmHg, the estimated right ventricular systolic pressure is 123XX123 mmHg. Left Atrium: Left atrial size was severely dilated. Right Atrium: Right atrial size was severely dilated. Pericardium: There is no evidence of pericardial effusion. Mitral Valve: The mitral valve is normal in structure. Severe mitral valve regurgitation. No evidence of mitral valve stenosis. Tricuspid Valve: The tricuspid valve is normal in structure. Tricuspid valve regurgitation is moderate to severe. No evidence of tricuspid stenosis. Aortic Valve: The aortic valve  is tricuspid. There is mild calcification of the aortic valve. Aortic valve regurgitation is mild. Aortic regurgitation PHT measures 778 msec. No aortic stenosis is present. Pulmonic Valve: The pulmonic valve was normal in structure. Pulmonic valve regurgitation is not visualized. No evidence of pulmonic stenosis. Aorta: The aortic root is normal in size and structure and aortic dilatation noted. There is borderline dilatation of the ascending aorta, measuring 39 mm. Venous: The inferior vena cava is normal in size with greater than 50% respiratory variability, suggesting right atrial pressure of 3 mmHg. IAS/Shunts: No atrial level shunt detected by color flow Doppler. Additional Comments: A device lead is visualized.  LEFT VENTRICLE PLAX 2D LVIDd:         7.40 cm      Diastology LVIDs:         6.90 cm      LV e' medial:    3.55 cm/s LV PW:         0.80 cm      LV E/e' medial:  25.8 LV IVS:        0.80 cm      LV e' lateral:   13.70 cm/s LVOT diam:     2.00 cm      LV E/e' lateral: 6.7 LV SV:         41 LV SV Index:   20 LVOT Area:     3.14 cm                              3D Volume EF: LV Volumes (MOD)            3D EF:        19 % LV vol d, MOD A2C: 339.0 ml LV EDV:       389 ml LV vol d, MOD A4C: 370.0 ml LV ESV:       314 ml LV vol s, MOD A2C: 251.0 ml LV SV:        74 ml LV vol s, MOD A4C: 275.0 ml LV SV MOD A2C:     88.0 ml LV SV MOD A4C:     370.0 ml LV SV MOD BP:      97.3 ml RIGHT VENTRICLE RV S prime:     6.80 cm/s TAPSE (M-mode): 2.2 cm LEFT ATRIUM              Index        RIGHT ATRIUM           Index LA diam:        5.50 cm  2.71 cm/m   RA Area:     36.80 cm LA Vol (A2C):   103.0 ml 50.83 ml/m  RA Volume:   149.00 ml 73.53 ml/m LA Vol (A4C):   95.0 ml  46.88 ml/m LA Biplane Vol: 101.0 ml 49.85 ml/m  AORTIC VALVE LVOT Vmax:  85.40 cm/s LVOT Vmean:        62.000 cm/s LVOT VTI:          0.130 m AI PHT:            778 msec AR Vena Contracta: 0.40 cm  AORTA Ao Root diam: 3.40 cm Ao Asc diam:   3.90 cm MITRAL VALVE                  TRICUSPID VALVE MV Area (PHT): 5.38 cm       TR Peak grad:   44.1 mmHg MV Decel Time: 141 msec       TR Vmax:        332.00 cm/s MR Peak grad:    55.7 mmHg MR Mean grad:    33.5 mmHg    SHUNTS MR Vmax:         373.00 cm/s  Systemic VTI:  0.13 m MR Vmean:        269.5 cm/s   Systemic Diam: 2.00 cm MR PISA:         1.57 cm MR PISA Eff ROA: 17 mm MR PISA Radius:  0.50 cm MV E velocity: 91.50 cm/s MV A velocity: 29.60 cm/s MV E/A ratio:  3.09 Glori Bickers MD Electronically signed by Glori Bickers MD Signature Date/Time: 05/23/2022/10:06:25 AM    Final        I have independently reviewed the above radiology studies  and reviewed the findings with the patient.   Recent Lab Findings: Lab Results  Component Value Date   WBC 5.0 06/10/2022   HGB 16.1 06/10/2022   HCT 47.4 06/10/2022   PLT 154 06/10/2022   GLUCOSE 81 06/10/2022   CHOL 165 06/10/2022   TRIG 78 06/10/2022   HDL 30 (L) 06/10/2022   LDLCALC 119 (H) 06/10/2022   ALT 38 06/10/2022   AST 34 06/10/2022   NA 137 06/10/2022   K 4.0 06/10/2022   CL 101 06/10/2022   CREATININE 1.99 (H) 06/10/2022   BUN 22 06/10/2022   CO2 26 06/10/2022   TSH 14.738 (H) 06/10/2022   INR 1.6 (H) 06/10/2022   HGBA1C 6.2 (H) 06/10/2022   Procedures: CARDIAC CATHETERIZATION  Height: 5\' 8"  (1.727 m)  Weight: 218 lb 0.6 oz (98.9 kg)  Blood Pressure: 128/81  Heart Rate: 85   Accession Number: [id number]  Date of Study: 06/14/21  Ordering Provider: Sherren Mocha, MD  Clinical Indications: Acute on chronic systolic heart failure ([place]) [Y86.57 (ICD-10-CM)]        MyChart Results Release  MyChart Status: Pending  Results Release   Physicians  Panel Physicians Referring Physician Case Authorizing Physician  Sherren Mocha, MD (Primary)     Procedures  RIGHT/LEFT HEART CATH AND CORONARY ANGIOGRAPHY   Conclusion      Dist RCA lesion is 50% stenosed.   Mid RCA lesion is 50% stenosed.   1.   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  Patent left main, LAD, and left circumflex, with minimal irregularity in each vessel but no significant stenosis 3.  Mildly elevated right heart pressures with mean pulmonary artery pressure 38, RA pressure of 9, pulmonary capillary wedge pressure 19 mmHg.   Recommend: Patient appears to have severe nonischemic cardiomyopathy, continue heart failure treatments per primary team   Assessment / Plan:   72 year old man with NICM presents for evaluation for LVAD candidacy. LVEF is less than 20%. RV is moderately reduced. There  is mod-severe TR. There is mild AI. There is severe MR.   He has been on medical therapy and has an LVEF <25% along with demonstrated functional limitation with a V02 max <14ml/kg/min.   He is a reasonable candidate for LVAD implantation.   I would advise proceeding soon before he deteriorates further. I'm holding Feb 14th as an operative date pending the rest of his workup.   He may need a park stitch for the aortic valve if his AI becomes moderate when we go on pump. I would probably not repair his TV as his RV is already moderately reduced. This may improve as his overall volume status normalizes as he gets out of heart failure after VAD implant in the medium term. I would also clip his left atrial appendage given history of atrial fib.    Risks/benefits/alternatives were discussed at length (92% straightforward recovery, 6% morbidity [any organ, predominantly pulmonary, 2% mortality]. Straightforward recovery typically entails 3-5 ICU days, 7-10 days on the floor, seeing me back in clinic at 1 week and 1 month postoperatively. No lifting greater than 30 lbs for 6 weeks. Total recovery expected by 2 months. All questions were asked and answered.   Followup: CT scans PFTs Would recommend he meet with nutrition is possible as well given his aversion to food.   Pierre Bali  Ciearra Rufo 06/15/2022 2:50 PM

## 2022-06-13 NOTE — Telephone Encounter (Signed)
Left  a vm for Ebony Hail letting her know that patient is aware of appointment

## 2022-06-16 LAB — FACTOR 5 LEIDEN

## 2022-06-18 ENCOUNTER — Other Ambulatory Visit (HOSPITAL_COMMUNITY): Payer: Self-pay | Admitting: *Deleted

## 2022-06-18 DIAGNOSIS — I509 Heart failure, unspecified: Secondary | ICD-10-CM

## 2022-06-18 DIAGNOSIS — I5022 Chronic systolic (congestive) heart failure: Secondary | ICD-10-CM

## 2022-06-18 MED ORDER — EMPAGLIFLOZIN 25 MG PO TABS: ORAL_TABLET | ORAL | 3 refills | Status: DC

## 2022-06-23 ENCOUNTER — Encounter (HOSPITAL_COMMUNITY): Payer: Self-pay

## 2022-06-23 ENCOUNTER — Other Ambulatory Visit (HOSPITAL_COMMUNITY): Payer: Self-pay

## 2022-06-23 DIAGNOSIS — I509 Heart failure, unspecified: Secondary | ICD-10-CM

## 2022-06-23 DIAGNOSIS — Z01818 Encounter for other preprocedural examination: Secondary | ICD-10-CM

## 2022-06-23 NOTE — Progress Notes (Signed)
ADVANCED HF CLINIC NOTE  Primary Care: [place]  HF Cardiologist: Dr. Gala Romney  HPI: Evan Serrano is a 72 y.o. male with HFrEF (EF 20-25%, Echo 06/2021) 2/2 NICM with ICD placed 2018, HTN complicated by orthostatic hypotension, PAF (CHADSVASC 3) on chronic AC with eliquis, OSA on CPAP, chronic hypoxemic respiratory failure on 1L of O2 at home (since 06/2021) and CKD stage IIIa.   [place] had [place]. He underwent left heart catheterization at that time and was told his coronary arteries were "clean".  On 12/26/222 had ICD (MDT) shock for an episode of ventricular fibrillation after failed ATP.   In 1/23 hospitalized with a/c HF. Echo EF 20-25%, severely dilated LV, G3 DD, severely dilated LA/RA, moderate MR and TR, mild AR. Not significantly changed from 12/2020 echo. LHC mild non obstructive CAD. ~50% mid and distal RCA. RHC: mean PAP 38, RA 9, PCWP 19 Fick 4.3/2.0  Zio 5/23 1. Sinus rhythm - avg HR of 77 bpm 2. 84 runs of non-sustained Ventricular Tachycardia -longest lasting 16 beats with an avg rate of 115 bpm. 3. Isolated PVCs were frequent (10.4%, 27035) There was one primary PVC morphology (10.4%)  PYP 5/23 mildly positive. SPEP negative. Genetic testing for TTR amyloid negative.   Echo today 05/23/22 EF < 20% RV moderately down severe MR  CPX 9/23: Peak VO2: 13.9 (58% predicted peak VO2) VE/VCO2 slope:  34 Peak RER: 1.14   We saw him 2 weeks ago and discussed possible VAD which he was interested in. Has been seen by Dr. Delia Chimes and also presented at [facility].   Here for routine f/u. Continues with NYHA IIIB-IV symptoms with dyspnea on minimal exertion. No CP. Edema well controlled. Compliant with meds. No ICD shocks   Cardiac Studies: - PYP (5/23): mildly positive  - R/LHC (1/23): mild non-obs CAD, 50% mid and distal RCA RA 9 PAP mean 38 PCWP 19  - Echo (1/23): EF 20-25%  - Echo (8/22): EF 20-25%, severe LV dysfunction, grade III DD,  mildly reduced RV, mild to moderate MR  Past Medical History:  Diagnosis Date   Cardiac pacemaker    CHF (congestive heart failure) (HCC)    diagnosed in 2011   Chronic hypoxemic respiratory failure (HCC)    CKD (chronic kidney disease) stage 3, GFR 30-59 ml/min (HCC)    3a   COVID    Hypertension    NICM (nonischemic cardiomyopathy) (HCC)    OSA on CPAP    PAF (paroxysmal atrial fibrillation) (HCC)    on Eliquis   Presence of cardiac defibrillator    Current Outpatient Medications  Medication Sig Dispense Refill   albuterol (VENTOLIN HFA) 108 (90 Base) MCG/ACT inhaler Inhale 2 puffs into the lungs every 6 (six) hours as needed for wheezing.     amiodarone (PACERONE) 200 MG tablet Take 1 tablet (200 mg total) by mouth daily. 90 tablet 3   apixaban (ELIQUIS) 5 MG TABS tablet Take 1 tablet (5 mg total) by mouth 2 (two) times daily. 60 tablet 3   atorvastatin (LIPITOR) 40 MG tablet Take 1 tablet (40 mg total) by mouth daily. 60 tablet 1   loratadine (CLARITIN) 10 MG tablet Take 10 mg by mouth daily.     Multiple Vitamin (MULTIVITAMIN WITH MINERALS) TABS tablet Take 1 tablet by mouth daily.     omeprazole (PRILOSEC) 40 MG capsule Take 40 mg by mouth in the morning and at bedtime.     OXYGEN Inhale  into the lungs as needed.     potassium chloride SA (KLOR-CON M) 20 MEQ tablet Take 2 tablets (40 mEq total) by mouth daily. 60 tablet 5   torsemide (DEMADEX) 20 MG tablet Take 4 tablets (80 mg total) by mouth every morning. 120 tablet 5   Vitamin D, Cholecalciferol, 10 MCG (400 UNIT) CAPS Take 800 Units by mouth daily.     empagliflozin (JARDIANCE) 25 MG TABS tablet Take 12.5 mg (1/2 tablet) daily. 45 tablet 3   Glucerna (GLUCERNA) LIQD Take 237 mLs by mouth daily in the afternoon. (Patient not taking: Reported on 06/24/2022)     Tiotropium Bromide Monohydrate (SPIRIVA RESPIMAT) 2.5 MCG/ACT AERS Inhale 2 puffs into the lungs daily. (Patient not taking: Reported on 06/24/2022) 4 g 11   No  current facility-administered medications for this encounter.   Allergies  Allergen Reactions   Iodinated Contrast Media Shortness Of Breath, Rash and Cough   Social History   Socioeconomic History   Marital status: Married    Spouse name: Not on file   Number of children: Not on file   Years of education: Not on file   Highest education level: Not on file  Occupational History   Not on file  Tobacco Use   Smoking status: Never   Smokeless tobacco: Never  Vaping Use   Vaping Use: Never used  Substance and Sexual Activity   Alcohol use: Never   Drug use: Never   Sexual activity: Not Currently  Other Topics Concern   Not on file  Social History Narrative   Not on file   Social Determinants of Health   Financial Resource Strain: Not on file  Food Insecurity: No Food Insecurity (04/21/2019)   Hunger Vital Sign    Worried About Running Out of Food in the Last Year: Never true    Ran Out of Food in the Last Year: Never true  Transportation Needs: No Transportation Needs (04/21/2019)   PRAPARE - Hydrologist (Medical): No    Lack of Transportation (Non-Medical): No  Physical Activity: Inactive (04/21/2019)   Exercise Vital Sign    Days of Exercise per Week: 0 days    Minutes of Exercise per Session: 0 min  Stress: No Stress Concern Present (04/21/2019)   [place]    Feeling of Stress : Only a little  Social Connections: Moderately Integrated (04/21/2019)   Social Connection and Isolation Panel [NHANES]    Frequency of Communication with Friends and Family: Three times a week    Frequency of Social Gatherings with Friends and Family: Once a week    Attends Religious Services: More than 4 times per year    Active Member of [employer] or Organizations: No    Attends Archivist Meetings: Never    Marital Status: Married  Human resources officer Violence: Not At Risk (04/21/2019)    Humiliation, Afraid, Rape, and Kick questionnaire    Fear of Current or Ex-Partner: No    Emotionally Abused: No    Physically Abused: No    Sexually Abused: No   Family History  Problem Relation Age of Onset   Heart attack Mother    Heart attack Father    BP (!) 115/56 Comment: map 84  Pulse 77   Wt 82.5 kg (181 lb 12.8 oz)   SpO2 99% Comment: on RA  BMI 27.64 kg/m   Wt Readings from Last 3 Encounters:  06/24/22 82.5  kg (181 lb 12.8 oz)  06/12/22 79.8 kg (176 lb)  06/10/22 81.8 kg (180 lb 6.4 oz)   PHYSICAL EXAM: General:  No resp difficulty HEENT: normal Neck: supple. no JVD. Carotids 2+ bilat; no bruits. No lymphadenopathy or thryomegaly appreciated. Cor: PMI nondisplaced. Regular rate & rhythm. 2/6 MR Lungs: clear Abdomen: soft, nontender, nondistended. No hepatosplenomegaly. No bruits or masses. Good bowel sounds. Extremities: no cyanosis, clubbing, rash, edema Neuro: alert & orientedx3, cranial nerves grossly intact. moves all 4 extremities w/o difficulty. 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  - Cath (1/23): non-obstructive CAD - s/p MDT ICD. - Echo 05/23/22 EF < 20% RV moderately down severe MR - CPX 9/23: Peak VO2: 13.9 (58% predicted peak VO2) VE/VCO2 slope:  34 Peak RER: 1.14  - Chronic NYHA IIIB-IV. Volume status ok  - Off b-blocker due to low output - Continue Jardiance 12.5 mg bid. No GU symptoms. - Off BiDil with low BP. - Etiology of CM remains unclear. On Zio he has frequent PVCs and PYP also appears positive. - Multiple myeloma panel & urine immunofixation negative. - Genetic testing for TTR amyloid negative - Continue amio 200 daily to suppress PVCs - Although PYP scan is marginally positive, echo does not look like TTR and genetic testing is negative. Unable to get cMRI due to ICD. EF has not improved with attempts at PVC suppression  -  EF now worsening. CPX c/w with advanced/end-stage HF.  - Has been seen by Dr. Tenny Craw and also presented at [facility]. Now has [facility] approval to proceed with VAD w/u. Will order studies. I will see back in 2-3 weeks to review. If studies ok will repeat R/L HC and present back to [facility] for VAD approval  - I worry PVCs are contributing to CM. Continue amio. Repeat zio results pending  2. Paroxysmal atrial fibrillation - SR with PVCs on ECG today. - On amio - Continue Eliquis 5 mg bid.  3. CKD stage 3b - Baseline SCr 1.7-1.9 - Repeat labs today  4. OSA on CPAP  - Follows with Dr. Radford Pax.  5. CAD  - non obstructive on [facility] 1/23. - No s/s angina - No ASA with Eliquis  - Continue atorvastatin.  6. 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 Frequent PVCs - bigeminy on monitor today despite amio - repeat zio in process  8. Elevated TSH and T4 - likely due to [facility] - repeat in 4 weeks.  - refer to Endocrinology    Evan Bickers, MD  3:27 PM

## 2022-06-24 ENCOUNTER — Encounter (HOSPITAL_COMMUNITY): Payer: Self-pay | Admitting: Internal Medicine

## 2022-06-24 ENCOUNTER — Ambulatory Visit (HOSPITAL_COMMUNITY)
Admission: RE | Admit: 2022-06-24 | Discharge: 2022-06-24 | Disposition: A | Discharge status: Home / Self Care | Payer: No Typology Code available for payment source | Source: Ambulatory Visit | Attending: Internal Medicine | Admitting: Internal Medicine

## 2022-06-24 ENCOUNTER — Encounter (HOSPITAL_COMMUNITY): Payer: No Typology Code available for payment source

## 2022-06-24 ENCOUNTER — Encounter (HOSPITAL_COMMUNITY): Payer: No Typology Code available for payment source | Admitting: Internal Medicine

## 2022-06-24 VITALS — BP 115/56 | HR 77 | Wt 181.8 lb

## 2022-06-24 DIAGNOSIS — I493 Ventricular premature depolarization: Secondary | ICD-10-CM | POA: Insufficient documentation

## 2022-06-24 DIAGNOSIS — I130 Hypertensive heart and chronic kidney disease with heart failure and stage 1 through stage 4 chronic kidney disease, or unspecified chronic kidney disease: Secondary | ICD-10-CM | POA: Insufficient documentation

## 2022-06-24 DIAGNOSIS — I5042 Chronic combined systolic (congestive) and diastolic (congestive) heart failure: Secondary | ICD-10-CM | POA: Diagnosis present

## 2022-06-24 DIAGNOSIS — Z8616 Personal history of COVID-19: Secondary | ICD-10-CM | POA: Insufficient documentation

## 2022-06-24 DIAGNOSIS — G4733 Obstructive sleep apnea (adult) (pediatric): Secondary | ICD-10-CM | POA: Diagnosis not present

## 2022-06-24 DIAGNOSIS — Z7901 Long term (current) use of anticoagulants: Secondary | ICD-10-CM | POA: Insufficient documentation

## 2022-06-24 DIAGNOSIS — I5022 Chronic systolic (congestive) heart failure: Secondary | ICD-10-CM

## 2022-06-24 DIAGNOSIS — I509 Heart failure, unspecified: Secondary | ICD-10-CM | POA: Diagnosis not present

## 2022-06-24 DIAGNOSIS — R946 Abnormal results of thyroid function studies: Secondary | ICD-10-CM | POA: Insufficient documentation

## 2022-06-24 DIAGNOSIS — R7303 Prediabetes: Secondary | ICD-10-CM | POA: Insufficient documentation

## 2022-06-24 DIAGNOSIS — I428 Other cardiomyopathies: Secondary | ICD-10-CM | POA: Diagnosis not present

## 2022-06-24 DIAGNOSIS — N183 Chronic kidney disease, stage 3 unspecified: Secondary | ICD-10-CM | POA: Diagnosis not present

## 2022-06-24 DIAGNOSIS — Z8249 Family history of ischemic heart disease and other diseases of the circulatory system: Secondary | ICD-10-CM | POA: Insufficient documentation

## 2022-06-24 DIAGNOSIS — Z95810 Presence of automatic (implantable) cardiac defibrillator: Secondary | ICD-10-CM | POA: Insufficient documentation

## 2022-06-24 DIAGNOSIS — N1832 Chronic kidney disease, stage 3b: Secondary | ICD-10-CM | POA: Diagnosis not present

## 2022-06-24 DIAGNOSIS — Z7984 Long term (current) use of oral hypoglycemic drugs: Secondary | ICD-10-CM | POA: Insufficient documentation

## 2022-06-24 DIAGNOSIS — Z79899 Other long term (current) drug therapy: Secondary | ICD-10-CM | POA: Diagnosis not present

## 2022-06-24 DIAGNOSIS — R008 Other abnormalities of heart beat: Secondary | ICD-10-CM | POA: Insufficient documentation

## 2022-06-24 DIAGNOSIS — I2510 Atherosclerotic heart disease of native coronary artery without angina pectoris: Secondary | ICD-10-CM | POA: Insufficient documentation

## 2022-06-24 DIAGNOSIS — I480 Paroxysmal atrial fibrillation: Secondary | ICD-10-CM | POA: Insufficient documentation

## 2022-06-24 DIAGNOSIS — Z01818 Encounter for other preprocedural examination: Secondary | ICD-10-CM

## 2022-06-24 LAB — BASIC METABOLIC PANEL
Anion gap: 8 (ref 5–15)
BUN: 16 mg/dL (ref 8–23)
CO2: 31 mmol/L (ref 22–32)
Calcium: 9.5 mg/dL (ref 8.9–10.3)
Chloride: 100 mmol/L (ref 98–111)
Creatinine, Ser: 2 mg/dL — ABNORMAL HIGH (ref 0.61–1.24)
GFR, Estimated: 35 mL/min — ABNORMAL LOW (ref >60)
Glucose, Bld: 76 mg/dL (ref 70–99)
Potassium: 3.9 mmol/L (ref 3.5–5.1)
Sodium: 139 mmol/L (ref 135–145)

## 2022-06-24 LAB — BRAIN NATRIURETIC PEPTIDE: B Natriuretic Peptide: 1618.3 pg/mL — ABNORMAL HIGH (ref 0.0–100.0)

## 2022-06-24 MED ORDER — EMPAGLIFLOZIN 25 MG PO TABS: ORAL_TABLET | ORAL | 3 refills | Status: AC

## 2022-06-24 NOTE — Progress Notes (Signed)
Patient presents for VAD consult in [facility] today alone.   Pt presented ambulatory using cane today alone. Denies lightheadedness, falls, and signs of bleeding. Reports he is dizzy "some days." Reports intermittent shortness that resolves with use of home O2 and Albuterol inhaler. Reports his major activity limitation comes from shortness of breath. Reports "on and off" overall weakness. States his nausea has improved some over the last two weeks. Appetite has improved and he is eating at least 1 meal most days. He has lost > 20 lbs over the last 6 months.   Pt has a large boil under left armpit. He has an appt tomorrow at the [facility] clinic for this.   Pt SR 77 with occ PVC on monitor today. He stopped Metoprolol as instructed last visit. Pt has not sent back recent 14 day Zio XT to assess for PVC burden. Instructed to mail back today. He verbalized understanding.   Reports he has been out of Jardiance since last Monday. He contacted the [place], and they sent "a different medication to take twice a day for my reflux." Explained that Vania Rea is not used for reflux, that this is a heart failure medication. Refill sent for Jardiance today. Advised pt if he is unable to obtain medication/pick up Jardiance refill he needs to call the VAD office. He verbalized understanding. Pt called VAD coordinator when he got home. New medication [facility] sent him is Omeprazole 40 mg BID. Added this to his medication list.   Pt's wife and daughter did not come today for social evaluation as requested at last visit. States his wife Vita Barley is in the car with their new puppy, and his daughter is working. He says his wife "will sign the paper" to be a caregiver but it is "too much" to sit in on his clinic appt today. Pt repeatedly states "this is a lot and very overwhelming for my wife." He shares that he is "not sure she can handle" his health issues and she is "in denial that he is this sick" despite conversation with VAD coordinator  and Dr Haroldine Laws last visit. States his daughter is his HCPOA and he thinks she would be willing to function as caregiver. Rescheduled social evaluation with [name] CSW to next Tuesday 07/01/22 as his daughter is typically off on Tuesdays. Advised that both his daughter and his wife must be present for social eval, and this will take at least an hour. He verbalized understanding.    VAD evaluation testing scheduled. Provided pt letter with schedule of testing and reviewed twice in clinic. He verbalized understanding of all information.   Orthopantogram obtained today.  Vital Signs:  HR: 77 SR w/occ PVCs BP: 116/56 (84) SPO2: 99% on RA   Weight: 181.8 lb  Last weight: 180.4 lb  Symptom YES NO DETAILS  Angina  x Activity:  Claudication  x How Far:  Syncope  x When:  Stroke  x   Orthopnea   How many pillows:  PND   How often:  CPAP x  How many hours: waiting for new machine  Pedal Edema  x   Abdominal Fullness x  Poor appetite and early satiety    Nausea / Vomit x  Daily nausea improving some. Reports increased appetite  Diaphoresis  x When:  Shortness of Breath x  Activity: Has been using Albuterol inhaler and home O2 3 L PRN. States this helps  Palpitations x  When: frequent PVCs/ bigeminy.   ICD shock  x  Bleeding S/S  x   Tea-colored Urine  x   Hospitalizations  x   Emergency Room  x   Other MD     Activity Reports he is able to "move around and get things done" but is limited by shortness of breath  Fluid   Diet Regular- appetite is improving some   Device: [employer] Therapies: on Last check: at [facility]  Patient Instructions:  No medication changes today Return to clinic in 3 week for follow up with Dr Haroldine Laws. If you are feeling worse please notify VAD coordinators [phone] and we will see you sooner Endocrinology appointment 06/26/22 at 12:10  [address] [place], [place] [address] [phone] 4. Call VAD coordinator with name of medication you were  prescribed from the [place] [facility]. Please attend all scans/testing on the letter provided to you in clinic 6. Social evaluation with [name] scheduled 07/01/22 at 11:00. Please bring your daughter and your wife.   Emerson Monte RN [place] Coordinator  Office: [phone]  24/7 Pager: [phone]

## 2022-06-24 NOTE — Patient Instructions (Addendum)
No medication changes today Return to clinic in 3 week for follow up with Dr Haroldine Laws. If you are feeling worse please notify VAD coordinators [phone] and we will see you sooner Endocrinology appointment 06/26/22 at 12:10  [address] [place],  [address] [phone] 4. Call VAD coordinator with name of medication you were prescribed from the [place] [facility]. Please attend all scans/testing on the letter provided to you in clinic 6. Social evaluation with [name] scheduled 07/01/22 at 11:00. Please bring your daughter and your wife.

## 2022-06-25 ENCOUNTER — Telehealth (HOSPITAL_COMMUNITY): Payer: Self-pay | Admitting: Cardiology

## 2022-06-25 ENCOUNTER — Telehealth (HOSPITAL_COMMUNITY): Payer: Self-pay | Admitting: Licensed Clinical Social Worker

## 2022-06-25 NOTE — Telephone Encounter (Signed)
[place] # [id number] [facility] Valid 06/14/22-12/01/22 [facility] care [facility] contact- Estill Bamberg [phone]

## 2022-06-25 NOTE — Telephone Encounter (Signed)
CSW attempted to contact patient to discuss LVAD assessment scheduled for next week in the clinic and need for wife/caregiver to be present. Patient's cell number does not have voicemail set up and unable to leave a message. CSW will attempt again. Raquel Sarna, [place], [place]

## 2022-06-26 ENCOUNTER — Ambulatory Visit (INDEPENDENT_AMBULATORY_CARE_PROVIDER_SITE_OTHER): Payer: No Typology Code available for payment source | Admitting: Internal Medicine

## 2022-06-26 ENCOUNTER — Encounter: Payer: Self-pay | Admitting: Internal Medicine

## 2022-06-26 VITALS — BP 100/68 | HR 70 | Ht 68.0 in | Wt 178.0 lb

## 2022-06-26 DIAGNOSIS — E039 Hypothyroidism, unspecified: Secondary | ICD-10-CM

## 2022-06-26 MED ORDER — LEVOTHYROXINE SODIUM 25 MCG PO TABS: 25.0000 ug | ORAL_TABLET | Freq: Every day | ORAL | 1 refills | Status: AC

## 2022-06-26 NOTE — Patient Instructions (Signed)

## 2022-06-26 NOTE — Progress Notes (Signed)
Name: Evan Serrano  MRN/ DOB: [id number], July 22, 1950    Age/ Sex: 72 y.o., male    PCP: Bensimhon, Bevelyn Buckles, MD   Reason for Endocrinology Evaluation: Hypothyroidism     Date of Initial Endocrinology Evaluation: 06/26/2022     HPI: Evan Serrano is a 72 y.o. male with a past medical history of OSA, CHF, NICM ( ICD 2018) , HTN and PAF. The patient presented for initial endocrinology clinic visit on 06/26/2022 for consultative assistance with his hypothyroidism.   Pt has been noted with elevated TSH since 05/2022   Pt has been on Amiodarone since 10/2021   He was seen by cardiothoracic surgeon for evaluation of LVAD 06/12/2022   Weight has been fluctuating  Denies constipation except for once recently  Has decreased appetite Denies local neck swelling Denies palpitations  Denies tremors  Has been weak, has a walker since 2020 but did not need it until last month     No FH of thyroid disease     HISTORY:  Past Medical History:  Past Medical History:  Diagnosis Date   Cardiac pacemaker    CHF (congestive heart failure) (HCC)    diagnosed in 2011   Chronic hypoxemic respiratory failure (HCC)    CKD (chronic kidney disease) stage 3, GFR 30-59 ml/min (HCC)    3a   COVID    Hypertension    NICM (nonischemic cardiomyopathy) (HCC)    OSA on CPAP    PAF (paroxysmal atrial fibrillation) (HCC)    on Eliquis   Presence of cardiac defibrillator    Past Surgical History:  Past Surgical History:  Procedure Laterality Date   PACEMAKER IMPLANT     RIGHT/LEFT HEART CATH AND CORONARY ANGIOGRAPHY N/A 06/14/2021   Procedure: RIGHT/LEFT HEART CATH AND CORONARY ANGIOGRAPHY;  Surgeon: Tonny Bollman, MD;  Location: [facility] INVASIVE CV LAB;  Service: Cardiovascular;  Laterality: N/A;    Social History:  reports that he has never smoked. He has never used smokeless tobacco. He reports that he does not drink alcohol and does not use drugs. Family History: family history  includes Heart attack in his father and mother.   HOME MEDICATIONS: Allergies as of 06/26/2022       Reactions   Iodinated Contrast Media Shortness Of Breath, Rash, Cough        Medication List        Accurate as of June 26, 2022  2:59 PM. If you have any questions, ask your nurse or doctor.          STOP taking these medications    simvastatin 80 MG tablet Commonly known as: ZOCOR Stopped by: Scarlette Shorts, MD       TAKE these medications    albuterol 108 (90 Base) MCG/ACT inhaler Commonly known as: VENTOLIN HFA Inhale 2 puffs into the lungs every 6 (six) hours as needed for wheezing.   amiodarone 200 MG tablet Commonly known as: PACERONE Take 1 tablet (200 mg total) by mouth daily.   atorvastatin 40 MG tablet Commonly known as: LIPITOR Take 1 tablet (40 mg total) by mouth daily. What changed: when to take this   carvedilol 25 MG tablet Commonly known as: COREG Take 25 mg by mouth 2 (two) times daily with a meal.   doxycycline 100 MG tablet Commonly known as: VIBRA-TABS Take 100 mg by mouth 2 (two) times daily.   Eliquis 5 MG Tabs tablet Generic drug: apixaban Take 1 tablet (5 mg total) by mouth  2 (two) times daily.   empagliflozin 25 MG Tabs tablet Commonly known as: Jardiance Take 12.5 mg (1/2 tablet) daily.   Glucerna Liqd Take 237 mLs by mouth daily in the afternoon.   levothyroxine 25 MCG tablet Commonly known as: SYNTHROID Take 1 tablet (25 mcg total) by mouth daily. Started by: Dorita Sciara, MD   lisinopril 40 MG tablet Commonly known as: ZESTRIL Take 40 mg by mouth daily.   loratadine 10 MG tablet Commonly known as: CLARITIN Take 10 mg by mouth daily.   multivitamin with minerals Tabs tablet Take 1 tablet by mouth daily.   omeprazole 40 MG capsule Commonly known as: PRILOSEC Take 40 mg by mouth in the morning and at bedtime.   OXYGEN Inhale into the lungs as needed.   potassium chloride SA 20 MEQ  tablet Commonly known as: KLOR-CON M Take 2 tablets (40 mEq total) by mouth daily.   Spiriva Respimat 2.5 MCG/ACT Aers Generic drug: Tiotropium Bromide Monohydrate Inhale 2 puffs into the lungs daily.   torsemide 20 MG tablet Commonly known as: DEMADEX Take 4 tablets (80 mg total) by mouth every morning.   Vitamin D (Cholecalciferol) 10 MCG (400 UNIT) Caps Take 800 Units by mouth daily.          REVIEW OF SYSTEMS: A comprehensive ROS was conducted with the patient and is negative except as per HPI    OBJECTIVE:  VS: BP 100/68 (BP Location: Left Arm, Patient Position: Sitting, Cuff Size: Small)   Pulse 70   Ht 5\' 8"  (1.727 m)   Wt 178 lb (80.7 kg)   SpO2 94%   BMI 27.06 kg/m    Wt Readings from Last 3 Encounters:  06/26/22 178 lb (80.7 kg)  06/24/22 181 lb 12.8 oz (82.5 kg)  06/12/22 176 lb (79.8 kg)     EXAM: General: Pt appears well and is in NAD  Eyes: External eye exam normal without stare, lid lag or exophthalmos.  Neck: General: Supple without adenopathy. Thyroid: Thyroid size normal.  No goiter or nodules appreciated.   Lungs: Clear with good BS bilat   Heart: Auscultation: RRR.  Abdomen: Normoactive bowel sounds, soft, nontender, without masses or organomegaly palpable  Extremities:  BL LE: No pretibial edema normal ROM and strength.  Mental Status: Judgment, insight: Intact Orientation: Oriented to time, place, and person Mood and affect: No depression, anxiety, or agitation     DATA REVIEWED:     Latest Reference Range & Units 05/23/22 11:27 05/23/22 11:31 06/10/22 09:45  TSH 0.350 - 4.500 uIU/mL 10.587 (H)  14.738 (H)  Triiodothyronine,Free,Serum 2.0 - 4.4 pg/mL 2.7  2.1  T4,Free(Direct) 0.61 - 1.12 ng/dL  1.24 (H) 1.26 (H)    ASSESSMENT/PLAN/RECOMMENDATIONS:   Hypothyroidism:  -Patient with weakness that has been worsening over the past month, and clear how much of this is related to his thyroid versus CHF versus medication side  effect -I have recommended starting him on LT-for replacement - Pt educated extensively on the correct way to take levothyroxine (first thing in the morning with water, 30 minutes before eating or taking other medications). - Pt encouraged to double dose the following day if she were to miss a dose given long half-life of levothyroxine.   Medications : Start levothyroxine 25 mcg daily  Labs in 6 weeks Follow-up in 4 months   Signed electronically by: Mack Guise, MD  [facility] Endocrinology  [facility] [address], [place], [place] [address]  Phone: [phone] FAX: [phone]   CC: Jolaine Artist, MD [address] [place] [address] Phone: [phone] Fax: [phone]   Return to Endocrinology clinic as below: Future Appointments  Date Time Provider [place]  06/27/2022  7:30 AM [facility]-CT 1 WL-CT   06/27/2022 10:00 AM [facility] VASC US 1 - OUTPATIENT ONLY [facility]  06/27/2022 11:00 AM [facility] VASC US 1 - OUTPATIENT ONLY [facility]  06/30/2022  1:00 PM [facility] PFT ROOM [facility] None  06/30/2022  2:00 PM Freddi Starr, MD [facility] None  07/01/2022 11:00 AM [facility] VAD CLINIC [facility] None  07/15/2022  9:00 AM Bensimhon, Shaune Pascal, MD [facility] None  08/12/2022 11:45 AM [facility] ENDO/NEURO LAB [facility] None  12/26/2022 12:10 PM Adreana Coull, Melanie Crazier, MD [facility] None

## 2022-06-27 ENCOUNTER — Ambulatory Visit (HOSPITAL_BASED_OUTPATIENT_CLINIC_OR_DEPARTMENT_OTHER)
Admission: RE | Admit: 2022-06-27 | Discharge: 2022-06-27 | Disposition: A | Discharge status: Home / Self Care | Payer: Medicare PPO | Source: Ambulatory Visit | Attending: Internal Medicine | Admitting: Internal Medicine

## 2022-06-27 ENCOUNTER — Ambulatory Visit (HOSPITAL_COMMUNITY)
Admission: RE | Admit: 2022-06-27 | Discharge: 2022-06-27 | Disposition: A | Discharge status: Home / Self Care | Payer: Medicare PPO | Source: Ambulatory Visit | Attending: Internal Medicine | Admitting: Internal Medicine

## 2022-06-27 DIAGNOSIS — I517 Cardiomegaly: Secondary | ICD-10-CM | POA: Insufficient documentation

## 2022-06-27 DIAGNOSIS — Z01818 Encounter for other preprocedural examination: Secondary | ICD-10-CM

## 2022-06-27 DIAGNOSIS — N289 Disorder of kidney and ureter, unspecified: Secondary | ICD-10-CM | POA: Insufficient documentation

## 2022-06-27 DIAGNOSIS — I5022 Chronic systolic (congestive) heart failure: Secondary | ICD-10-CM

## 2022-06-27 DIAGNOSIS — N400 Enlarged prostate without lower urinary tract symptoms: Secondary | ICD-10-CM | POA: Insufficient documentation

## 2022-06-27 DIAGNOSIS — I700 Atherosclerosis of aorta: Secondary | ICD-10-CM | POA: Diagnosis not present

## 2022-06-27 DIAGNOSIS — I509 Heart failure, unspecified: Secondary | ICD-10-CM

## 2022-06-27 DIAGNOSIS — I2510 Atherosclerotic heart disease of native coronary artery without angina pectoris: Secondary | ICD-10-CM | POA: Diagnosis not present

## 2022-06-27 LAB — VAS US DOPPLER PRE VAD
Left ABI: 1.21
Right ABI: 1.09

## 2022-06-27 NOTE — Progress Notes (Signed)
Pre vad and lower extremity venous duplex has been completed.   Preliminary results in CV Proc.   Jinny Blossom Lorielle Boehning 06/27/2022 10:59 AM

## 2022-06-30 ENCOUNTER — Ambulatory Visit: Payer: No Typology Code available for payment source | Admitting: Pulmonary Disease

## 2022-06-30 ENCOUNTER — Encounter (HOSPITAL_COMMUNITY): Payer: Self-pay

## 2022-06-30 NOTE — Progress Notes (Signed)
Patient called VAD Clinic this morning requesting to cancel tomorrow's appointment for social work evaluation. Patient states after lengthy discussion with his wife, daughter and church family he has decided VAD is not the best option for him. Dicussed with Dr. Haroldine Laws. Will plan to keep appointment 07/15/22 at 0900.  Bobbye Morton RN, BSN VAD Coordinator 24/7 Pager [phone]

## 2022-07-01 ENCOUNTER — Other Ambulatory Visit (HOSPITAL_COMMUNITY): Payer: No Typology Code available for payment source

## 2022-07-07 NOTE — Addendum Note (Signed)
Encounter addended by: Micki Riley, RN on: 07/07/2022 1:34 PM  Actions taken: Imaging Exam ended

## 2022-07-15 ENCOUNTER — Other Ambulatory Visit (HOSPITAL_COMMUNITY): Payer: Self-pay | Admitting: *Deleted

## 2022-07-15 ENCOUNTER — Encounter (HOSPITAL_COMMUNITY): Payer: Self-pay | Admitting: Internal Medicine

## 2022-07-15 ENCOUNTER — Ambulatory Visit (HOSPITAL_COMMUNITY)
Admission: RE | Admit: 2022-07-15 | Discharge: 2022-07-15 | Disposition: A | Discharge status: Home / Self Care | Payer: No Typology Code available for payment source | Source: Ambulatory Visit | Attending: Internal Medicine | Admitting: Internal Medicine

## 2022-07-15 VITALS — BP 104/73 | HR 84 | Temp 97.7°F | Wt 182.0 lb

## 2022-07-15 DIAGNOSIS — Z95810 Presence of automatic (implantable) cardiac defibrillator: Secondary | ICD-10-CM | POA: Diagnosis not present

## 2022-07-15 DIAGNOSIS — I2510 Atherosclerotic heart disease of native coronary artery without angina pectoris: Secondary | ICD-10-CM | POA: Diagnosis not present

## 2022-07-15 DIAGNOSIS — I428 Other cardiomyopathies: Secondary | ICD-10-CM | POA: Diagnosis not present

## 2022-07-15 DIAGNOSIS — Z79899 Other long term (current) drug therapy: Secondary | ICD-10-CM | POA: Insufficient documentation

## 2022-07-15 DIAGNOSIS — I5042 Chronic combined systolic (congestive) and diastolic (congestive) heart failure: Secondary | ICD-10-CM | POA: Diagnosis present

## 2022-07-15 DIAGNOSIS — Z9981 Dependence on supplemental oxygen: Secondary | ICD-10-CM | POA: Insufficient documentation

## 2022-07-15 DIAGNOSIS — Z8249 Family history of ischemic heart disease and other diseases of the circulatory system: Secondary | ICD-10-CM | POA: Diagnosis not present

## 2022-07-15 DIAGNOSIS — I480 Paroxysmal atrial fibrillation: Secondary | ICD-10-CM | POA: Insufficient documentation

## 2022-07-15 DIAGNOSIS — N1832 Chronic kidney disease, stage 3b: Secondary | ICD-10-CM | POA: Diagnosis not present

## 2022-07-15 DIAGNOSIS — J9611 Chronic respiratory failure with hypoxia: Secondary | ICD-10-CM | POA: Insufficient documentation

## 2022-07-15 DIAGNOSIS — E1122 Type 2 diabetes mellitus with diabetic chronic kidney disease: Secondary | ICD-10-CM | POA: Insufficient documentation

## 2022-07-15 DIAGNOSIS — I130 Hypertensive heart and chronic kidney disease with heart failure and stage 1 through stage 4 chronic kidney disease, or unspecified chronic kidney disease: Secondary | ICD-10-CM | POA: Insufficient documentation

## 2022-07-15 DIAGNOSIS — I509 Heart failure, unspecified: Secondary | ICD-10-CM | POA: Diagnosis not present

## 2022-07-15 DIAGNOSIS — N1831 Chronic kidney disease, stage 3a: Secondary | ICD-10-CM

## 2022-07-15 DIAGNOSIS — I493 Ventricular premature depolarization: Secondary | ICD-10-CM | POA: Diagnosis not present

## 2022-07-15 DIAGNOSIS — Z8616 Personal history of COVID-19: Secondary | ICD-10-CM | POA: Diagnosis not present

## 2022-07-15 DIAGNOSIS — R946 Abnormal results of thyroid function studies: Secondary | ICD-10-CM | POA: Insufficient documentation

## 2022-07-15 DIAGNOSIS — Z7901 Long term (current) use of anticoagulants: Secondary | ICD-10-CM | POA: Insufficient documentation

## 2022-07-15 DIAGNOSIS — G4733 Obstructive sleep apnea (adult) (pediatric): Secondary | ICD-10-CM | POA: Diagnosis not present

## 2022-07-15 DIAGNOSIS — I5022 Chronic systolic (congestive) heart failure: Secondary | ICD-10-CM

## 2022-07-15 LAB — BASIC METABOLIC PANEL
Anion gap: 12 (ref 5–15)
BUN: 18 mg/dL (ref 8–23)
CO2: 27 mmol/L (ref 22–32)
Calcium: 9 mg/dL (ref 8.9–10.3)
Chloride: 102 mmol/L (ref 98–111)
Creatinine, Ser: 1.57 mg/dL — ABNORMAL HIGH (ref 0.61–1.24)
GFR, Estimated: 47 mL/min — ABNORMAL LOW (ref >60)
Glucose, Bld: 100 mg/dL — ABNORMAL HIGH (ref 70–99)
Potassium: 3.1 mmol/L — ABNORMAL LOW (ref 3.5–5.1)
Sodium: 141 mmol/L (ref 135–145)

## 2022-07-15 LAB — BRAIN NATRIURETIC PEPTIDE: B Natriuretic Peptide: 1217.8 pg/mL — ABNORMAL HIGH (ref 0.0–100.0)

## 2022-07-15 MED ORDER — POTASSIUM CHLORIDE CRYS ER 20 MEQ PO TBCR: 40.0000 meq | EXTENDED_RELEASE_TABLET | Freq: Two times a day (BID) | ORAL | 3 refills | Status: DC

## 2022-07-15 NOTE — Patient Instructions (Addendum)
Please take Atorvastatin 40 mg ONCE a day. You may take at bedtime if you wish Return to heart failure clinic for follow up with Dr Haroldine Laws in 1 month. Please call the heart failure clinic if your heart failure symptoms worsen

## 2022-07-15 NOTE — Progress Notes (Signed)
ADVANCED HF CLINIC NOTE  Primary Care: [place]  HF Cardiologist: Dr. Haroldine Laws  HPI: [name] is a 72 y.o. male with HFrEF (EF 20-25%, Echo 06/2021) 2/2 NICM with ICD placed 99991111, HTN complicated by orthostatic hypotension, PAF (CHADSVASC 3) on chronic AC with eliquis, OSA on CPAP, chronic hypoxemic respiratory failure on 1L of O2 at home (since 06/2021) and CKD stage IIIa.   HFrEF [place] after patient had URI. He underwent left heart catheterization at that time and was told his coronary arteries were "clean".  On 12/26/222 had ICD (MDT) shock for an episode of ventricular fibrillation after failed ATP.   In 1/23 hospitalized with a/c HF. Echo EF 20-25%, severely dilated LV, G3 DD, severely dilated LA/RA, moderate MR and TR, mild AR. Not significantly changed from 12/2020 echo. LHC mild non obstructive CAD. ~50% mid and distal RCA. RHC: mean PAP 38, RA 9, PCWP 19 Fick 4.3/2.0  Zio 5/23 1. Sinus rhythm - avg HR of 77 bpm 2. 84 runs of non-sustained Ventricular Tachycardia -longest lasting 16 beats with an avg rate of 115 bpm. 3. Isolated PVCs were frequent (10.4%, [id number]) There was one primary PVC morphology (10.4%)  PYP 5/23 mildly positive. SPEP negative. Genetic testing for TTR amyloid negative.   Echo today 05/23/22 EF < 20% RV moderately down severe MR  CPX 9/23: Peak VO2: 13.9 (58% predicted peak VO2) VE/VCO2 slope:  34 Peak RER: 1.14   We saw him several times over the past 2 months to discuss possible VAD which he was initially interested in. Has been seen by Dr. Tenny Craw and also presented at [facility] However after discussions with his family he has decided against proceeding.   Here for f/u. Says he is feeling pretty good. Able to all ADls without too much problem. Swelling improved. No CP, orthopnea or PND. Compliant with meds.   Cardiac Studies: - PYP (5/23): mildly positive  - R/LHC (1/23): mild non-obs CAD, 50% mid and distal RCA RA 9 PAP mean  38 PCWP 19  - Echo (1/23): EF 20-25%  - Echo (8/22): EF 20-25%, severe LV dysfunction, grade III DD, mildly reduced RV, mild to moderate MR  Past Medical History:  Diagnosis Date   Cardiac pacemaker    CHF (congestive heart failure) ([place])    diagnosed in 2011   Chronic hypoxemic respiratory failure (HCC)    CKD (chronic kidney disease) stage 3, GFR 30-59 ml/min (HCC)    3a   COVID    Hypertension    NICM (nonischemic cardiomyopathy) (HCC)    OSA on CPAP    PAF (paroxysmal atrial fibrillation) (HCC)    on Eliquis   Presence of cardiac defibrillator    Current Outpatient Medications  Medication Sig Dispense Refill   albuterol (VENTOLIN HFA) 108 (90 Base) MCG/ACT inhaler Inhale 2 puffs into the lungs every 6 (six) hours as needed for wheezing.     amiodarone (PACERONE) 200 MG tablet Take 1 tablet (200 mg total) by mouth daily. 90 tablet 3   apixaban (ELIQUIS) 5 MG TABS tablet Take 1 tablet (5 mg total) by mouth 2 (two) times daily. 60 tablet 3   atorvastatin (LIPITOR) 40 MG tablet Take 1 tablet (40 mg total) by mouth daily. (Patient taking differently: Take 40 mg by mouth in the morning and at bedtime.) 60 tablet 1   carvedilol (COREG) 25 MG tablet Take 25 mg by mouth 2 (two) times daily with a meal.     doxycycline (  VIBRA-TABS) 100 MG tablet Take 100 mg by mouth 2 (two) times daily.     empagliflozin (JARDIANCE) 25 MG TABS tablet Take 12.5 mg (1/2 tablet) daily. 45 tablet 3   levothyroxine (SYNTHROID) 25 MCG tablet Take 1 tablet (25 mcg total) by mouth daily. 90 tablet 1   lisinopril (ZESTRIL) 40 MG tablet Take 40 mg by mouth daily.     loratadine (CLARITIN) 10 MG tablet Take 10 mg by mouth daily.     Multiple Vitamin (MULTIVITAMIN WITH MINERALS) TABS tablet Take 1 tablet by mouth daily.     omeprazole (PRILOSEC) 40 MG capsule Take 40 mg by mouth in the morning and at bedtime.     OXYGEN Inhale into the lungs as needed.     torsemide (DEMADEX) 20 MG tablet Take 4 tablets (80 mg  total) by mouth every morning. 120 tablet 5   Vitamin D, Cholecalciferol, 10 MCG (400 UNIT) CAPS Take 800 Units by mouth daily.     Glucerna (GLUCERNA) LIQD Take 237 mLs by mouth daily in the afternoon. (Patient not taking: Reported on 07/15/2022)     potassium chloride SA (KLOR-CON M) 20 MEQ tablet Take 2 tablets (40 mEq total) by mouth 2 (two) times daily. 360 tablet 3   Tiotropium Bromide Monohydrate (SPIRIVA RESPIMAT) 2.5 MCG/ACT AERS Inhale 2 puffs into the lungs daily. (Patient not taking: Reported on 07/15/2022) 4 g 11   No current facility-administered medications for this encounter.   Allergies  Allergen Reactions   Iodinated Contrast Media Shortness Of Breath, Rash and Cough   Social History   Socioeconomic History   Marital status: Married    Spouse name: Not on file   Number of children: Not on file   Years of education: Not on file   Highest education level: Not on file  Occupational History   Not on file  Tobacco Use   Smoking status: Never   Smokeless tobacco: Never  Vaping Use   Vaping Use: Never used  Substance and Sexual Activity   Alcohol use: Never   Drug use: Never   Sexual activity: Not Currently  Other Topics Concern   Not on file  Social History Narrative   Not on file   Social Determinants of Health   Financial Resource Strain: Not on file  Food Insecurity: No Food Insecurity (04/21/2019)   Hunger Vital Sign    Worried About Running Out of Food in the Last Year: Never true    Ran Out of Food in the Last Year: Never true  Transportation Needs: No Transportation Needs (04/21/2019)   PRAPARE - Hydrologist (Medical): No    Lack of Transportation (Non-Medical): No  Physical Activity: Inactive (04/21/2019)   Exercise Vital Sign    Days of Exercise per Week: 0 days    Minutes of Exercise per Session: 0 min  Stress: No Stress Concern Present (04/21/2019)   [place]    Feeling of Stress : Only a little  Social Connections: Moderately Integrated (04/21/2019)   Social Connection and Isolation Panel [NHANES]    Frequency of Communication with Friends and Family: Three times a week    Frequency of Social Gatherings with Friends and Family: Once a week    Attends Religious Services: More than 4 times per year    Active Member of [employer] or Organizations: No    Attends Archivist Meetings: Never    Marital Status:  Married  Intimate Partner Violence: Not At Risk (04/21/2019)   Humiliation, Afraid, Rape, and Kick questionnaire    Fear of Current or Ex-Partner: No    Emotionally Abused: No    Physically Abused: No    Sexually Abused: No   Family History  Problem Relation Age of Onset   Heart attack Mother    Heart attack Father    BP 104/73 Comment: map 84  Pulse 84   Temp 97.7 F (36.5 C) (Oral)   Wt 82.6 kg (182 lb)   SpO2 100%   BMI 27.67 kg/m   Wt Readings from Last 3 Encounters:  07/15/22 82.6 kg (182 lb)  06/26/22 80.7 kg (178 lb)  06/24/22 82.5 kg (181 lb 12.8 oz)   PHYSICAL EXAM: General:  thin male. No resp difficulty HEENT: normal Neck: supple. no JVD. Carotids 2+ bilat; no bruits. No lymphadenopathy or thryomegaly appreciated. Cor: PMI nondisplaced. Regular rate & rhythm. No rubs, gallops or murmurs. Lungs: clear Abdomen: soft, nontender, nondistended. No hepatosplenomegaly. No bruits or masses. Good bowel sounds. Extremities: no cyanosis, clubbing, rash, edema Neuro: alert & orientedx3, cranial nerves grossly intact. moves all 4 extremities w/o difficulty. 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  - Cath (1/23): non-obstructive CAD - s/p MDT ICD. - Echo 05/23/22 EF < 20% RV moderately down severe MR - CPX 9/23: Peak VO2: 13.9 (58% predicted peak VO2) VE/VCO2 slope:  34 Peak RER: 1.14  - Mildly  improved NYHA III-IIIb - Volume status ok  - Off b-blocker due to low output - Continue Jardiance 12.5 mg bid. No GU symptoms. - Off BiDil with low BP. - Etiology of CM remains unclear. On Zio he has frequent PVCs and PYP also appears positive. - Continue amio 200 daily to suppress PVCs. Repeat Zio pending - Multiple myeloma panel & urine immunofixation negative. - Genetic testing negative for TTR amyloid negative - Although PYP scan is marginally positive, echo does not look like TTR and genetic testing is negative. Unable to get cMRI due to ICD. EF has not improved with attempts at PVC suppression  - CPX c/w with advanced/end-stage HF.  - Has been seen by Dr. Tenny Craw and also presented at [facility]. Now has [facility] approval to proceed with VAD w/u. However he has d/w his family and now has decided not to proceed.  - labs today - Will repeat echo in near future to see if EF improving at all with PVc suppression  2. Paroxysmal atrial fibrillation - In SR today - On amio - Continue Eliquis 5 mg bid. No bleeding  3. CKD stage 3b - Baseline SCr 1.7-1.9 - Repeat labs today  4. OSA on CPAP  - Follows with Dr. Radford Pax.  5. CAD  - non obstructive on [facility] 1/23. - No s/s angina - No ASA with Eliquis  - Continue atorvastatin.  6. 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 Frequent PVCs - repeat zio in process - continue amio   8. Elevated TSH and T4 - likely due to amio - has been referred to Endo    Glori Bickers, MD  9:30 AM

## 2022-07-15 NOTE — Progress Notes (Signed)
Patient presents for 3 week f/u in [facility] today alone.   Pt presented ambulatory using cane today alone. Denies dizziness, lightheadedness, falls, and signs of bleeding. Reports intermittent shortness has improved over the last few weeks, and that he has not had to use home O2 or PRN Albuterol inhaler as often. Reports nausea and appetite have both improved over the last few weeks. Shares that he is leaving tomorrow to go to [facility] for 10 days for his nephew's funeral.  Pt was seen by [facility] in January. Started on Levothyroxine 25 mg daily. Pt states he is taking medication as prescribed. He will need follow up thyroid labs in March. He has a scheduled lab appt with endo 08/12/22.   Pt states he is taking Atorvastatin 40 mg BID. Per Dr Haroldine Laws pt to take one tablet daily.   Large boil under left armpit has resolved with course of Doxycycline prescribed by the [place].   Pt SR 84 with occ PVC on monitor today. Zio XT results reviewed with Dr Haroldine Laws. No changes to medications today.   Dr Haroldine Laws had extensive conversation again with him today regarding his heart failure. Pt confirms again today that he is no longer interested in pursuing VAD at this time. He verbalized that it was difficult for him to get use to life with ICD implanted in his chest, and he can not imagine living life with VAD equipment attached to him. States he has had many conversations with his immediate family, extended family, and church family, and they all are encouraging him to pursue medical non-surgical management. States that his anxiety and mental health have improved with the decision to live his life without major surgery. Pt again states "this is a lot and very overwhelming for my wife and daughter." He shares that he is "not sure they can handle or understand my health issues." At this time pt will transition back to heart failure for follow up.   Pt has PFTs scheduled 09/01/22 with follow up with  Dr Erin Fulling.   Vital Signs:  HR: 84 SR w/occ PVCs BP: 104/73 (84) SPO2: 100% on RA   Weight: 182 lb  Last weight: 181.8 lb  Symptom YES NO DETAILS  Angina  x Activity:  Claudication  x How Far:  Syncope  x When:  Stroke  x   Orthopnea  x How many pillows:  PND   How often:  CPAP x  How many hours: waiting for new machine  Pedal Edema  x   Abdominal Fullness  x Appetite has improved over the last few weeks.   Nausea / Vomit  x Nausea has improved  Diaphoresis  x When:  Shortness of Breath  x Denies shortness of breath. He has only had to use home O2 a few times over the last 3 weeks. Has been using Albuterol inhaler PRN  Palpitations x  When: frequent PVCs/ bigeminy.   ICD shock  x   Bleeding S/S  x   Tea-colored Urine  x   Hospitalizations  x   Emergency Room  x   Other MD     Activity Reports he has been feeling better and has not been as limited by shortness of breath.   Fluid   Diet Regular- appetite is improving   Device: Medtronic Therapies: on Last check: at [facility]  Patient Instructions:  Please take Atorvastatin 40 mg ONCE a day. You may take at bedtime if you wish Return to heart failure clinic  for follow up with Dr Haroldine Laws in 1 month. Please call the heart failure clinic if your heart failure symptoms worsen  Addendum: K 3.1 today. Pt currently taking K 40 meq daily with Torsemide 80 mg daily. Discussed with Dr Haroldine Laws. Order received to increase K to 40 meq BID and repeat BMET in 10 days. Spoke with pt regarding medication change and lab appt scheduled 07/29/22 at 10:00. He verbalized understanding of med change and appt. Updated prescription sent to [facility] per pt request.    Emerson Monte RN [place] Coordinator  Office: [phone]  24/7 Pager: [phone]

## 2022-07-16 ENCOUNTER — Ambulatory Visit: Payer: No Typology Code available for payment source | Admitting: Pulmonary Disease

## 2022-07-29 ENCOUNTER — Other Ambulatory Visit (HOSPITAL_COMMUNITY): Payer: Self-pay

## 2022-07-29 ENCOUNTER — Ambulatory Visit (HOSPITAL_COMMUNITY)
Admission: RE | Admit: 2022-07-29 | Discharge: 2022-07-29 | Disposition: A | Discharge status: Home / Self Care | Payer: No Typology Code available for payment source | Source: Ambulatory Visit | Attending: Cardiology | Admitting: Cardiology

## 2022-07-29 ENCOUNTER — Encounter (HOSPITAL_COMMUNITY): Payer: Self-pay

## 2022-07-29 DIAGNOSIS — I5022 Chronic systolic (congestive) heart failure: Secondary | ICD-10-CM

## 2022-07-29 DIAGNOSIS — I509 Heart failure, unspecified: Secondary | ICD-10-CM

## 2022-07-29 LAB — BASIC METABOLIC PANEL
Anion gap: 11 (ref 5–15)
BUN: 23 mg/dL (ref 8–23)
CO2: 25 mmol/L (ref 22–32)
Calcium: 9.8 mg/dL (ref 8.9–10.3)
Chloride: 100 mmol/L (ref 98–111)
Creatinine, Ser: 2.21 mg/dL — ABNORMAL HIGH (ref 0.61–1.24)
GFR, Estimated: 31 mL/min — ABNORMAL LOW (ref >60)
Glucose, Bld: 110 mg/dL — ABNORMAL HIGH (ref 70–99)
Potassium: 5.2 mmol/L — ABNORMAL HIGH (ref 3.5–5.1)
Sodium: 136 mmol/L (ref 135–145)

## 2022-07-29 MED ORDER — POTASSIUM CHLORIDE CRYS ER 20 MEQ PO TBCR: 40.0000 meq | EXTENDED_RELEASE_TABLET | Freq: Every day | ORAL | 3 refills | Status: AC

## 2022-07-29 NOTE — Progress Notes (Signed)
Patient Potassium 5.2 on repeat BMET today. Per Dr. Haroldine Laws HOLD potassium for 2 days and then resume 40 mEq daily (2 pills). Patient called with instructions and verbalizes understanding.   Bobbye Morton RN, BSN VAD Coordinator 24/7 Pager [phone]

## 2022-08-06 ENCOUNTER — Telehealth (HOSPITAL_COMMUNITY): Payer: Self-pay

## 2022-08-06 NOTE — Telephone Encounter (Signed)
Parking placard form placed up front for him to pick up. Left message to let him know.

## 2022-08-12 ENCOUNTER — Telehealth (HOSPITAL_COMMUNITY): Payer: Self-pay | Admitting: *Deleted

## 2022-08-12 ENCOUNTER — Other Ambulatory Visit: Payer: No Typology Code available for payment source

## 2022-08-12 NOTE — Telephone Encounter (Signed)
Called patients' wife per Dr. Haroldine Laws requesting repeat labs based on elevated potassium level last clinic visit here.  She reports patient is currently hospitalized at [facility] in [place]. No instructions from [place] at this time.

## 2022-08-20 ENCOUNTER — Emergency Department (HOSPITAL_COMMUNITY): Payer: No Typology Code available for payment source

## 2022-08-20 ENCOUNTER — Emergency Department (HOSPITAL_COMMUNITY)
Admission: EM | Admit: 2022-08-20 | Discharge: 2022-08-20 | Disposition: A | Discharge status: Home / Self Care | Payer: No Typology Code available for payment source | Attending: Emergency Medicine | Admitting: Emergency Medicine

## 2022-08-20 DIAGNOSIS — Z95810 Presence of automatic (implantable) cardiac defibrillator: Secondary | ICD-10-CM | POA: Diagnosis not present

## 2022-08-20 DIAGNOSIS — R42 Dizziness and giddiness: Secondary | ICD-10-CM | POA: Diagnosis present

## 2022-08-20 DIAGNOSIS — Z7901 Long term (current) use of anticoagulants: Secondary | ICD-10-CM | POA: Diagnosis not present

## 2022-08-20 DIAGNOSIS — G253 Myoclonus: Secondary | ICD-10-CM | POA: Diagnosis not present

## 2022-08-20 DIAGNOSIS — N189 Chronic kidney disease, unspecified: Secondary | ICD-10-CM | POA: Insufficient documentation

## 2022-08-20 DIAGNOSIS — I509 Heart failure, unspecified: Secondary | ICD-10-CM | POA: Diagnosis not present

## 2022-08-20 LAB — CBC WITH DIFFERENTIAL/PLATELET
Abs Immature Granulocytes: 0.01 10*3/uL (ref 0.00–0.07)
Basophils Absolute: 0.1 10*3/uL (ref 0.0–0.1)
Basophils Relative: 1 %
Eosinophils Absolute: 0.2 10*3/uL (ref 0.0–0.5)
Eosinophils Relative: 3 %
HCT: 45.4 % (ref 39.0–52.0)
Hemoglobin: 15.4 g/dL (ref 13.0–17.0)
Immature Granulocytes: 0 %
Lymphocytes Relative: 32 %
Lymphs Abs: 1.6 10*3/uL (ref 0.7–4.0)
MCH: 28.6 pg (ref 26.0–34.0)
MCHC: 33.9 g/dL (ref 30.0–36.0)
MCV: 84.4 fL (ref 80.0–100.0)
Monocytes Absolute: 0.7 10*3/uL (ref 0.1–1.0)
Monocytes Relative: 15 %
Neutro Abs: 2.4 10*3/uL (ref 1.7–7.7)
Neutrophils Relative %: 49 %
Platelets: 129 10*3/uL — ABNORMAL LOW (ref 150–400)
RBC: 5.38 MIL/uL (ref 4.22–5.81)
RDW: 17.7 % — ABNORMAL HIGH (ref 11.5–15.5)
WBC: 5 10*3/uL (ref 4.0–10.5)
nRBC: 0 % (ref 0.0–0.2)

## 2022-08-20 LAB — BASIC METABOLIC PANEL
Anion gap: 11 (ref 5–15)
BUN: 17 mg/dL (ref 8–23)
CO2: 28 mmol/L (ref 22–32)
Calcium: 9.6 mg/dL (ref 8.9–10.3)
Chloride: 96 mmol/L — ABNORMAL LOW (ref 98–111)
Creatinine, Ser: 1.68 mg/dL — ABNORMAL HIGH (ref 0.61–1.24)
GFR, Estimated: 43 mL/min — ABNORMAL LOW (ref >60)
Glucose, Bld: 90 mg/dL (ref 70–99)
Potassium: 3.3 mmol/L — ABNORMAL LOW (ref 3.5–5.1)
Sodium: 135 mmol/L (ref 135–145)

## 2022-08-20 MED ORDER — POTASSIUM CHLORIDE CRYS ER 20 MEQ PO TBCR
40.0000 meq | EXTENDED_RELEASE_TABLET | Freq: Once | ORAL | Status: AC
  Administered 2022-08-20: 40 meq via ORAL
  Filled 2022-08-20: qty 2

## 2022-08-20 NOTE — ED Provider Triage Note (Signed)
Emergency Medicine Provider Triage Evaluation Note  Evan Serrano , a 72 y.o. male  was evaluated in triage.  Pt complains of full body "shock sensation" that occurred a few times today.  Patient has PICC in place on right side with continuous dobutamine infusion.  His ICD was recently disconnected.  He states the shock feeling does not feel the same as when his ICD fires.    Review of Systems  Positive: As above Negative: As above  Physical Exam  BP 108/63 (BP Location: Right Arm)   Pulse 80   Temp 98.3 F (36.8 C) (Oral)   Resp 20   SpO2 97%  Gen:   Awake, no distress   Resp:  Normal effort  MSK:   Moves extremities without difficulty  Other:    Medical Decision Making  Medically screening exam initiated at 5:37 PM.  Appropriate orders placed.  Evan Serrano was informed that the remainder of the evaluation will be completed by another provider, this initial triage assessment does not replace that evaluation, and the importance of remaining in the ED until their evaluation is complete.     Theressa Stamps R, [place] 08/20/22 [phone]

## 2022-08-20 NOTE — Discharge Instructions (Signed)
You were seen for your shaking in the emergency department.   Follow-up with your primary doctor in 2-3 days regarding your visit.    Return immediately to the emergency department if you experience any of the following: chest pain, shortness of breath, fainting, or any other concerning symptoms.    Thank you for visiting our Emergency Department. It was a pleasure taking care of you today.

## 2022-08-20 NOTE — ED Triage Notes (Signed)
Patient here for evaluation of a full body "shock sensation" that occurred a few times today. Patient has right arm PICC with continues dobutamine infusion and disconnected internal defibrillator. Patient states this shock sensation does not feel like when his defibrillator has fired. Patient is alert, oriented, and in no apparent distress at this time.

## 2022-08-20 NOTE — ED Provider Notes (Signed)
[facility] Provider Note   CSN: [id number] Arrival date & time: 08/20/22  1713     History {Add pertinent medical, surgical, social history, OB history to HPI:1} Chief Complaint  Patient presents with   Follow-up    Evan Serrano is a 72 y.o. male.  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  Patient reports that he was recently hospitalized at the [facility] for low blood pressure and dizziness.  According to the patient he was placed on hospice at that time.  His ICD leads were disconnected on Friday.  Reports that he was sent home with a PICC line and dobutamine on hospice.  Since going home has had several events where he feels like his whole body will jump briefly.  Denies any sensation of being defibrillated.  No chest pain or shortness of breath or dizziness.  Says that he wanted to be checked out to figure out what was going on but wishes to stay on hospice at this time does not want to change his goals of care.       Home Medications Prior to Admission medications   Medication Sig Start Date End Date Taking? Authorizing Provider  albuterol (VENTOLIN HFA) 108 (90 Base) MCG/ACT inhaler Inhale 2 puffs into the lungs every 6 (six) hours as needed for wheezing.    [provider]  amiodarone (PACERONE) 200 MG tablet Take 1 tablet (200 mg total) by mouth daily. 12/17/21   Milford, Maricela Bo, FNP  apixaban (ELIQUIS) 5 MG TABS tablet Take 1 tablet (5 mg total) by mouth 2 (two) times daily. 06/17/21   Hosie Poisson, MD  atorvastatin (LIPITOR) 40 MG tablet Take 1 tablet (40 mg total) by mouth daily. Patient taking differently: Take 40 mg by mouth in the morning and at bedtime. 06/18/21   Hosie Poisson, MD  carvedilol (COREG) 25 MG tablet Take 25 mg by mouth 2 (two) times daily with a  meal.    [provider]  doxycycline (VIBRA-TABS) 100 MG tablet Take 100 mg by mouth 2 (two) times daily. 06/25/22   [provider]  empagliflozin (JARDIANCE) 25 MG TABS tablet Take 12.5 mg (1/2 tablet) daily. 06/24/22   Bensimhon, Shaune Pascal, MD  Glucerna (GLUCERNA) LIQD Take 237 mLs by mouth daily in the afternoon. Patient not taking: Reported on 07/15/2022    [provider]  levothyroxine (SYNTHROID) 25 MCG tablet Take 1 tablet (25 mcg total) by mouth daily. 06/26/22   Shamleffer, Melanie Crazier, MD  lisinopril (ZESTRIL) 40 MG tablet Take 40 mg by mouth daily.    [provider]  loratadine (CLARITIN) 10 MG tablet Take 10 mg by mouth daily.    [provider]  Multiple Vitamin (MULTIVITAMIN WITH MINERALS) TABS tablet Take 1 tablet by mouth daily. 01/14/21   Elgergawy, Silver Huguenin, MD  omeprazole (PRILOSEC) 40 MG capsule Take 40 mg by mouth in the morning and at bedtime.    [provider]  OXYGEN Inhale into the lungs as needed.    [provider]  potassium chloride SA (KLOR-CON M) 20 MEQ tablet Take 2 tablets (40 mEq total) by mouth daily. 07/29/22   Bensimhon, Shaune Pascal, MD  Tiotropium Bromide Monohydrate (SPIRIVA RESPIMAT) 2.5 MCG/ACT AERS Inhale 2 puffs into the lungs daily. Patient not taking: Reported on 07/15/2022 01/29/22   Freda Jackson  B, MD  torsemide (DEMADEX) 20 MG tablet Take 4 tablets (80 mg total) by mouth every morning. 10/18/21   Milford, Maricela Bo, FNP  Vitamin D, Cholecalciferol, 10 MCG (400 UNIT) CAPS Take 800 Units by mouth daily.    [provider]      Allergies    Iodinated contrast media    Review of Systems   Review of Systems  Physical Exam Updated Vital Signs BP 108/84 (BP Location: Right Arm)   Pulse 72   Temp 98.2 F (36.8 C) (Oral)   Resp 17   Ht 5\' 8"  (1.727 m)   Wt 76.2 kg   SpO2 98%   BMI 25.54 kg/m  Physical Exam Vitals and nursing note reviewed.  Constitutional:       General: He is not in acute distress.    Appearance: He is well-developed.  HENT:     Head: Normocephalic and atraumatic.     Right Ear: External ear normal.     Left Ear: External ear normal.     Nose: Nose normal.  Eyes:     Extraocular Movements: Extraocular movements intact.     Conjunctiva/sclera: Conjunctivae normal.     Pupils: Pupils are equal, round, and reactive to light.  Cardiovascular:     Rate and Rhythm: Normal rate and regular rhythm.     Heart sounds: Normal heart sounds.     Comments: ICD and pacemaker in left chest wall Pulmonary:     Effort: Pulmonary effort is normal. No respiratory distress.     Breath sounds: Normal breath sounds.  Abdominal:     General: There is no distension.     Palpations: Abdomen is soft. There is no mass.     Tenderness: There is no abdominal tenderness. There is no guarding.  Musculoskeletal:     Cervical back: Normal range of motion and neck supple.     Right lower leg: No edema.     Left lower leg: No edema.     Comments: PICC line in right upper extremity with dobutamine drip running  Skin:    General: Skin is warm and dry.  Neurological:     Mental Status: He is alert. Mental status is at baseline.  Psychiatric:        Mood and Affect: Mood normal.        Behavior: Behavior normal.     ED Results / Procedures / Treatments   Labs (all labs ordered are listed, but only abnormal results are displayed) Labs Reviewed  BASIC METABOLIC PANEL - Abnormal; Notable for the following components:      Result Value   Potassium 3.3 (*)    Chloride 96 (*)    Creatinine, Ser 1.68 (*)    GFR, Estimated 43 (*)    All other components within normal limits  CBC WITH DIFFERENTIAL/PLATELET - Abnormal; Notable for the following components:   RDW 17.7 (*)    Platelets 129 (*)    All other components within normal limits    EKG EKG Interpretation  Date/Time:  Wednesday August 20 2022 17:28:25 EDT Ventricular Rate:  79 PR  Interval:  276 QRS Duration: 154 QT Interval:  456 QTC Calculation: 522 R Axis:   -80 Text Interpretation: Sinus rhythm with 1st degree A-V block Left axis deviation Left bundle branch block Abnormal ECG When compared with ECG of 23-May-2022 11:26, No significant change since last tracing Confirmed by Margaretmary Eddy [phone]) on 08/20/2022 6:56:56 PM  Radiology DG Chest Portable  1 View  Result Date: 08/20/2022 CLINICAL DATA:  Possible episode of defibrillation EXAM: PORTABLE CHEST 1 VIEW COMPARISON:  Previous studies including the examination of 04/14/2022 FINDINGS: Transverse diameter of heart is increased. Central pulmonary vessels are prominent. There are no signs of alveolar pulmonary edema. There are no focal pulmonary infiltrates. There is no significant pleural effusion or pneumothorax. Pacemaker/defibrillator battery is seen in the left infraclavicular region with tip of lead in left ventricle. There is interval replacement of PICC line through the right upper extremity with its tip in course of superior vena cava. IMPRESSION: Cardiomegaly. Central pulmonary vessels are prominent without signs of pulmonary edema. There is no focal pulmonary consolidation. Other findings as described in the body of the report. Electronically Signed   By: Elmer Picker M.D.   On: 08/20/2022 19:35    Procedures Procedures   Medications Ordered in ED Medications  potassium chloride SA (KLOR-CON M) CR tablet 40 mEq (40 mEq Oral Given 08/20/22 2108)    ED Course/ Medical Decision Making/ A&P Clinical Course as of 08/20/22 2140  Wed Aug 20, 2022  1958 Medtronic has called. ICD is programmed off and is not currently detecting. Currently working as an Health visitor. Occasionally has had beats in the 130s over the past few days. Required pacing several days ago.  [RP]    Clinical Course User Index [RP] Fransico Meadow, MD                             Medical Decision Making Amount and/or Complexity  of Data Reviewed Radiology: ordered.  Risk Prescription drug management.   ***  {Document critical care time when appropriate:1} {Document review of labs and clinical decision tools ie heart score, Chads2Vasc2 etc:1}  {Document your independent review of radiology images, and any outside records:1} {Document your discussion with family members, caretakers, and with consultants:1} {Document social determinants of health affecting pt's care:1} {Document your decision making why or why not admission, treatments were needed:1} Final Clinical Impression(s) / ED Diagnoses Final diagnoses:  Chronic heart failure, unspecified heart failure type ([place])  Myoclonic jerking    Rx / DC Orders ED Discharge Orders     None

## 2022-09-01 ENCOUNTER — Ambulatory Visit: Payer: No Typology Code available for payment source | Admitting: Pulmonary Disease

## 2022-09-15 ENCOUNTER — Inpatient Hospital Stay (HOSPITAL_COMMUNITY)
Admission: RE | Admit: 2022-09-15 | Discharge: 2022-09-15 | Disposition: A | Discharge status: Home / Self Care | Payer: No Typology Code available for payment source | Source: Ambulatory Visit | Attending: Internal Medicine | Admitting: Internal Medicine

## 2022-09-15 DIAGNOSIS — I5022 Chronic systolic (congestive) heart failure: Secondary | ICD-10-CM

## 2022-09-15 NOTE — Progress Notes (Signed)
Patient did not show for appointment. Currently under Hospice Care.        ADVANCED HF CLINIC NOTE  Primary Care: [place]  HF Cardiologist: Dr. Gala Romney  HPI: Mr. Capuano is a 72 y.o. male with HFrEF (EF 20-25%, Echo 06/2021) 2/2 NICM with ICD placed 2018, HTN complicated by orthostatic hypotension, PAF (CHADSVASC 3) on chronic AC with eliquis, OSA on CPAP, chronic hypoxemic respiratory failure on 1L of O2 at home (since 06/2021) and CKD stage IIIa.   HFrEF [place] after patient had URI. He underwent left heart catheterization at that time and was told his coronary arteries were "clean".  On 12/26/222 had ICD (MDT) shock for an episode of ventricular fibrillation after failed ATP.   In 1/23 hospitalized with a/c HF. Echo EF 20-25%, severely dilated LV, G3 DD, severely dilated LA/RA, moderate MR and TR, mild AR. Not significantly changed from 12/2020 echo. LHC mild non obstructive CAD. ~50% mid and distal RCA. RHC: mean PAP 38, RA 9, PCWP 19 Fick 4.3/2.0  Zio 5/23 1. Sinus rhythm  2. 84 runs NSVT  3. Frequent PVCs (10.4%, 75508)   Zio 2/24 1. SR 2. 35 runs NSVT 3 PVCs 10.2%  PYP 5/23 mildly positive. SPEP negative. Genetic testing for TTR amyloid negative.   Echo 12/23 EF < 20% RV moderately down severe MR  CPX 9/23: Peak VO2: 13.9 (58% predicted peak VO2) VE/VCO2 slope:  34 Peak RER: 1.14   We saw him several times over the past 2 months to discuss possible VAD which he was initially interested in. Has been seen by Dr. Delia Chimes and also presented at [facility]. However after discussions with his family he has decided against proceeding.   Here for f/u. Says he is feeling pretty good. Able to all ADls without too much problem. Swelling improved. No CP, orthopnea or PND. Compliant with meds.   Cardiac Studies: - PYP (5/23): mildly positive  - R/LHC (1/23): mild non-obs CAD, 50% mid and distal RCA RA 9 PAP mean 38 PCWP 19  - Echo (1/23): EF  20-25%  - Echo (8/22): EF 20-25%, severe LV dysfunction, grade III DD, mildly reduced RV, mild to moderate MR  Past Medical History:  Diagnosis Date   Cardiac pacemaker    CHF (congestive heart failure) (HCC)    diagnosed in 2011   Chronic hypoxemic respiratory failure (HCC)    CKD (chronic kidney disease) stage 3, GFR 30-59 ml/min (HCC)    3a   COVID    Hypertension    NICM (nonischemic cardiomyopathy) (HCC)    OSA on CPAP    PAF (paroxysmal atrial fibrillation) (HCC)    on Eliquis   Presence of cardiac defibrillator    Current Outpatient Medications  Medication Sig Dispense Refill   albuterol (VENTOLIN HFA) 108 (90 Base) MCG/ACT inhaler Inhale 2 puffs into the lungs every 6 (six) hours as needed for wheezing.     amiodarone (PACERONE) 200 MG tablet Take 1 tablet (200 mg total) by mouth daily. 90 tablet 3   apixaban (ELIQUIS) 5 MG TABS tablet Take 1 tablet (5 mg total) by mouth 2 (two) times daily. 60 tablet 3   atorvastatin (LIPITOR) 40 MG tablet Take 1 tablet (40 mg total) by mouth daily. (Patient taking differently: Take 40 mg by mouth in the morning and at bedtime.) 60 tablet 1   carvedilol (COREG) 25 MG tablet Take 25 mg by mouth 2 (two) times daily with a meal.  doxycycline (VIBRA-TABS) 100 MG tablet Take 100 mg by mouth 2 (two) times daily.     empagliflozin (JARDIANCE) 25 MG TABS tablet Take 12.5 mg (1/2 tablet) daily. 45 tablet 3   levothyroxine (SYNTHROID) 25 MCG tablet Take 1 tablet (25 mcg total) by mouth daily. 90 tablet 1   lisinopril (ZESTRIL) 40 MG tablet Take 40 mg by mouth daily.     loratadine (CLARITIN) 10 MG tablet Take 10 mg by mouth daily.     Multiple Vitamin (MULTIVITAMIN WITH MINERALS) TABS tablet Take 1 tablet by mouth daily.     omeprazole (PRILOSEC) 40 MG capsule Take 40 mg by mouth in the morning and at bedtime.     OXYGEN Inhale into the lungs as needed.     torsemide (DEMADEX) 20 MG tablet Take 4 tablets (80 mg total) by mouth every morning. 120  tablet 5   Vitamin D, Cholecalciferol, 10 MCG (400 UNIT) CAPS Take 800 Units by mouth daily.     Glucerna (GLUCERNA) LIQD Take 237 mLs by mouth daily in the afternoon. (Patient not taking: Reported on 07/15/2022)     potassium chloride SA (KLOR-CON M) 20 MEQ tablet Take 2 tablets (40 mEq total) by mouth 2 (two) times daily. 360 tablet 3   Tiotropium Bromide Monohydrate (SPIRIVA RESPIMAT) 2.5 MCG/ACT AERS Inhale 2 puffs into the lungs daily. (Patient not taking: Reported on 07/15/2022) 4 g 11   No current facility-administered medications for this encounter.   Allergies  Allergen Reactions   Iodinated Contrast Media Shortness Of Breath, Rash and Cough   Social History   Socioeconomic History   Marital status: Married    Spouse name: Not on file   Number of children: Not on file   Years of education: Not on file   Highest education level: Not on file  Occupational History   Not on file  Tobacco Use   Smoking status: Never   Smokeless tobacco: Never  Vaping Use   Vaping Use: Never used  Substance and Sexual Activity   Alcohol use: Never   Drug use: Never   Sexual activity: Not Currently  Other Topics Concern   Not on file  Social History Narrative   Not on file   Social Determinants of Health   Financial Resource Strain: Not on file  Food Insecurity: No Food Insecurity (04/21/2019)   Hunger Vital Sign    Worried About Running Out of Food in the Last Year: Never true    Ran Out of Food in the Last Year: Never true  Transportation Needs: No Transportation Needs (04/21/2019)   PRAPARE - Administrator, Civil Service (Medical): No    Lack of Transportation (Non-Medical): No  Physical Activity: Inactive (04/21/2019)   Exercise Vital Sign    Days of Exercise per Week: 0 days    Minutes of Exercise per Session: 0 min  Stress: No Stress Concern Present (04/21/2019)   [employer] of Occupational Health - Occupational Stress Questionnaire    Feeling of Stress :  Only a little  Social Connections: Moderately Integrated (04/21/2019)   Social Connection and Isolation Panel [NHANES]    Frequency of Communication with Friends and Family: Three times a week    Frequency of Social Gatherings with Friends and Family: Once a week    Attends Religious Services: More than 4 times per year    Active Member of [employer] or Organizations: No    Attends Banker Meetings: Never    Marital  Status: Married  Catering manager Violence: Not At Risk (04/21/2019)   Humiliation, Afraid, Rape, and Kick questionnaire    Fear of Current or Ex-Partner: No    Emotionally Abused: No    Physically Abused: No    Sexually Abused: No   Family History  Problem Relation Age of Onset   Heart attack Mother    Heart attack Father    BP 104/73 Comment: map 84  Pulse 84   Temp 97.7 F (36.5 C) (Oral)   Wt 82.6 kg (182 lb)   SpO2 100%   BMI 27.67 kg/m   Wt Readings from Last 3 Encounters:  07/15/22 82.6 kg (182 lb)  06/26/22 80.7 kg (178 lb)  06/24/22 82.5 kg (181 lb 12.8 oz)   PHYSICAL EXAM: General:  thin male. No resp difficulty HEENT: normal Neck: supple. no JVD. Carotids 2+ bilat; no bruits. No lymphadenopathy or thryomegaly appreciated. Cor: PMI nondisplaced. Regular rate & rhythm. No rubs, gallops or murmurs. Lungs: clear Abdomen: soft, nontender, nondistended. No hepatosplenomegaly. No bruits or masses. Good bowel sounds. Extremities: no cyanosis, clubbing, rash, edema Neuro: alert & orientedx3, cranial nerves grossly intact. moves all 4 extremities w/o difficulty. 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  - Cath (1/23): non-obstructive CAD - s/p MDT ICD. - Echo 05/23/22 EF < 20% RV moderately down severe MR - CPX 9/23: Peak VO2: 13.9 (58% predicted peak VO2) VE/VCO2 slope:  34 Peak RER: 1.14  - Mildly improved NYHA III-IIIb - Volume status  ok  - Off b-blocker due to low output - Continue Jardiance 12.5 mg bid. No GU symptoms. - Off BiDil with low BP. - Etiology of CM remains unclear. On Zio he has frequent PVCs and PYP also appears positive. - Continue amio 200 daily to suppress PVCs. Repeat Zio 2/24 PVCs 10.2% - Multiple myeloma panel & urine immunofixation negative. - Genetic testing negative for TTR amyloid negative - Although PYP scan is marginally positive, echo does not look like TTR and genetic testing is negative. Unable to get cMRI due to ICD. EF has not improved with attempts at PVC suppression  - CPX c/w with advanced/end-stage HF.  - Has been seen by Dr. Delia Chimes and also presented at [facility]. Now has [facility] approval to proceed with VAD w/u. However he has d/w his family and now has decided not to proceed.  - labs today - Will repeat echo to see if EF improving - I will also repeat PYP to see if we should consider tafamadis  2. Paroxysmal atrial fibrillation - In SR today - On amio - Continue Eliquis 5 mg bid. No bleeding  3. CKD stage 3b - Baseline SCr 1.7-1.9 - Repeat labs today  4. OSA on CPAP  - Follows with Dr. Mayford Knife.  5. CAD  - non obstructive on [facility] 1/23. - No s/s angina - No ASA with Eliquis  - Continue atorvastatin.  6. 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 Frequent PVCs - repeat zio 2/24 10.2% PVCs - continue amio   8. Elevated TSH and T4 - likely due to amio - has been referred to Endo    Arvilla Meres, MD  9:30 AM

## 2022-12-26 ENCOUNTER — Ambulatory Visit: Payer: No Typology Code available for payment source | Admitting: Internal Medicine

## 2022-12-26 NOTE — Progress Notes (Deleted)
Name: Evan Serrano  MRN/ DOB: [id number], Aug 11, 1950    Age/ Sex: 72 y.o., male    PCP: Bensimhon, Bevelyn Buckles, MD   Reason for Endocrinology Evaluation: Hypothyroidism     Date of Initial Endocrinology Evaluation: 06/26/2022    HPI: Evan Serrano is a 72 y.o. male with a past medical history of OSA, CHF, NICM ( ICD 2018) , HTN and PAF. The patient presented for initial endocrinology clinic visit on 06/26/2022 for consultative assistance with his hypothyroidism.   Pt has been noted with elevated TSH since 05/2022 at 10.587 uIU/mL   Pt has been on Amiodarone since 10/2021   He was seen by cardiothoracic surgeon for evaluation of LVAD 06/12/2022    No FH of thyroid disease    Patient was started on levothyroxine on his initial visit to our clinic with a TSH of 14.738 uIU/mL   SUBJECTIVE:    Today (12/26/22):  Evan Serrano is here for follow-up on hypothyroidism.  Weight has been fluctuating  Denies constipation except for once recently  Has decreased appetite Denies local neck swelling Denies palpitations  Denies tremors  Has been weak, has a walker since 2020 but did not need it until last month     HISTORY:  Past Medical History:  Past Medical History:  Diagnosis Date   Cardiac pacemaker    CHF (congestive heart failure) (HCC)    diagnosed in 2011   Chronic hypoxemic respiratory failure (HCC)    CKD (chronic kidney disease) stage 3, GFR 30-59 ml/min (HCC)    3a   COVID    Hypertension    NICM (nonischemic cardiomyopathy) (HCC)    OSA on CPAP    PAF (paroxysmal atrial fibrillation) (HCC)    on Eliquis   Presence of cardiac defibrillator    Past Surgical History:  Past Surgical History:  Procedure Laterality Date   PACEMAKER IMPLANT     RIGHT/LEFT HEART CATH AND CORONARY ANGIOGRAPHY N/A 06/14/2021   Procedure: RIGHT/LEFT HEART CATH AND CORONARY ANGIOGRAPHY;  Surgeon: Tonny Bollman, MD;  [facility] INVASIVE CV LAB;  Service: Cardiovascular;   Laterality: N/A;    Social History:  reports that he has never smoked. He has never used smokeless tobacco. He reports that he does not drink alcohol and does not use drugs. Family History: family history includes Heart attack in his father and mother.   HOME MEDICATIONS: Allergies as of 12/26/2022       Reactions   Iodinated Contrast Media Shortness Of Breath, Rash, Cough        Medication List        Accurate as of December 26, 2022  7:23 AM. If you have any questions, ask your nurse or doctor.          albuterol 108 (90 Base) MCG/ACT inhaler Commonly known as: VENTOLIN HFA Inhale 2 puffs into the lungs every 6 (six) hours as needed for wheezing.   amiodarone 200 MG tablet Commonly known as: PACERONE Take 1 tablet (200 mg total) by mouth daily.   atorvastatin 40 MG tablet Commonly known as: LIPITOR Take 1 tablet (40 mg total) by mouth daily. What changed: when to take this   carvedilol 25 MG tablet Commonly known as: COREG Take 25 mg by mouth 2 (two) times daily with a meal.   doxycycline 100 MG tablet Commonly known as: VIBRA-TABS Take 100 mg by mouth 2 (two) times daily.   Eliquis 5 MG Tabs tablet Generic drug: apixaban Take 1 tablet (  5 mg total) by mouth 2 (two) times daily.   empagliflozin 25 MG Tabs tablet Commonly known as: Jardiance Take 12.5 mg (1/2 tablet) daily.   Glucerna Liqd Take 237 mLs by mouth daily in the afternoon.   levothyroxine 25 MCG tablet Commonly known as: SYNTHROID Take 1 tablet (25 mcg total) by mouth daily.   lisinopril 40 MG tablet Commonly known as: ZESTRIL Take 40 mg by mouth daily.   loratadine 10 MG tablet Commonly known as: CLARITIN Take 10 mg by mouth daily.   multivitamin with minerals Tabs tablet Take 1 tablet by mouth daily.   omeprazole 40 MG capsule Commonly known as: PRILOSEC Take 40 mg by mouth in the morning and at bedtime.   OXYGEN Inhale into the lungs as needed.   potassium chloride SA 20 MEQ  tablet Commonly known as: KLOR-CON M Take 2 tablets (40 mEq total) by mouth daily.   Spiriva Respimat 2.5 MCG/ACT Aers Generic drug: Tiotropium Bromide Monohydrate Inhale 2 puffs into the lungs daily.   torsemide 20 MG tablet Commonly known as: DEMADEX Take 4 tablets (80 mg total) by mouth every morning.   Vitamin D (Cholecalciferol) 10 MCG (400 UNIT) Caps Take 800 Units by mouth daily.          REVIEW OF SYSTEMS: A comprehensive ROS was conducted with the patient and is negative except as per HPI    OBJECTIVE:  VS: There were no vitals taken for this visit.   Wt Readings from Last 3 Encounters:  08/20/22 168 lb (76.2 kg)  07/15/22 182 lb (82.6 kg)  06/26/22 178 lb (80.7 kg)     EXAM: General: Pt appears well and is in NAD  Eyes: External eye exam normal without stare, lid lag or exophthalmos.  Neck: General: Supple without adenopathy. Thyroid: Thyroid size normal.  No goiter or nodules appreciated.   Lungs: Clear with good BS bilat   Heart: Auscultation: RRR.  Abdomen: Normoactive bowel sounds, soft, nontender, without masses or organomegaly palpable  Extremities:  BL LE: No pretibial edema normal ROM and strength.  Mental Status: Judgment, insight: Intact Orientation: Oriented to time, place, and person Mood and affect: No depression, anxiety, or agitation     DATA REVIEWED:     Latest Reference Range & Units 05/23/22 11:27 05/23/22 11:31 06/10/22 09:45  TSH 0.350 - 4.500 uIU/mL 10.587 (H)  14.738 (H)  Triiodothyronine,Free,Serum 2.0 - 4.4 pg/mL 2.7  2.1  T4,Free(Direct) 0.61 - 1.12 ng/dL  0.98 (H) 1.19 (H)    ASSESSMENT/PLAN/RECOMMENDATIONS:   Hypothyroidism:  -Patient with weakness that has been worsening over the past month, and clear how much of this is related to his thyroid versus CHF versus medication side effect -I have recommended starting him on LT-for replacement - Pt educated extensively on the correct way to take levothyroxine (first  thing in the morning with water, 30 minutes before eating or taking other medications). - Pt encouraged to double dose the following day if she were to miss a dose given long half-life of levothyroxine.   Medications : Start levothyroxine 25 mcg daily  Labs in 6 weeks Follow-up in 4 months   Signed electronically by: Lyndle Herrlich, MD  [facility] Endocrinology  [facility] [address] [place], [place] [address] Phone: [phone] FAX: [phone]   CC: Dolores Patty, MD [address] [place] [address] Phone: [phone] Fax: [phone]   Return to Endocrinology clinic as below: Future Appointments  Date Time Provider Department Center  12/26/2022 12:10 PM , Konrad Dolores, MD [id number] None

## 2023-02-21 ENCOUNTER — Emergency Department (HOSPITAL_BASED_OUTPATIENT_CLINIC_OR_DEPARTMENT_OTHER)
Admit: 2023-02-21 | Discharge: 2023-02-21 | Disposition: A | Discharge status: Home / Self Care | Payer: No Typology Code available for payment source | Attending: Emergency Medicine | Admitting: Emergency Medicine

## 2023-02-21 ENCOUNTER — Emergency Department (HOSPITAL_COMMUNITY): Payer: No Typology Code available for payment source

## 2023-02-21 ENCOUNTER — Emergency Department (HOSPITAL_COMMUNITY)
Admission: EM | Admit: 2023-02-21 | Discharge: 2023-02-22 | Disposition: A | Discharge status: Home / Self Care | Payer: No Typology Code available for payment source | Attending: Emergency Medicine | Admitting: Emergency Medicine

## 2023-02-21 ENCOUNTER — Encounter (HOSPITAL_COMMUNITY): Payer: Self-pay

## 2023-02-21 ENCOUNTER — Other Ambulatory Visit: Payer: Self-pay

## 2023-02-21 DIAGNOSIS — Z79899 Other long term (current) drug therapy: Secondary | ICD-10-CM | POA: Diagnosis not present

## 2023-02-21 DIAGNOSIS — Z7901 Long term (current) use of anticoagulants: Secondary | ICD-10-CM | POA: Diagnosis not present

## 2023-02-21 DIAGNOSIS — Y712 Prosthetic and other implants, materials and accessory cardiovascular devices associated with adverse incidents: Secondary | ICD-10-CM | POA: Diagnosis not present

## 2023-02-21 DIAGNOSIS — M79601 Pain in right arm: Secondary | ICD-10-CM | POA: Diagnosis present

## 2023-02-21 DIAGNOSIS — M7989 Other specified soft tissue disorders: Secondary | ICD-10-CM

## 2023-02-21 DIAGNOSIS — T82898A Other specified complication of vascular prosthetic devices, implants and grafts, initial encounter: Secondary | ICD-10-CM | POA: Insufficient documentation

## 2023-02-21 DIAGNOSIS — I509 Heart failure, unspecified: Secondary | ICD-10-CM | POA: Diagnosis not present

## 2023-02-21 DIAGNOSIS — N1831 Chronic kidney disease, stage 3a: Secondary | ICD-10-CM | POA: Diagnosis not present

## 2023-02-21 DIAGNOSIS — Z950 Presence of cardiac pacemaker: Secondary | ICD-10-CM | POA: Diagnosis not present

## 2023-02-21 DIAGNOSIS — I130 Hypertensive heart and chronic kidney disease with heart failure and stage 1 through stage 4 chronic kidney disease, or unspecified chronic kidney disease: Secondary | ICD-10-CM | POA: Diagnosis not present

## 2023-02-21 MED ORDER — ALTEPLASE 2 MG IJ SOLR
2.0000 mg | Freq: Once | INTRAMUSCULAR | Status: AC
  Administered 2023-02-21: 2 mg
  Filled 2023-02-21: qty 2

## 2023-02-21 MED ORDER — ALTEPLASE 2 MG IJ SOLR: 4.0000 mg | Freq: Once | INTRAMUSCULAR | Status: DC

## 2023-02-21 MED ORDER — DOBUTAMINE-DEXTROSE 4-5 MG/ML-% IV SOLN
2.5000 ug/kg/min | INTRAVENOUS | Status: DC
  Administered 2023-02-21: 2.5 ug/kg/min via INTRAVENOUS
  Filled 2023-02-21: qty 250

## 2023-02-21 NOTE — ED Triage Notes (Signed)
Pt BIB EMS for PICC line occulusion. Pt is a hospice pt and pts pump with medicine keeps saying occulusion. Hospice recommended pt come to hospital. Site is clean, pt reports pain. Axox4. Pt is supposed to be on dobutamine.

## 2023-02-21 NOTE — ED Provider Notes (Signed)
[place] EMERGENCY DEPARTMENT AT [facility] Provider Note   CSN: [id number] Arrival date & time: 02/21/23  1028     History {Add pertinent medical, surgical, social history, OB history to HPI:1} Chief Complaint  Patient presents with   Vascular Access Problem    Mehkai Isaguirre is a 72 y.o. male with PMH as listed below who is BIB EMS for PICC line occulusion. Pt is a hospice pt and pts pump with medicine keeps saying occulusion. Hospice recommended pt come to hospital. Site is clean, pt reports pain. Pt is supposed to be on dobutamine.     Past Medical History:  Diagnosis Date   Cardiac pacemaker    CHF (congestive heart failure) (HCC)    diagnosed in 2011   Chronic hypoxemic respiratory failure (HCC)    CKD (chronic kidney disease) stage 3, GFR 30-59 ml/min (HCC)    3a   COVID    Hypertension    NICM (nonischemic cardiomyopathy) (HCC)    OSA on CPAP    PAF (paroxysmal atrial fibrillation) (HCC)    on Eliquis   Presence of cardiac defibrillator        Home Medications Prior to Admission medications   Medication Sig Start Date End Date Taking? Authorizing Provider  albuterol (VENTOLIN HFA) 108 (90 Base) MCG/ACT inhaler Inhale 2 puffs into the lungs every 6 (six) hours as needed for wheezing.    [provider]  amiodarone (PACERONE) 200 MG tablet Take 1 tablet (200 mg total) by mouth daily. 12/17/21   Milford, Anderson Malta, FNP  apixaban (ELIQUIS) 5 MG TABS tablet Take 1 tablet (5 mg total) by mouth 2 (two) times daily. 06/17/21   Kathlen Mody, MD  atorvastatin (LIPITOR) 40 MG tablet Take 1 tablet (40 mg total) by mouth daily. Patient taking differently: Take 40 mg by mouth in the morning and at bedtime. 06/18/21   Kathlen Mody, MD  carvedilol (COREG) 25 MG tablet Take 25 mg by mouth 2 (two) times daily with a meal.    [provider]  doxycycline (VIBRA-TABS) 100 MG tablet Take 100 mg by mouth 2 (two) times daily. 06/25/22   [provider]  empagliflozin (JARDIANCE) 25 MG TABS tablet Take 12.5 mg (1/2 tablet) daily. 06/24/22   Bensimhon, Bevelyn Buckles, MD  Glucerna (GLUCERNA) LIQD Take 237 mLs by mouth daily in the afternoon. Patient not taking: Reported on 07/15/2022    [provider]  levothyroxine (SYNTHROID) 25 MCG tablet Take 1 tablet (25 mcg total) by mouth daily. 06/26/22   Shamleffer, Konrad Dolores, MD  lisinopril (ZESTRIL) 40 MG tablet Take 40 mg by mouth daily.    [provider]  loratadine (CLARITIN) 10 MG tablet Take 10 mg by mouth daily.    [provider]  Multiple Vitamin (MULTIVITAMIN WITH MINERALS) TABS tablet Take 1 tablet by mouth daily. 01/14/21   Elgergawy, Leana Roe, MD  omeprazole (PRILOSEC) 40 MG capsule Take 40 mg by mouth in the morning and at bedtime.    [provider]  OXYGEN Inhale into the lungs as needed.    [provider]  potassium chloride SA (KLOR-CON M) 20 MEQ tablet Take 2 tablets (40 mEq total) by mouth daily. 07/29/22   Bensimhon, Bevelyn Buckles, MD  Tiotropium Bromide Monohydrate (SPIRIVA RESPIMAT) 2.5 MCG/ACT AERS Inhale 2 puffs into the lungs daily. Patient not taking: Reported on 07/15/2022 01/29/22   Martina Sinner, MD  torsemide (DEMADEX) 20 MG tablet Take 4 tablets (80 mg  total) by mouth every morning. 10/18/21   Milford, Anderson Malta, FNP  Vitamin D, Cholecalciferol, 10 MCG (400 UNIT) CAPS Take 800 Units by mouth daily.    [provider]      Allergies    Iodinated contrast media    Review of Systems   Review of Systems A 10 point review of systems was performed and is negative unless otherwise reported in HPI.  Physical Exam Updated Vital Signs BP 90/75   Pulse 63   Temp 97.7 F (36.5 C) (Oral)   Resp 16   Ht 5\' 8"  (1.727 m)   Wt 63.5 kg   SpO2 100%   BMI 21.29 kg/m  Physical Exam General: Normal appearing {Desc; male/male:11659}, lying in bed.  HEENT: PERRLA, Sclera anicteric, MMM, trachea midline.   Cardiology: RRR, no murmurs/rubs/gallops. BL radial and DP pulses equal bilaterally.  Resp: Normal respiratory rate and effort. CTAB, no wheezes, rhonchi, crackles.  Abd: Soft, non-tender, non-distended. No rebound tenderness or guarding.  GU: Deferred. MSK: No peripheral edema or signs of trauma. Extremities without deformity or TTP. No cyanosis or clubbing. Skin: warm, dry. No rashes or lesions. Back: No CVA tenderness Neuro: A&Ox4, CNs II-XII grossly intact. MAEs. Sensation grossly intact.  Psych: Normal mood and affect.   ED Results / Procedures / Treatments   Labs (all labs ordered are listed, but only abnormal results are displayed) Labs Reviewed - No data to display  EKG None  Radiology No results found.  Procedures Procedures  {Document cardiac monitor, telemetry assessment procedure when appropriate:1}  Medications Ordered in ED Medications  alteplase (CATHFLO ACTIVASE) injection 2 mg (has no administration in time range)    ED Course/ Medical Decision Making/ A&P                          Medical Decision Making Risk Prescription drug management.    This patient presents to the ED for concern of ***, this involves an extensive number of treatment options, and is a complaint that carries with it a high risk of complications and morbidity.  I considered the following differential and admission for this acute, potentially life threatening condition.   MDM:    ***     Labs: I Ordered, and personally interpreted labs.  The pertinent results include:  ***  Imaging Studies ordered: I ordered imaging studies including *** I independently visualized and interpreted imaging. I agree with the radiologist interpretation  Additional history obtained from ***.  External records from outside source obtained and reviewed including ***  Cardiac Monitoring: The patient was maintained on a cardiac monitor.  I personally viewed and interpreted the cardiac monitored  which showed an underlying rhythm of: ***  Reevaluation: After the interventions noted above, I reevaluated the patient and found that they have :{resolved/improved/worsened:23923::"improved"}  Social Determinants of Health: ***  Disposition:  ***  Co morbidities that complicate the patient evaluation  Past Medical History:  Diagnosis Date   Cardiac pacemaker    CHF (congestive heart failure) (HCC)    diagnosed in 2011   Chronic hypoxemic respiratory failure (HCC)    CKD (chronic kidney disease) stage 3, GFR 30-59 ml/min (HCC)    3a   COVID    Hypertension    NICM (nonischemic cardiomyopathy) (HCC)    OSA on CPAP    PAF (paroxysmal atrial fibrillation) (HCC)    on Eliquis   Presence of cardiac defibrillator      Medicines Meds  ordered this encounter  Medications   alteplase (CATHFLO ACTIVASE) injection 2 mg    I have reviewed the patients home medicines and have made adjustments as needed  Problem List / ED Course: Problem List Items Addressed This Visit   None        {Document critical care time when appropriate:1} {Document review of labs and clinical decision tools ie heart score, Chads2Vasc2 etc:1}  {Document your independent review of radiology images, and any outside records:1} {Document your discussion with family members, caretakers, and with consultants:1} {Document social determinants of health affecting pt's care:1} {Document your decision making why or why not admission, treatments were needed:1}  This note was created using dictation software, which may contain spelling or grammatical errors.

## 2023-02-21 NOTE — ED Notes (Signed)
Order placed for IV team consult now that X-ray has resulted.

## 2023-02-21 NOTE — ED Provider Notes (Signed)
3:57 PM Care assumed from Dr. Jearld Fenton.  At time of transfer of care, patient is awaiting management by the PICC line team to treat the PICC line problem that the patient needs for dobutamine infusion.  Anticipate disposition based on their recommendations.  9:21 PM I was asked by IV team to order peripheral dobutamine to see if they can flush tPA and the lines.  They will let me know if it worked and if not, we will get a plan from them to see if patient is to be admitted for replacement.  11:20 PM Patient just had tPA placed in the lines.  He is getting dobutamine through the other peripheral IV.  Blood pressures are soft but patient is asymptomatic now.  Plan of care is to reassess if that is functioning in 2 hours.  If it is not, he will need to be admitted for PICC line replacement.  Care will be transferred to oncoming team to wait for reassessment in 2 hours.     Evan Serrano, Canary Brim, MD 02/21/23 [phone]

## 2023-02-21 NOTE — Progress Notes (Signed)
Per pt, PICC has had blood return off and on this week and after dressing change yesterday there has been no blood return in red lumen and pump occlusion alerts were occurring often while infusing into red lumen.  HH RN moved infusion to purple lumen and pump is not showing occlusion at this time.  Upon assessment it was noted that exposed catheter was 1-31mm, almost to hub.  Dressing change performed and line pulled to 0, still no blood return.  TPA instilled in red lumen.

## 2023-02-21 NOTE — ED Notes (Signed)
IV team at bedside 

## 2023-02-21 NOTE — Progress Notes (Signed)
Right upper extremity venous duplex has been completed. Preliminary results can be found in CV Proc through chart review.  Results were given to Dr. Jearld Fenton.  02/21/23 1:50 PM Olen Cordial RVT

## 2023-02-21 NOTE — ED Provider Notes (Signed)
11:30 PM Assumed care from Dr. Rush Landmark, please see their note for full history, physical and decision making until this point. In brief this is a 72 y.o. year old male who presented to the ED tonight with Vascular Access Problem     Getting PICC TPA'ed, pending whether it works. If not then admit. If so, d/c.   PICC line works now.  Dobutamine infusing well.  I was asked by the nurse to take a look at the patient as she states he is having episodes of bradycardia.  In review of the monitor I do not see any heart rate below 60.  I suspect when it same bradycardia is just not picking up some of the QRS is because he does not have enough amplitude.  I suspect this is related to his underlying cardiomyopathy.  Blood pressures are stable.  Patient is asymptomatic otherwise.  Stable for discharge at this time.  Discharge instructions, including strict return precautions for new or worsening symptoms, given. Patient and/or family verbalized understanding and agreement with the plan as described.   Labs, studies and imaging reviewed by myself and considered in medical decision making if ordered. Imaging interpreted by radiology.  Labs Reviewed - No data to display  DG Chest Portable 1 View  Final Result    DG CHEST PORT 1 VIEW  Final Result    UE VENOUS DUPLEX (7am - 7pm)  Final Result      No follow-ups on file.    Glessie Eustice, Barbara Cower, MD 02/22/23 [phone]

## 2024-01-05 IMAGING — DX DG CHEST 2V
2 series · 2 of 2 positions shown · non-contrast
Comparison: August 01, 2021.

CLINICAL DATA: Shortness of breath.

EXAM:
CHEST - 2 VIEW

[chest pa]
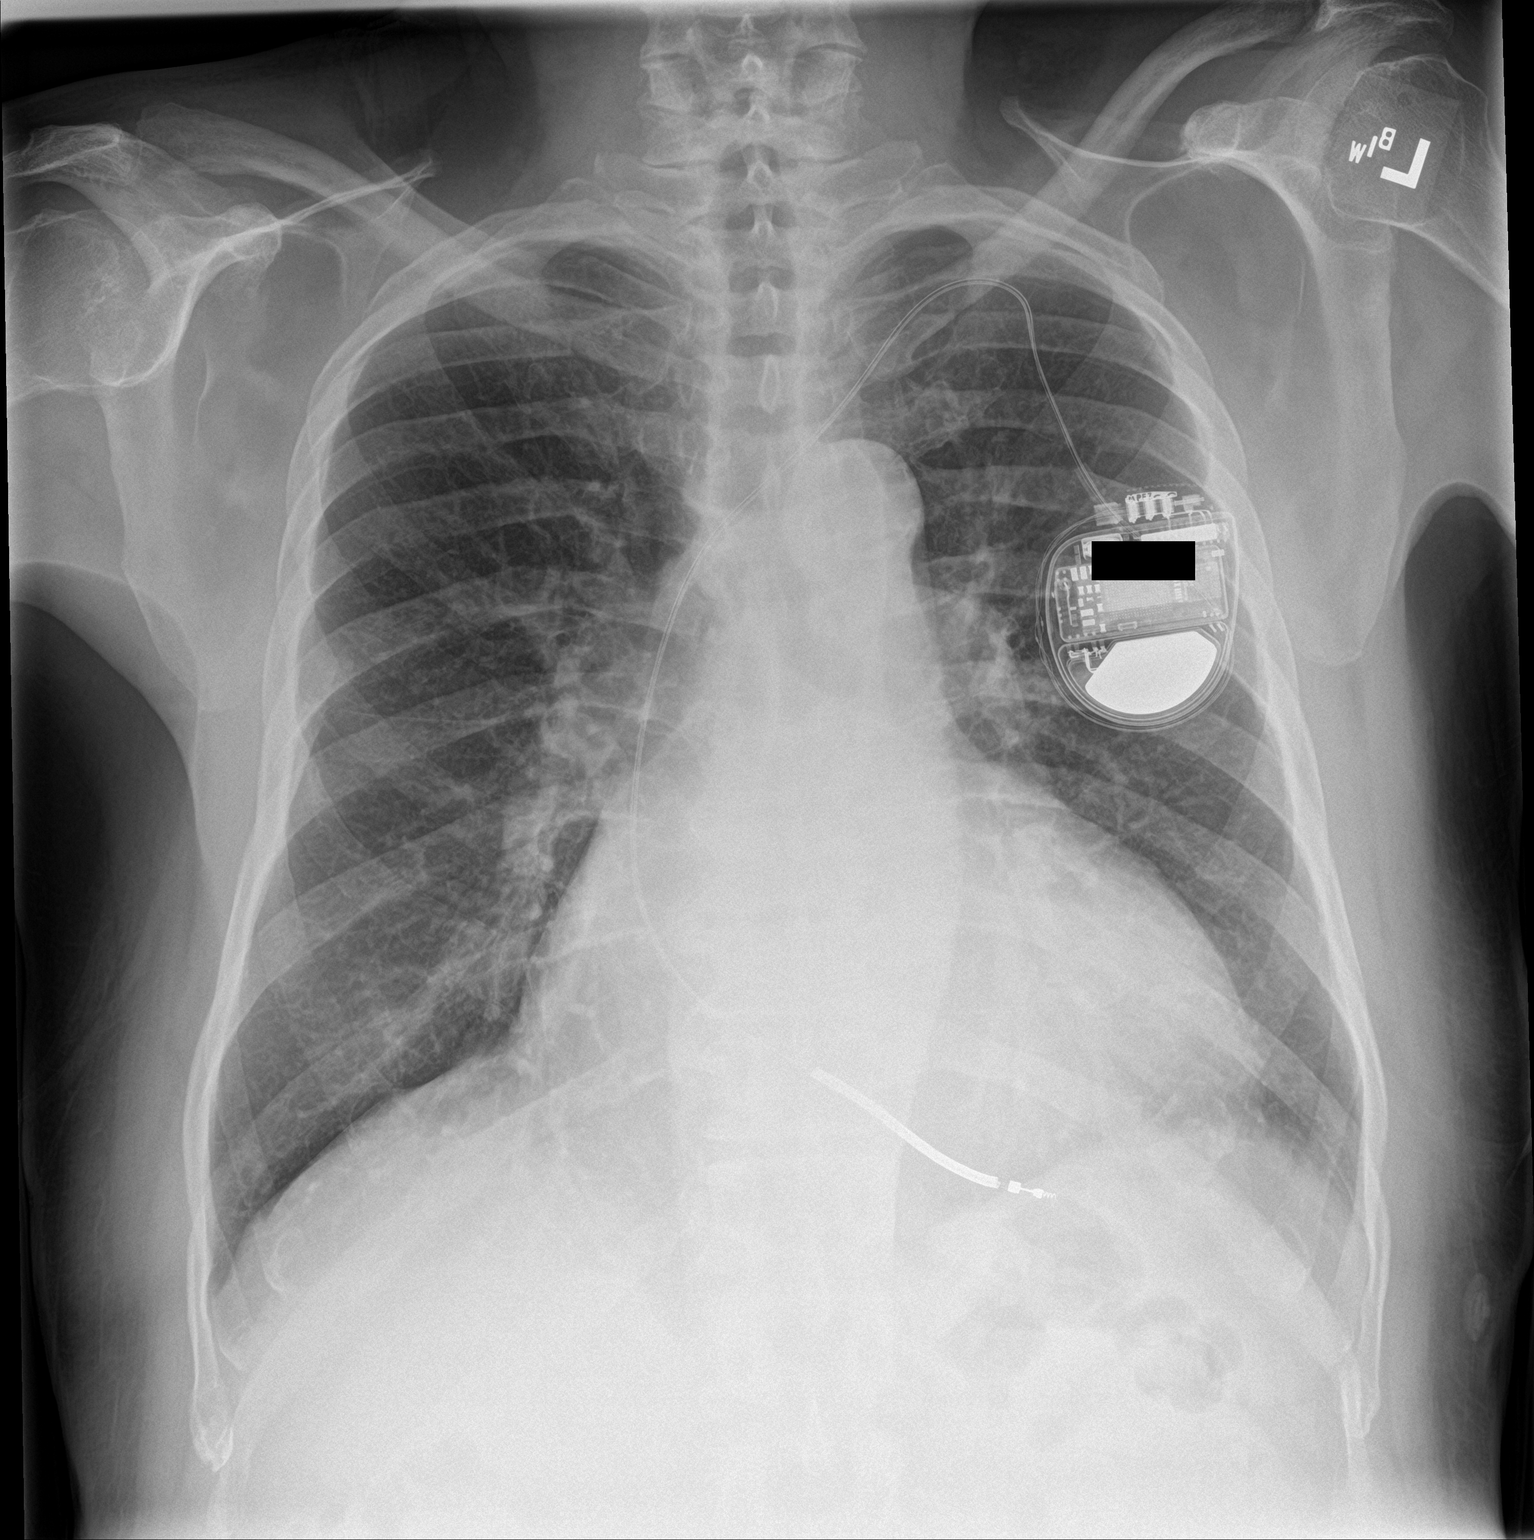

[chest lat]
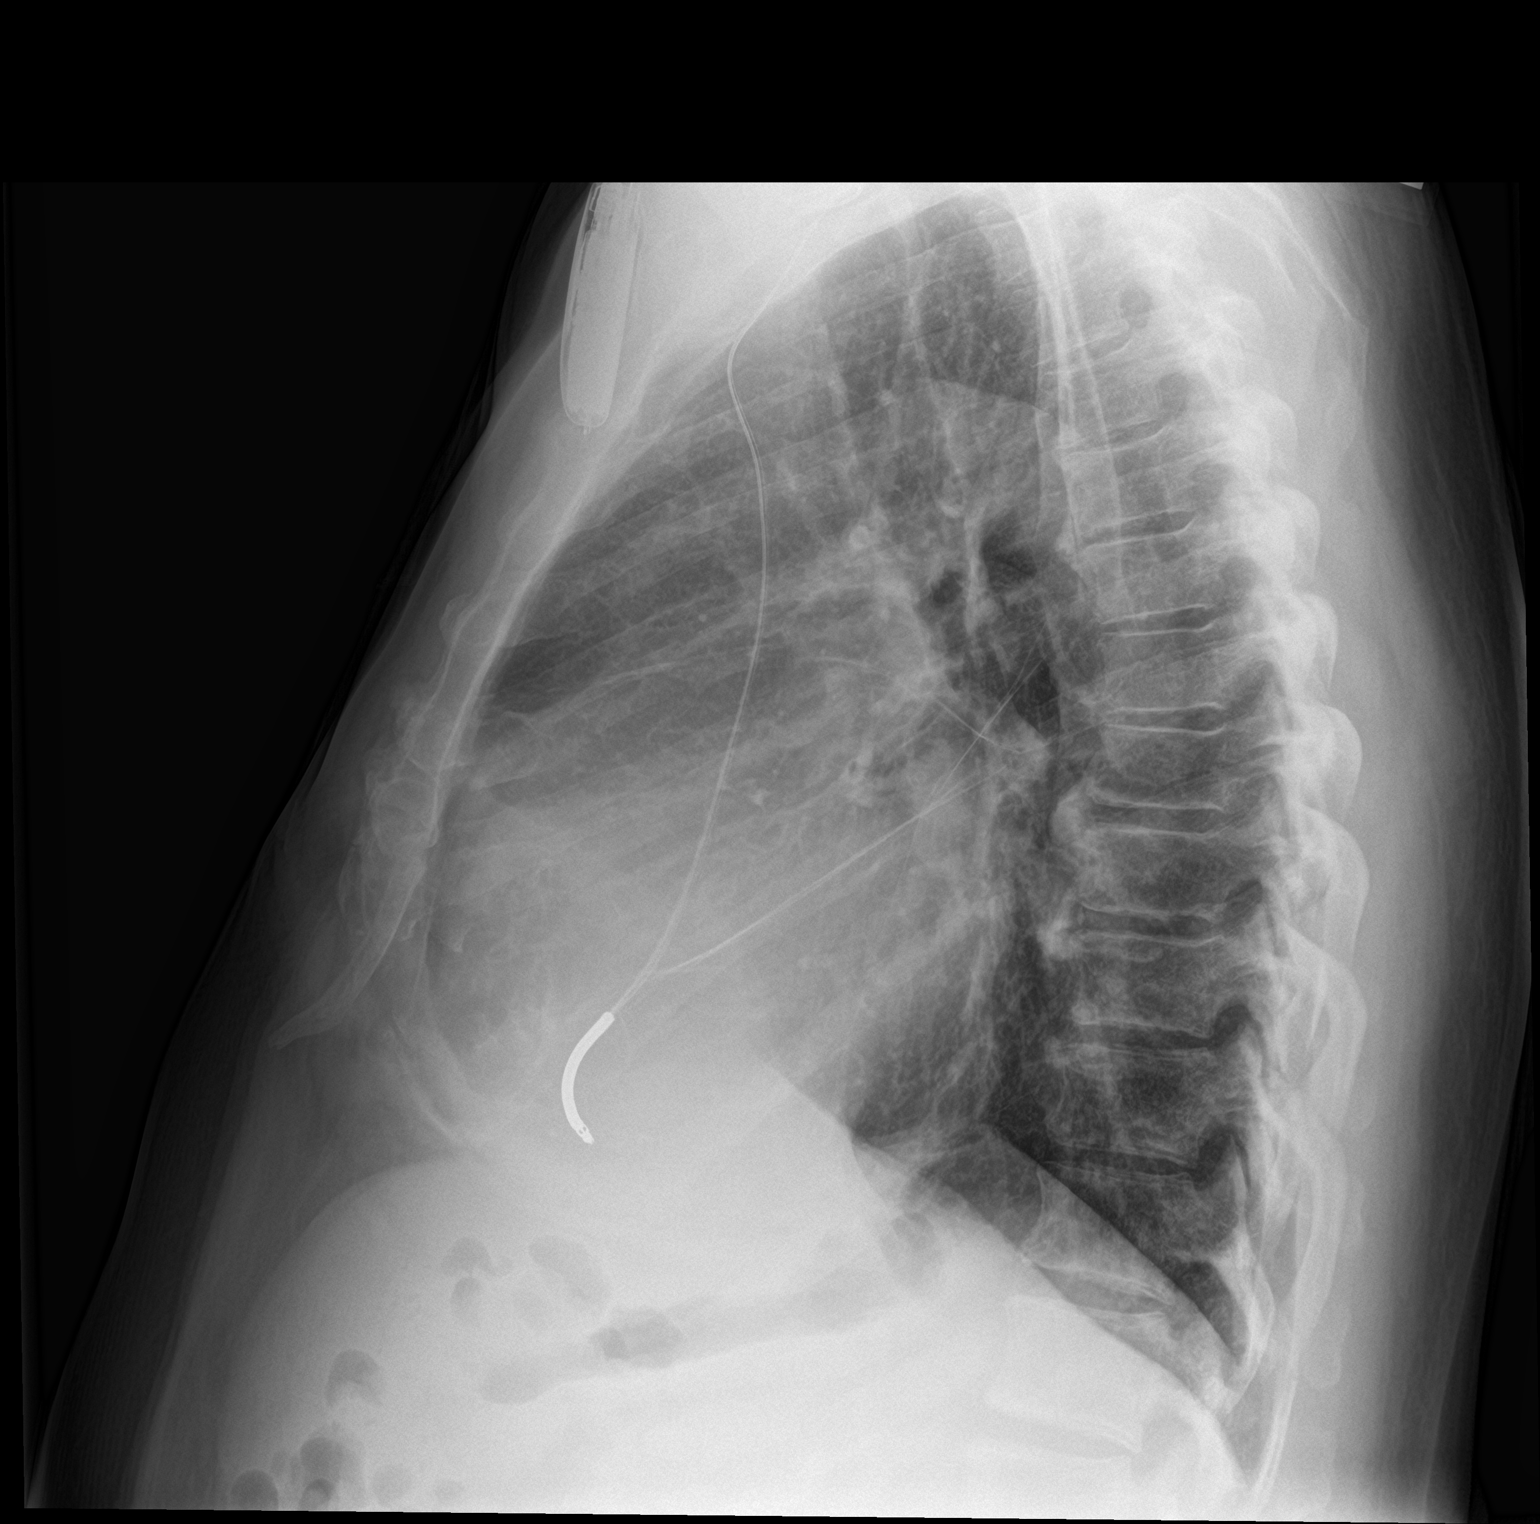

[2 of 2 positions shown; findings below may reference images not displayed]

FINDINGS: Stable cardiomegaly. Single lead left-sided defibrillator is
unchanged in position. Both lungs are clear. The visualized skeletal
structures are unremarkable.
IMPRESSION: No active cardiopulmonary disease.

## 2024-01-05 IMAGING — CT CT ABD-PELV W/O CM
3 of 4 series · 11 of 46 positions shown, 18 images · non-contrast
Comparison: None Available.

CLINICAL DATA: Abdominal pain, acute, nonlocalized



[Series 3: ap without · axial · non-contrast · 0.92mm/px · z∈[-150,+225]mm · 7 of 101 slices shown, 12 images]
[im 13/101  soft-tissue]
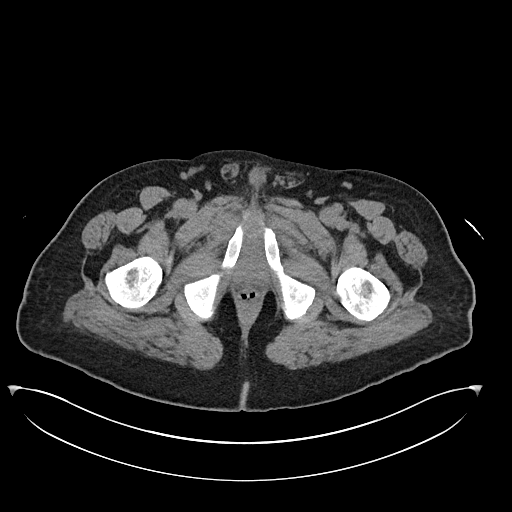
[im 13/101  bone]
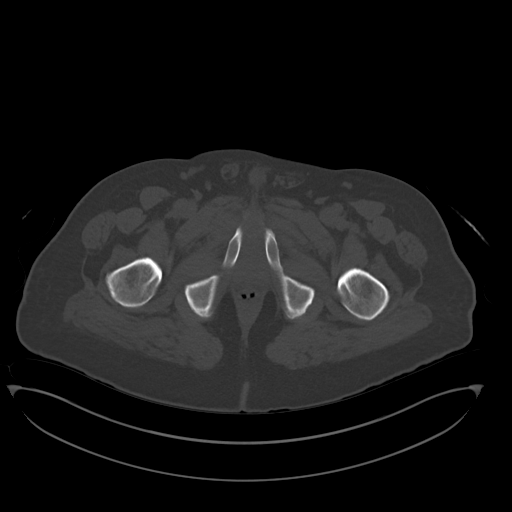
[im 26/101  soft-tissue]
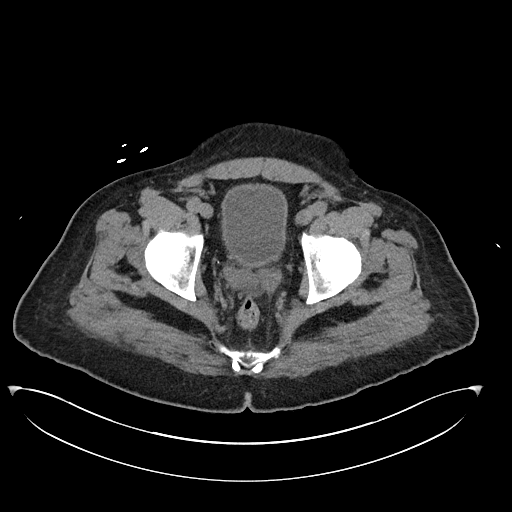
[im 38/101  soft-tissue]
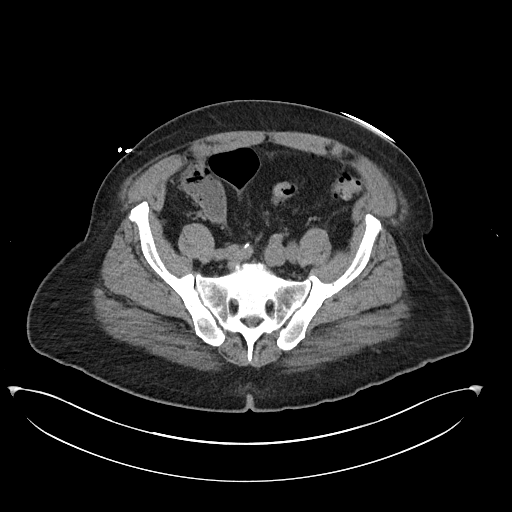
[im 51/101  soft-tissue]
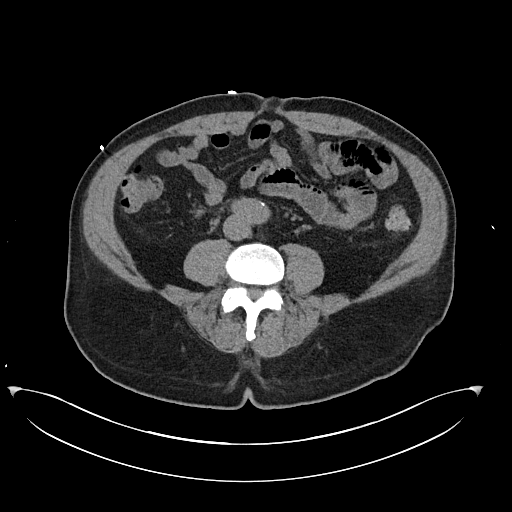
[im 51/101  lung]
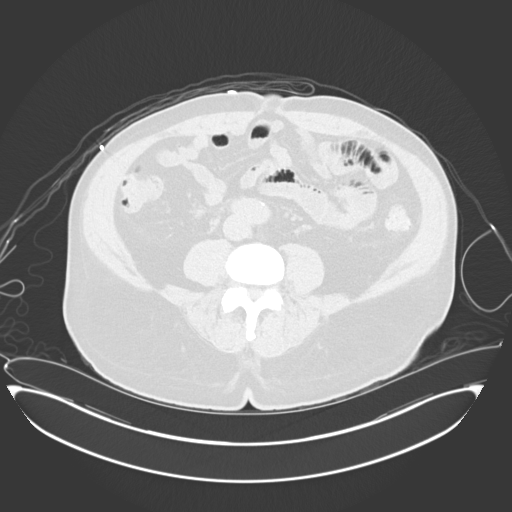
[im 63/101  soft-tissue]
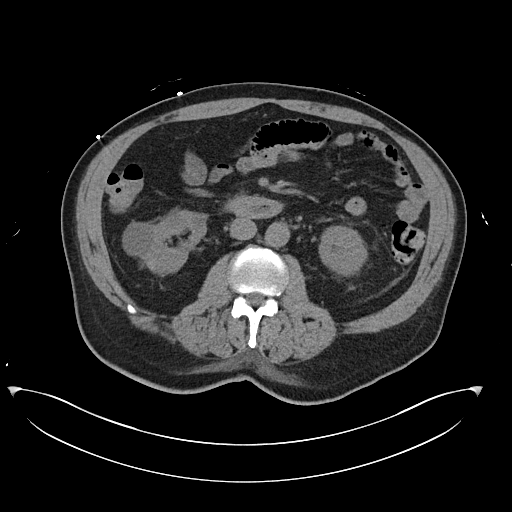
[im 63/101  lung]
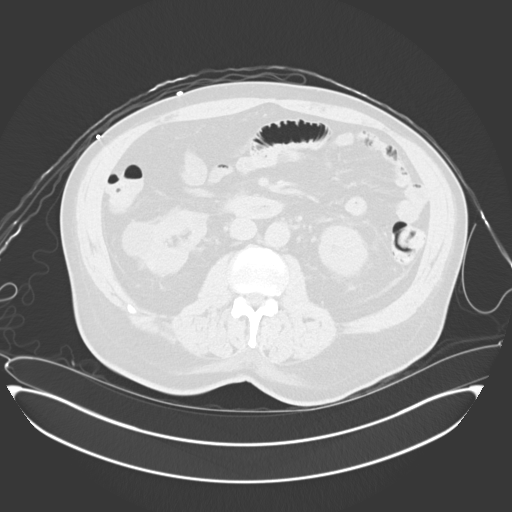
[im 76/101  soft-tissue]
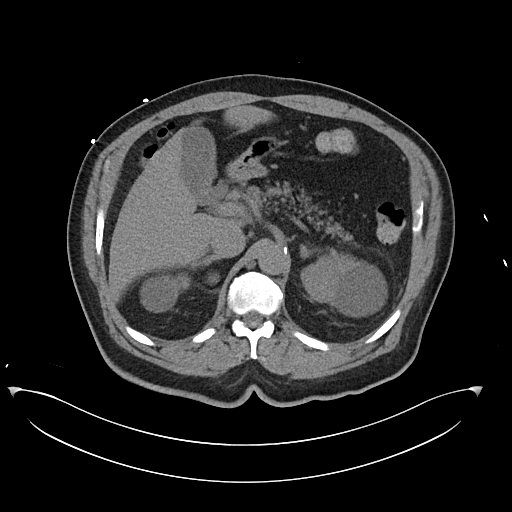
[im 76/101  lung]
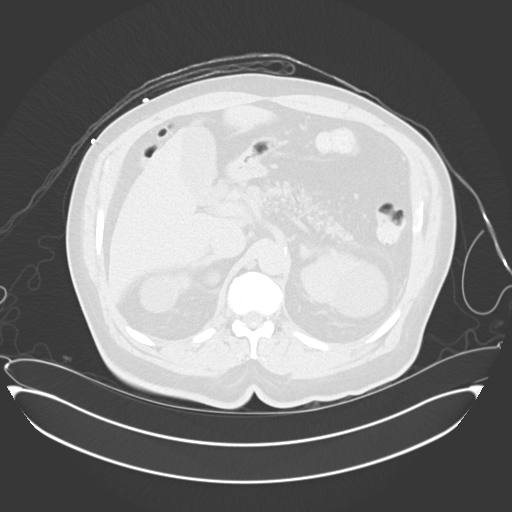
[im 88/101  soft-tissue]
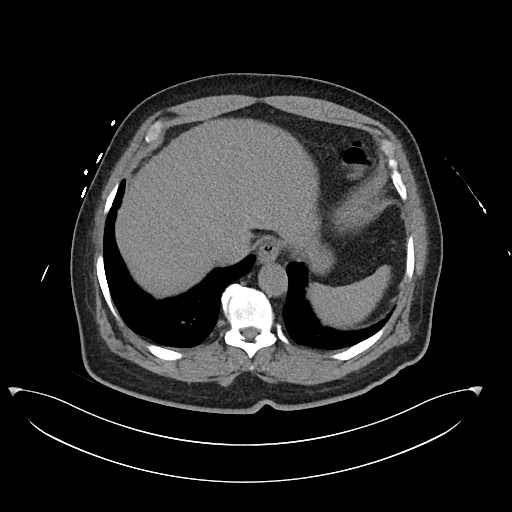
[im 88/101  lung]
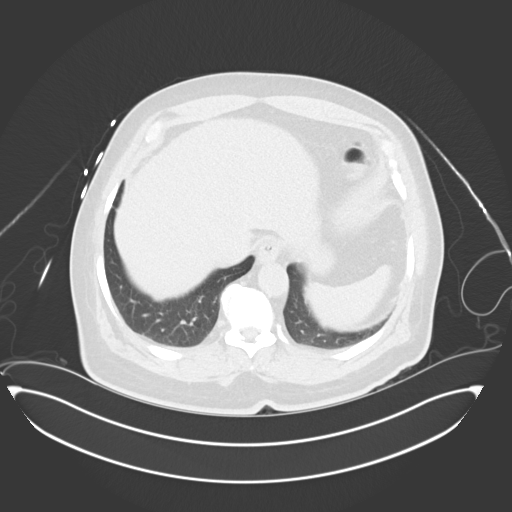

[Series 6: cor · coronal · 0.88mm/px · 3 of 111 slices shown, 4 images]
[im 37/111  soft-tissue]
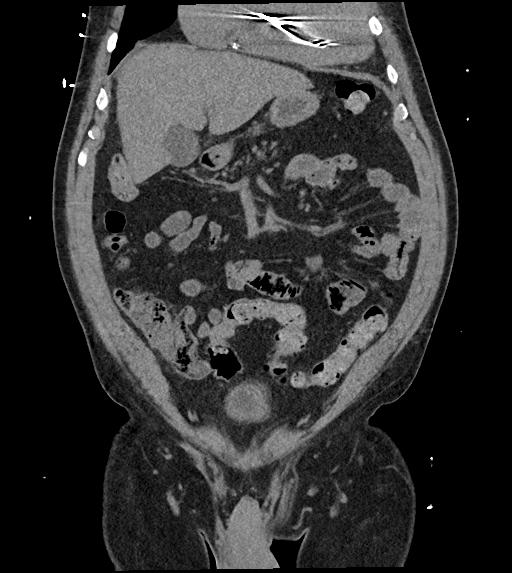
[im 49/111  soft-tissue]
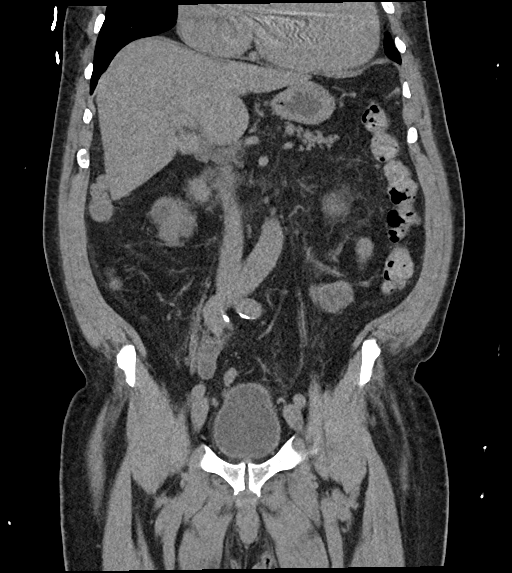
[im 49/111  bone]
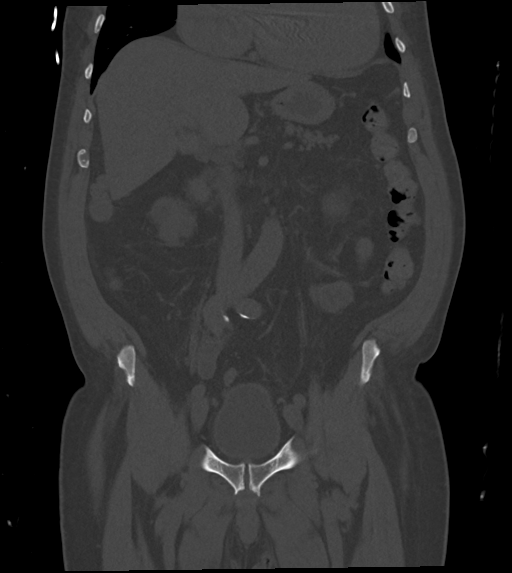
[im 62/111  soft-tissue]
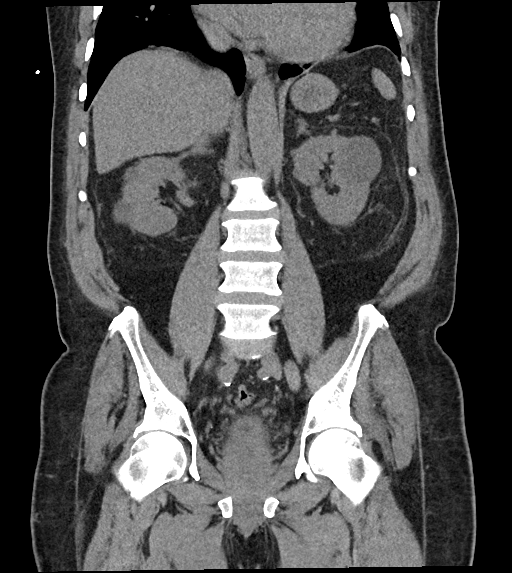

[Series 7: sag · sagittal · 0.66mm/px · 1 of 145 slices shown, 2 images]
[im 49/145  soft-tissue]
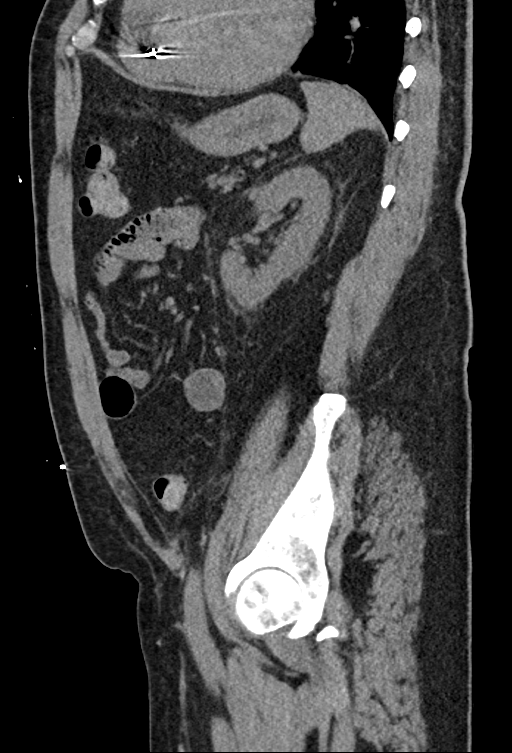
[im 49/145  bone]
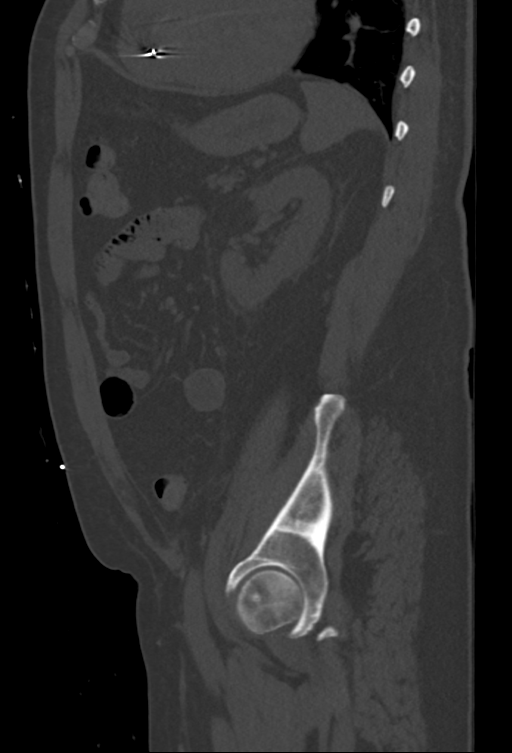

[11 of 46 positions shown; findings below may reference images not displayed]

FINDINGS: Lower chest: Cardiomegaly.

Hepatobiliary: No focal liver abnormality is seen. No gallstones,
gallbladder wall thickening, or biliary dilatation.

Pancreas: Unremarkable. No pancreatic ductal dilatation or
surrounding inflammatory changes.

Spleen: Normal in size without focal abnormality.

Adrenals/Urinary Tract: Adrenals are unremarkable. Multiple renal
cysts. Partially distended bladder is unremarkable.

Stomach/Bowel: Stomach is within normal limits. Bowel is normal in
caliber. Mild to moderate stool burden.

Vascular/Lymphatic: Atherosclerosis.  No enlarged nodes.

Reproductive: Mild enlargement of the prostate indenting the base of
bladder.

Other: No free fluid.  Small fat containing umbilical hernia.

Musculoskeletal: Lower lumbar degenerative changes. No acute osseous
abnormality.
IMPRESSION: No acute abnormality.

Cardiomegaly.

Aortic Atherosclerosis ([id number]).

## 2024-01-20 IMAGING — NM NM SCAN TUMOR LOCALIZE WITH SPECT
2 series · 12 of 12 positions shown · non-contrast
Comparison: none

CLINICAL DATA: HEART FAILURE. CONCERN FOR CARDIAC AMYLOIDOSIS.

EXAM:
NUCLEAR MEDICINE TUMOR LOCALIZATION. PYP CARDIAC AMYLOIDOSIS SCAN
WITH SPECT
TECHNIQUE: Following intravenous administration of radiopharmaceutical,
anterior planar images of the chest were obtained. Regions of
interest were placed on the heart and contralateral chest wall for
quantitative assessment. Additional SPECT imaging of the chest was
obtained.
RADIOPHARMACEUTICALS:  20.4 mCi TECHNETIUM 99 PYROPHOSPHATE

[Series 1: spect - (id)_(id)_tra · 4.1mm · 4.14mm/px · 6 of 128 frames shown]
[frame 11/128]
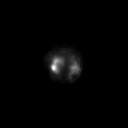
[frame 32/128]
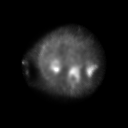
[frame 54/128]
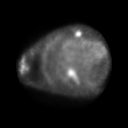
[frame 75/128]
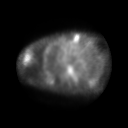
[frame 96/128]
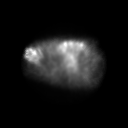
[frame 118/128]
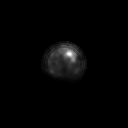

[Series 1: spect - (id)_(id)_cor · 4.1mm · 4.14mm/px · 6 of 128 frames shown]
[frame 11/128]
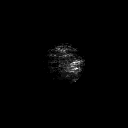
[frame 32/128]
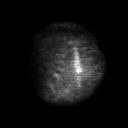
[frame 54/128]
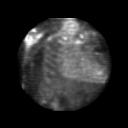
[frame 75/128]
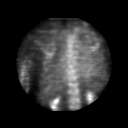
[frame 96/128]
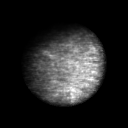
[frame 118/128]
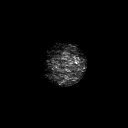

[12 of 12 positions shown; findings below may reference images not displayed]

FINDINGS: Planar Visual assessment:

Anterior planar imaging demonstrates radiotracer uptake within the
heart less than uptake within the adjacent ribs (Grade 1).

Quantitative assessment :

Quantitative assessment of the cardiac uptake compared to the
contralateral chest wall is equal to 1.12 (H/CL = 1.12).

SPECT assessment: SPECT imaging of the chest demonstrates minimal
radiotracer accumulation within the LEFT ventricle.
IMPRESSION: Visual and quantitative assessment (grade 1, H/CL = 1.12) is
equivocal for transthyretin amyloidosis.
